# Patient Record
Sex: Female | Born: 1960 | Race: White | Hispanic: No | Marital: Married | Smoking: Never smoker
Health system: Southern US, Community
[De-identification: ages and names within clinical notes are randomized; demographics above are authoritative.]

## PROBLEM LIST (undated history)

## (undated) DIAGNOSIS — M1990 Unspecified osteoarthritis, unspecified site: Secondary | ICD-10-CM

## (undated) DIAGNOSIS — A048 Other specified bacterial intestinal infections: Secondary | ICD-10-CM

## (undated) DIAGNOSIS — T7840XA Allergy, unspecified, initial encounter: Secondary | ICD-10-CM

## (undated) DIAGNOSIS — J3089 Other allergic rhinitis: Secondary | ICD-10-CM

## (undated) DIAGNOSIS — E1169 Type 2 diabetes mellitus with other specified complication: Secondary | ICD-10-CM

## (undated) DIAGNOSIS — E669 Obesity, unspecified: Secondary | ICD-10-CM

## (undated) DIAGNOSIS — K219 Gastro-esophageal reflux disease without esophagitis: Secondary | ICD-10-CM

## (undated) HISTORY — DX: Obesity, unspecified: E66.9

## (undated) HISTORY — PX: MASTECTOMY: SHX3

## (undated) HISTORY — DX: Type 2 diabetes mellitus with other specified complication: E11.69

## (undated) HISTORY — DX: Other specified bacterial intestinal infections: A04.8

## (undated) HISTORY — PX: WISDOM TOOTH EXTRACTION: SHX21

## (undated) HISTORY — PX: CERVICAL POLYPECTOMY: SHX88

## (undated) HISTORY — DX: Gastro-esophageal reflux disease without esophagitis: K21.9

## (undated) HISTORY — DX: Allergy, unspecified, initial encounter: T78.40XA

## (undated) HISTORY — DX: Unspecified osteoarthritis, unspecified site: M19.90

---

## 2017-11-12 ENCOUNTER — Ambulatory Visit
Admission: EM | Admit: 2017-11-12 | Discharge: 2017-11-12 | Disposition: A | Discharge status: Home / Self Care | Payer: BLUE CROSS/BLUE SHIELD | Attending: Family Medicine | Admitting: Family Medicine

## 2017-11-12 ENCOUNTER — Ambulatory Visit (INDEPENDENT_AMBULATORY_CARE_PROVIDER_SITE_OTHER): Payer: BLUE CROSS/BLUE SHIELD

## 2017-11-12 ENCOUNTER — Other Ambulatory Visit: Payer: Self-pay

## 2017-11-12 DIAGNOSIS — K219 Gastro-esophageal reflux disease without esophagitis: Secondary | ICD-10-CM | POA: Diagnosis not present

## 2017-11-12 DIAGNOSIS — R000 Tachycardia, unspecified: Secondary | ICD-10-CM | POA: Diagnosis not present

## 2017-11-12 DIAGNOSIS — Z79899 Other long term (current) drug therapy: Secondary | ICD-10-CM | POA: Diagnosis not present

## 2017-11-12 DIAGNOSIS — R05 Cough: Secondary | ICD-10-CM

## 2017-11-12 DIAGNOSIS — R0789 Other chest pain: Secondary | ICD-10-CM

## 2017-11-12 DIAGNOSIS — R059 Cough, unspecified: Secondary | ICD-10-CM

## 2017-11-12 MED ORDER — OMEPRAZOLE 20 MG PO CPDR: 20.0000 mg | DELAYED_RELEASE_CAPSULE | Freq: Every day | ORAL | 0 refills | Status: DC

## 2017-11-12 MED ORDER — ALBUTEROL SULFATE HFA 108 (90 BASE) MCG/ACT IN AERS: 2.0000 | INHALATION_SPRAY | Freq: Four times a day (QID) | RESPIRATORY_TRACT | 0 refills | Status: DC | PRN

## 2017-11-12 NOTE — ED Triage Notes (Addendum)
Patient states that 2 saturdays ago she woke up with rectal pain and lasted for 30 minutes and has not returned since.  Patient states that she has noticed callous like areas on her feet that have started to burn at times.  Patient states that she has a history of reflux but has noticed that her reflux has seem to worsen around 2 weeks ago. She has only been taking zantac as needed. Reflux seems to cause a cough, feels like "bronchitis".  Patient also states that her dog has worms and wanted [place] to know in case this could be related.

## 2017-11-12 NOTE — Discharge Instructions (Addendum)
Take medication as prescribed. Rest. Drink plenty of fluids. Monitor for triggers.Take over the counter antihistamine as discussed.   Follow up with your primary care physician as soon as possible. Return to Urgent care as needed.  For any chest pain, shortness of breath, worsening concerns proceed directly to the emergency room.

## 2017-11-12 NOTE — ED Provider Notes (Signed)
[facility] ____________________________________________  Time seen: Approximately 4:45 PM  I have reviewed the triage vital signs and the nursing notes.   HISTORY  Chief Complaint Gastroesophageal Reflux  HPI Terri Tanner is a 57 y.o. female presenting for evaluation of  acid reflux complaints that is been present for the last 3 weeks.  Patient states the current sensation that she has a similar to several years ago when she was diagnosed with acid reflux.  States she was initially supposed to take Zantac every day, but states she does not like to take medicine every day and so she had not been.  States for the last 1.5 weeks has been taken Zantac every day without change in symptoms.  Patient describes current symptom as base of her throat to her lower chest and intermittent transient discomfort that she describes as "bronchial".  Patient states that the discomfort is not a sharp stabbing squeezing ache a pressure or other descriptors.  States no pain or discomfort present at all at this time.  States that she does belch frequently and states that that is been chronic without acute change.  Reports that she also does have what she describes as a chronic nonproductive hacking cough that is been present for years but has slightly increased over the last few weeks.  Also reports over the last few weeks some increase postnasal drainage particularly in the morning with an occasional wheeze that she hears first thing in the morning.  Denies any other wheezing.  No associated chest pain or shortness of breath.  States that this is similar to previous diagnosis of acid reflux.  States 2 years ago she was evaluated for acid reflux and had a negative cardiology evaluation including a negative stress test.  Not a smoker.  Denies asthma or COPD. Denies chest pain, fevers, shortness of breath, abdominal pain, or rash. Denies recent sickness. Denies recent antibiotic use.  Reports continues to eat  and drink well.  Again denies any complaints at this time.  Does report over the last few months she has had increased stress that she recently moved to [place] and is living with her daughter until her husband moves down, and states she is now having to homeschool her grandchild.  PCP: [facility]    History reviewed. No pertinent past medical history.  There are no active problems to display for this patient.   Past Surgical History:  Procedure Laterality Date  . CESAREAN SECTION    . WISDOM TOOTH EXTRACTION       No current facility-administered medications for this encounter.   Current Outpatient Medications:  .  cholecalciferol (VITAMIN D) 1000 units tablet, Take 1,000 Units by mouth daily., Disp: , Rfl:  .  ranitidine (ZANTAC) 150 MG tablet, Take 150 mg by mouth 2 (two) times daily., Disp: , Rfl:  .  albuterol (PROVENTIL HFA;VENTOLIN HFA) 108 (90 Base) MCG/ACT inhaler, Inhale 2 puffs into the lungs every 6 (six) hours as needed for wheezing., Disp: 1 Inhaler, Rfl: 0 .  omeprazole (PRILOSEC) 20 MG capsule, Take 1 capsule (20 mg total) by mouth daily., Disp: 30 capsule, Rfl: 0  Allergies Patient has no known allergies.  Family History  Problem Relation Age of Onset  . Lung cancer Mother   . Esophageal cancer Father     Social History Social History   Tobacco Use  . Smoking status: Never Smoker  . Smokeless tobacco: Never Used  Substance Use Topics  . Alcohol use: Not Currently  Comment: rarely  . Drug use: Never    Review of Systems Constitutional: No fever ENT: No sore throat. Cardiovascular: Denies chest pain. AS above.  Respiratory: Denies shortness of breath. Gastrointestinal: No abdominal pain.  No nausea, no vomiting.  No diarrhea.  No constipation. Denies bloody stools.  Musculoskeletal: Negative for back pain. Skin: Negative for rash. Neurological: Negative for headaches, focal weakness or  numbness.   ____________________________________________   PHYSICAL EXAM:  VITAL SIGNS: ED Triage Vitals  Enc Vitals Group     BP 11/12/17 1628 133/86     Pulse Rate 11/12/17 1628 (!) 111     Resp 11/12/17 1628 18     Temp 11/12/17 1628 98.6 F (37 C)     Temp Source 11/12/17 1628 Oral     SpO2 11/12/17 1628 98 %     Weight --      Height 11/12/17 1625 5\' 1"  (1.549 m)     Head Circumference --      Peak Flow --      Pain Score 11/12/17 1625 3     Pain Loc --      Pain Edu? --      Excl. in [place]? --    Constitutional: Alert and oriented. Well appearing and in no acute distress. Eyes: Conjunctivae are normal.  Head: Atraumatic. No sinus tenderness to palpation. No swelling. No erythema.  Ears: no erythema, normal TMs bilaterally.   Nose:No nasal congestion   Mouth/Throat: Mucous membranes are moist. No pharyngeal erythema. No tonsillar swelling or exudate.  Neck: No stridor.  No cervical spine tenderness to palpation. Hematological/Lymphatic/Immunilogical: No cervical lymphadenopathy. Cardiovascular: Normal rate, regular rhythm. Grossly normal heart sounds.  Good peripheral circulation. Respiratory: Normal respiratory effort.  No retractions. No wheezes, rales or rhonchi. Good air movement.  Gastrointestinal: Soft and nontender.No CVA tenderness. Musculoskeletal: Ambulatory with steady gait. No cervical, thoracic or lumbar tenderness to palpation. Chest nontender to palpation.  Neurologic:  Normal speech and language. No gait instability. Skin:  Skin appears warm, dry and intact. No rash noted. Psychiatric: Mood and affect are normal. Speech and behavior are normal.  ___________________________________________   LABS (all labs ordered are listed, but only abnormal results are displayed)  Labs Reviewed - No data to display ____________________________________________  EKG ED ECG REPORT I, Marylene Land, the attending provider, personally viewed and interpreted this  ECG.   Date: 11/12/2017  EKG Time: 1706  Rate: 104  Rhythm: sinus tachycardia  Axis: normal  Intervals:none  ST&T Change: none  RADIOLOGY  Dg Chest 2 View  Result Date: 11/12/2017 CLINICAL DATA:  Cough. EXAM: CHEST - 2 VIEW COMPARISON:  None. FINDINGS: The heart size and mediastinal contours are within normal limits. Both lungs are clear. No pneumothorax or pleural effusion is noted. The visualized skeletal structures are unremarkable. IMPRESSION: No active cardiopulmonary disease. Electronically Signed   By: Marijo Conception, M.D.   On: 11/12/2017 17:20   ____________________________________________   PROCEDURES Procedures    INITIAL IMPRESSION / ASSESSMENT AND PLAN / ED COURSE  Pertinent labs & imaging results that were available during my care of the patient were reviewed by me and considered in my medical decision making (see chart for details).  Well-appearing patient.  Patient reports vague description of transient complaints.  Patient denies any complaints at this time.  EKG  reviewed, chest x-ray negative.  Suspect combination of symptoms of acid reflux, allergies and stress.  Discussed close monitoring and for any worsening complaints proceed directly to  the emergency room.  Recommend close follow-up with primary care.  Will start on oral daily omeprazole and encourage over-the-counter Claritin.  Rx for albuterol given as needed for wheeze.  Encourage rest, fluids, supportive care.Discussed indication, risks and benefits of medications with patient.  Discussed follow up with Primary care physician this week. Discussed follow up and return parameters including no resolution or any worsening concerns. Patient verbalized understanding and agreed to plan.   ____________________________________________   FINAL CLINICAL IMPRESSION(S) / ED DIAGNOSES  Final diagnoses:  Cough  Gastroesophageal reflux disease, esophagitis presence not specified     ED Discharge Orders         Ordered    omeprazole (PRILOSEC) 20 MG capsule  Daily     11/12/17 1734    albuterol (PROVENTIL HFA;VENTOLIN HFA) 108 (90 Base) MCG/ACT inhaler  Every 6 hours PRN     11/12/17 1734       Note: This dictation was prepared with Dragon dictation along with smaller phrase technology. Any transcriptional errors that result from this process are unintentional.         Marylene Land, NP 11/12/17 1830

## 2018-02-04 ENCOUNTER — Encounter

## 2018-02-04 ENCOUNTER — Encounter: Payer: Self-pay | Admitting: Family Medicine

## 2018-02-04 ENCOUNTER — Ambulatory Visit (INDEPENDENT_AMBULATORY_CARE_PROVIDER_SITE_OTHER): Payer: BLUE CROSS/BLUE SHIELD | Admitting: Family Medicine

## 2018-02-04 VITALS — BP 108/64 | HR 100 | Temp 98.5°F | Resp 16 | Ht 62.0 in | Wt 173.4 lb

## 2018-02-04 DIAGNOSIS — K219 Gastro-esophageal reflux disease without esophagitis: Secondary | ICD-10-CM | POA: Insufficient documentation

## 2018-02-04 DIAGNOSIS — E669 Obesity, unspecified: Secondary | ICD-10-CM | POA: Diagnosis not present

## 2018-02-04 DIAGNOSIS — Z1159 Encounter for screening for other viral diseases: Secondary | ICD-10-CM

## 2018-02-04 DIAGNOSIS — R053 Chronic cough: Secondary | ICD-10-CM | POA: Insufficient documentation

## 2018-02-04 DIAGNOSIS — Z131 Encounter for screening for diabetes mellitus: Secondary | ICD-10-CM

## 2018-02-04 DIAGNOSIS — R05 Cough: Secondary | ICD-10-CM | POA: Diagnosis not present

## 2018-02-04 DIAGNOSIS — Z79899 Other long term (current) drug therapy: Secondary | ICD-10-CM

## 2018-02-04 DIAGNOSIS — Z1239 Encounter for other screening for malignant neoplasm of breast: Secondary | ICD-10-CM

## 2018-02-04 DIAGNOSIS — Z1231 Encounter for screening mammogram for malignant neoplasm of breast: Secondary | ICD-10-CM

## 2018-02-04 DIAGNOSIS — L84 Corns and callosities: Secondary | ICD-10-CM

## 2018-02-04 DIAGNOSIS — R059 Cough, unspecified: Secondary | ICD-10-CM

## 2018-02-04 DIAGNOSIS — E559 Vitamin D deficiency, unspecified: Secondary | ICD-10-CM

## 2018-02-04 DIAGNOSIS — Z13220 Encounter for screening for lipoid disorders: Secondary | ICD-10-CM

## 2018-02-04 DIAGNOSIS — Z23 Encounter for immunization: Secondary | ICD-10-CM | POA: Diagnosis not present

## 2018-02-04 MED ORDER — OMEPRAZOLE 20 MG PO CPDR: 20.0000 mg | DELAYED_RELEASE_CAPSULE | Freq: Every day | ORAL | 1 refills | Status: DC

## 2018-02-04 NOTE — Patient Instructions (Signed)

## 2018-02-04 NOTE — Progress Notes (Signed)
Name: Terri Tanner   MRN: [id number]    DOB: 1961-05-30   Date:02/04/2018       Progress Note  Subjective  Chief Complaint  Chief Complaint  Patient presents with  . Establish Care  . Labs Only    patient is not fasting    HPI  GERD: seen at Urgent Care, doing well on Omeprazole, no side effects, no heartburn, regurgitation or chest pain since started on medication. No weight loss, states had colonoscopy about 9 years ago and is due for repeat next year. No blood in stools.   Obesity: she states going out to eat more often, and thinks she has gained about 25 lbs in the past 6 months ago. Discussed life style modification   Left Foot Problems: going on for a couple of years on left foot only, around 5th toe, and two lesions on bottom of left foot, no pain associated, no oozing, gets better when she buffers the area. She would like to see podiatrist   Vitamin D deficiency: taking 4000 units daily.   Patient Active Problem List   Diagnosis Date Noted  . GERD (gastroesophageal reflux disease) 02/04/2018  . Vitamin D deficiency 02/04/2018    Past Surgical History:  Procedure Laterality Date  . CERVICAL POLYPECTOMY    . CESAREAN SECTION    . WISDOM TOOTH EXTRACTION    . WISDOM TOOTH EXTRACTION      Family History  Problem Relation Age of Onset  . Lung cancer Mother        smoked for many years  . COPD Mother   . Emphysema Mother   . Esophageal cancer Father        smoker  . Suicidality Father   . Stroke Brother   . Alcohol abuse Brother   . Other Brother        huge smoker  . Diabetes Sister     Social History   Socioeconomic History  . Marital status: Married    Spouse name: Alvester Chou  . Number of children: 1  . Years of education: Not on file  . Highest education level: High school graduate  Occupational History  . Occupation: stay at home   Social Needs  . Financial resource strain: Not hard at all  . Food insecurity:    Worry: Never true    Inability:  Never true  . Transportation needs:    Medical: No    Non-medical: No  Tobacco Use  . Smoking status: Never Smoker  . Smokeless tobacco: Never Used  Substance and Sexual Activity  . Alcohol use: Not Currently    Comment: rarely  . Drug use: Never  . Sexual activity: Not Currently    Partners: Male    Birth control/protection: None, Surgical  Lifestyle  . Physical activity:    Days per week: 0 days    Minutes per session: 0 min  . Stress: Not at all  Relationships  . Social connections:    Talks on phone: More than three times a week    Gets together: More than three times a week    Attends religious service: More than 4 times per year    Active member of club or organization: Yes    Attends meetings of clubs or organizations: 1 to 4 times per year    Relationship status: Married  . Intimate partner violence:    Fear of current or ex partner: No    Emotionally abused: No    Physically  abused: No    Forced sexual activity: No  Other Topics Concern  . Not on file  Social History Narrative   Patient  moved here from [place], [place] 07/2017   They moved in with their only daughter to home school the grandchildren     Current Outpatient Medications:  .  cholecalciferol (VITAMIN D) 1000 units tablet, Take 1,000 Units by mouth daily., Disp: , Rfl:  .  loratadine (CLARITIN) 10 MG tablet, Take 10 mg by mouth daily., Disp: , Rfl:  .  omeprazole (PRILOSEC) 20 MG capsule, Take 1 capsule (20 mg total) by mouth daily., Disp: 90 capsule, Rfl: 1  No Known Allergies   ROS  Constitutional: Negative for fever or weight change.  Respiratory: positive for cough but no  shortness of breath.   Cardiovascular: Negative for chest pain or palpitations.  Gastrointestinal: Negative for abdominal pain, no bowel changes.  Musculoskeletal: Negative for gait problem or joint swelling.  Skin: problems with foot  Neurological: Negative for dizziness or headache.  No other specific complaints  in a complete review of systems (except as listed in HPI above).  Objective  Vitals:   02/04/18 1139  BP: 108/64  Pulse: 100  Resp: 16  Temp: 98.5 F (36.9 C)  TempSrc: Oral  SpO2: 98%  Weight: 173 lb 6.4 oz (78.7 kg)  Height: 5\' 2"  (1.575 m)    Body mass index is 31.72 kg/m.  Physical Exam  Constitutional: Patient appears well-developed and well-nourished. Obese No distress.  HEENT: head atraumatic, normocephalic, pupils equal and reactive to light,  neck supple, throat within normal limits Cardiovascular: Normal rate, regular rhythm and normal heart sounds.  No murmur heard. No BLE edema. Pulmonary/Chest: Effort normal and breath sounds normal. No respiratory distress. Abdominal: Soft.  There is no tenderness. Skin: dry skin on left toe, wart like and callus type lesions on plantar aspect of left foot  Psychiatric: Patient has a normal mood and affect. behavior is normal. Judgment and thought content normal.  PHQ2/9: Depression screen PHQ 2/9 02/04/2018  Decreased Interest 0  Down, Depressed, Hopeless 0  PHQ - 2 Score 0  Altered sleeping 0  Tired, decreased energy 2  Change in appetite 1  Feeling bad or failure about yourself  0  Trouble concentrating 0  Moving slowly or fidgety/restless 0  Suicidal thoughts 0  PHQ-9 Score 3  Difficult doing work/chores Not difficult at all     Fall Risk: Fall Risk  02/04/2018  Falls in the past year? No    Functional Status Survey: Is the patient deaf or have difficulty hearing?: No Does the patient have difficulty seeing, even when wearing glasses/contacts?: No Does the patient have difficulty concentrating, remembering, or making decisions?: No Does the patient have difficulty walking or climbing stairs?: No Does the patient have difficulty dressing or bathing?: No Does the patient have difficulty doing errands alone such as visiting a doctor's office or shopping?: No    Assessment & Plan   1. Gastroesophageal  reflux disease without esophagitis  Discussed long term use of PPI and she will try to incorporate life style modification and wean self off medication  - omeprazole (PRILOSEC) 20 MG capsule; Take 1 capsule (20 mg total) by mouth daily.  Dispense: 90 capsule; Refill: 1  2. Cough in adult  Normal CXR, chronic, daughter of smokers, never smoked herself, only in ams, no sputum. Check CBC and monitor for now - CBC with Differential/Platelet Try stopping loratadine since only symptoms is  cough, and she never filled ventolin   3. Obesity (BMI 30.0-34.9)  Discussed with the patient the risk posed by an increased BMI. Discussed importance of portion control, calorie counting and at least 150 minutes of physical activity weekly. Avoid sweet beverages and drink more water. Eat at least 6 servings of fruit and vegetables daily   4. Need for Tdap vaccination  refused  5. Need for hepatitis C screening test  - Hepatitis C Antibody  6. Breast cancer screening  - MM 3D SCREEN BREAST BILATERAL; Future  7. Long-term use of high-risk medication  - COMPLETE METABOLIC PANEL WITH GFR - CBC with Differential/Platelet  8. Diabetes mellitus screening  - Hemoglobin A1c  9. Lipid screening  - Lipid panel  10. Vitamin D deficiency  - VITAMIN D 25 Hydroxy (Vit-D Deficiency, Fractures)   11. Foot callus  - Ambulatory referral to Podiatry

## 2018-02-05 LAB — COMPLETE METABOLIC PANEL WITH GFR
AG Ratio: 1.6 (calc) (ref 1.0–2.5)
ALBUMIN MSPROF: 4.4 g/dL (ref 3.6–5.1)
ALKALINE PHOSPHATASE (APISO): 95 U/L (ref 33–130)
ALT: 47 U/L — ABNORMAL HIGH (ref 6–29)
AST: 33 U/L (ref 10–35)
BILIRUBIN TOTAL: 0.4 mg/dL (ref 0.2–1.2)
BUN: 17 mg/dL (ref 7–25)
CHLORIDE: 101 mmol/L (ref 98–110)
CO2: 28 mmol/L (ref 20–32)
CREATININE: 0.81 mg/dL (ref 0.50–1.05)
Calcium: 10.1 mg/dL (ref 8.6–10.4)
GFR, EST AFRICAN AMERICAN: 93 mL/min/{1.73_m2} (ref >=60)
GFR, Est Non African American: 80 mL/min/{1.73_m2} (ref >=60)
GLOBULIN: 2.8 g/dL (ref 1.9–3.7)
GLUCOSE: 117 mg/dL (ref 65–139)
Potassium: 4.2 mmol/L (ref 3.5–5.3)
SODIUM: 137 mmol/L (ref 135–146)
TOTAL PROTEIN: 7.2 g/dL (ref 6.1–8.1)

## 2018-02-05 LAB — CBC WITH DIFFERENTIAL/PLATELET
Basophils Absolute: 72 cells/uL (ref 0–200)
Basophils Relative: 0.9 %
Eosinophils Absolute: 160 cells/uL (ref 15–500)
Eosinophils Relative: 2 %
HEMATOCRIT: 45 % (ref 35.0–45.0)
Hemoglobin: 15.3 g/dL (ref 11.7–15.5)
LYMPHS ABS: 2160 {cells}/uL (ref 850–3900)
MCH: 29.6 pg (ref 27.0–33.0)
MCHC: 34 g/dL (ref 32.0–36.0)
MCV: 87 fL (ref 80.0–100.0)
MPV: 11.5 fL (ref 7.5–12.5)
Monocytes Relative: 8.2 %
NEUTROS PCT: 61.9 %
Neutro Abs: 4952 cells/uL (ref 1500–7800)
Platelets: 271 10*3/uL (ref 140–400)
RBC: 5.17 10*6/uL — ABNORMAL HIGH (ref 3.80–5.10)
RDW: 12.5 % (ref 11.0–15.0)
TOTAL LYMPHOCYTE: 27 %
WBC: 8 10*3/uL (ref 3.8–10.8)
WBCMIX: 656 {cells}/uL (ref 200–950)

## 2018-02-05 LAB — LIPID PANEL
CHOLESTEROL: 270 mg/dL — AB (ref <200)
HDL: 46 mg/dL — ABNORMAL LOW (ref >50)
Non-HDL Cholesterol (Calc): 224 mg/dL (calc) — ABNORMAL HIGH (ref <130)
TRIGLYCERIDES: 492 mg/dL — AB (ref <150)
Total CHOL/HDL Ratio: 5.9 (calc) — ABNORMAL HIGH (ref <5.0)

## 2018-02-05 LAB — VITAMIN D 25 HYDROXY (VIT D DEFICIENCY, FRACTURES): VIT D 25 HYDROXY: 45 ng/mL (ref 30–100)

## 2018-02-05 LAB — HEMOGLOBIN A1C
EAG (MMOL/L): 8.2 (calc)
Hgb A1c MFr Bld: 6.8 % of total Hgb — ABNORMAL HIGH (ref <5.7)
MEAN PLASMA GLUCOSE: 148 (calc)

## 2018-02-05 LAB — HEPATITIS C ANTIBODY
HEP C AB: NONREACTIVE
SIGNAL TO CUT-OFF: 0.01 (ref <1.00)

## 2018-02-06 ENCOUNTER — Encounter: Payer: Self-pay | Admitting: Family Medicine

## 2018-02-06 DIAGNOSIS — E1169 Type 2 diabetes mellitus with other specified complication: Secondary | ICD-10-CM | POA: Insufficient documentation

## 2018-02-06 DIAGNOSIS — E785 Hyperlipidemia, unspecified: Secondary | ICD-10-CM

## 2018-02-06 DIAGNOSIS — E669 Obesity, unspecified: Secondary | ICD-10-CM | POA: Insufficient documentation

## 2018-02-06 HISTORY — DX: Type 2 diabetes mellitus with other specified complication: E11.69

## 2018-02-06 HISTORY — DX: Type 2 diabetes mellitus with other specified complication: E66.9

## 2018-02-09 ENCOUNTER — Encounter: Payer: Self-pay | Admitting: Family Medicine

## 2018-02-09 ENCOUNTER — Ambulatory Visit: Payer: BLUE CROSS/BLUE SHIELD | Admitting: Family Medicine

## 2018-02-09 VITALS — BP 116/74 | HR 102 | Temp 98.5°F | Resp 18 | Ht 62.0 in | Wt 168.2 lb

## 2018-02-09 DIAGNOSIS — R059 Cough, unspecified: Secondary | ICD-10-CM

## 2018-02-09 DIAGNOSIS — E1169 Type 2 diabetes mellitus with other specified complication: Secondary | ICD-10-CM | POA: Diagnosis not present

## 2018-02-09 DIAGNOSIS — R05 Cough: Secondary | ICD-10-CM

## 2018-02-09 DIAGNOSIS — E785 Hyperlipidemia, unspecified: Secondary | ICD-10-CM

## 2018-02-09 DIAGNOSIS — Z23 Encounter for immunization: Secondary | ICD-10-CM

## 2018-02-09 DIAGNOSIS — E669 Obesity, unspecified: Secondary | ICD-10-CM

## 2018-02-09 LAB — POCT UA - MICROALBUMIN: Microalbumin Ur, POC: 20 mg/L

## 2018-02-09 MED ORDER — ROSUVASTATIN CALCIUM 20 MG PO TABS: 20.0000 mg | ORAL_TABLET | Freq: Every day | ORAL | 2 refills | Status: DC

## 2018-02-09 NOTE — Progress Notes (Signed)
Name: Terri Tanner   MRN: [id number]    DOB: [birth year]   Date:02/09/2018       Progress Note  Subjective  Chief Complaint  Chief Complaint  Patient presents with  . Labs Only    To discuss blood work results    HPI  Diabetes Type II with obesity and dyslipidemia: she has nocturia usually once for the past few years, but denies frequency, polyphagia or polydipsia. She has changed her diet since she got lab results last week. She has lost a few pounds and is motivated to continue life style modification prior to starting medications. Discussed lab results today with her, explained the dyslipidemia and importance to start aspirin 81 mg and also Crestor. Discussed possible side effects, we will monitor triglycerides level since it should improve when diabetes improves. She states oldest sister had DM. Eye exam is already scheduled for Nov   Chronic cough: weaning self off omeprazole since is not helping and she does not have any other symptoms of reflux, she also stopped loratadine, she does not have diagnosis of asthma or COPD. She was second smoker ( father and mother). Cough is usually in am's and occasionally after a meals, she feels like something is stuck in her throat, she coughs and clears her throat and resolves, never productive, CXR done recently was normal, we will check spirometry    Patient Active Problem List   Diagnosis Date Noted  . Diabetes mellitus type 2 in obese ([place]) 02/06/2018  . GERD (gastroesophageal reflux disease) 02/04/2018  . Vitamin D deficiency 02/04/2018  . Chronic cough 02/04/2018  . Obesity (BMI 30-39.9) 02/04/2018    Past Surgical History:  Procedure Laterality Date  . CERVICAL POLYPECTOMY    . CESAREAN SECTION    . WISDOM TOOTH EXTRACTION    . WISDOM TOOTH EXTRACTION      Family History  Problem Relation Age of Onset  . Lung cancer Mother        smoked for many years  . COPD Mother   . Emphysema Mother   . Esophageal cancer Father    smoker  . Suicidality Father   . Stroke Brother   . Alcohol abuse Brother   . Other Brother        huge smoker  . Diabetes Sister     Social History   Socioeconomic History  . Marital status: Married    Spouse name: Alvester Chou  . Number of children: 1  . Years of education: Not on file  . Highest education level: High school graduate  Occupational History  . Occupation: stay at home   Social Needs  . Financial resource strain: Not hard at all  . Food insecurity:    Worry: Never true    Inability: Never true  . Transportation needs:    Medical: No    Non-medical: No  Tobacco Use  . Smoking status: Never Smoker  . Smokeless tobacco: Never Used  Substance and Sexual Activity  . Alcohol use: Not Currently    Comment: rarely  . Drug use: Never  . Sexual activity: Not Currently    Partners: Male    Birth control/protection: None, Surgical  Lifestyle  . Physical activity:    Days per week: 0 days    Minutes per session: 0 min  . Stress: Not at all  Relationships  . Social connections:    Talks on phone: More than three times a week    Gets together: More than three times  a week    Attends religious service: More than 4 times per year    Active member of club or organization: Yes    Attends meetings of clubs or organizations: 1 to 4 times per year    Relationship status: Married  . Intimate partner violence:    Fear of current or ex partner: No    Emotionally abused: No    Physically abused: No    Forced sexual activity: No  Other Topics Concern  . Not on file  Social History Narrative   Patient  moved here from [place], [place] 07/2017   They moved in with their only daughter to home school the grandchildren     Current Outpatient Medications:  .  cholecalciferol (VITAMIN D) 1000 units tablet, Take 1,000 Units by mouth daily., Disp: , Rfl:  .  rosuvastatin (CRESTOR) 20 MG tablet, Take 1 tablet (20 mg total) by mouth daily. Start with half first week after that  one pill, Disp: 30 tablet, Rfl: 2  No Known Allergies   ROS  Constitutional: Negative for fever , positive for weight change.  Respiratory: positive  for cough , but no shortness of breath.   Cardiovascular: Intermittent  for chest pain ( evaluated 2 years ago with normal stress test) described as soreness on right side,but can move around anterior chest but no  palpitations. EKG done at urgent care this year Gastrointestinal: Negative for abdominal pain, no bowel changes.  Musculoskeletal: Negative for gait problem or joint swelling.  Skin: Negative for rash.  Neurological: Negative for dizziness or headache.  No other specific complaints in a complete review of systems (except as listed in HPI above).  Objective  Vitals:   02/09/18 1052  BP: 116/74  Pulse: (!) 102  Resp: 18  Temp: 98.5 F (36.9 C)  TempSrc: Oral  SpO2: 99%  Weight: 168 lb 3.2 oz (76.3 kg)  Height: 5\' 2"  (1.575 m)    Body mass index is 30.76 kg/m.  Physical Exam  Constitutional: Patient appears well-developed and well-nourished. Obese No distress.  HEENT: head atraumatic, normocephalic, pupils equal and reactive to light, neck supple, throat within normal limits Cardiovascular: Normal rate, regular rhythm and normal heart sounds.  No murmur heard. No BLE edema. Pulmonary/Chest: Effort normal and breath sounds normal. No respiratory distress. Abdominal: Soft.  There is no tenderness. Psychiatric: Patient has a normal mood and affect. behavior is normal. Judgment and thought content normal.  Recent Results (from the past 2160 hour(s))  Hemoglobin A1c     Status: Abnormal   Collection Time: 02/04/18 12:40 PM  Result Value Ref Range   Hgb A1c MFr Bld 6.8 (H) <5.7 % of total Hgb    Comment: For someone without known diabetes, a hemoglobin A1c value of 6.5% or greater indicates that they may have  diabetes and this should be confirmed with a follow-up  test. . For someone with known diabetes, a value  <7% indicates  that their diabetes is well controlled and a value  greater than or equal to 7% indicates suboptimal  control. A1c targets should be individualized based on  duration of diabetes, age, comorbid conditions, and  other considerations. . Currently, no consensus exists regarding use of hemoglobin A1c for diagnosis of diabetes for children. .    Mean Plasma Glucose 148 (calc)   eAG (mmol/L) 8.2 (calc)  Lipid panel     Status: Abnormal   Collection Time: 02/04/18 12:40 PM  Result Value Ref Range   Cholesterol  270 (H) <200 mg/dL   HDL 46 (L) >50 mg/dL   Triglycerides 492 (H) <150 mg/dL    Comment: . If a non-fasting specimen was collected, consider repeat triglyceride testing on a fasting specimen if clinically indicated.  Yates Decamp et al. J. of Clin. Lipidol. 1610;9:604-540. Marland Kitchen    LDL Cholesterol (Calc)  mg/dL (calc)    Comment: . LDL cholesterol not calculated. Triglyceride levels greater than 400 mg/dL invalidate calculated LDL results. . Reference range: <100 . Desirable range <100 mg/dL for primary prevention;   <70 mg/dL for patients with CHD or diabetic patients  with > or = 2 CHD risk factors. Marland Kitchen LDL-C is now calculated using the Martin-Hopkins  calculation, which is a validated novel method providing  better accuracy than the Friedewald equation in the  estimation of LDL-C.  Cresenciano Genre et al. Annamaria Helling. 9811;914(78): 2061-2068  (http://education.[facility].com/faq/FAQ164)    Total CHOL/HDL Ratio 5.9 (H) <5.0 (calc)   Non-HDL Cholesterol (Calc) 224 (H) <130 mg/dL (calc)    Comment: Non-HDL level > or = 220 is very high and may indicate  genetic familial hypercholesterolemia (FH). Clinical  assessment and measurement of blood lipid levels  should be considered for all first-degree relatives  of patients with an FH diagnosis. . For patients with diabetes plus 1 major ASCVD risk  factor, treating to a non-HDL-C goal of <100 mg/dL  (LDL-C of <70 mg/dL)  is considered a therapeutic  option.   COMPLETE METABOLIC PANEL WITH GFR     Status: Abnormal   Collection Time: 02/04/18 12:40 PM  Result Value Ref Range   Glucose, Bld 117 65 - 139 mg/dL    Comment: .        Non-fasting reference interval .    BUN 17 7 - 25 mg/dL   Creat 0.81 0.50 - 1.05 mg/dL    Comment: For patients >57 years of age, the reference limit for Creatinine is approximately 13% higher for people identified as African-American. .    GFR, Est Non African American 80 > OR = 60 mL/min/1.14m2   GFR, Est African American 93 > OR = 60 mL/min/1.24m2   BUN/Creatinine Ratio NOT APPLICABLE 6 - 22 (calc)   Sodium 137 135 - 146 mmol/L   Potassium 4.2 3.5 - 5.3 mmol/L   Chloride 101 98 - 110 mmol/L   CO2 28 20 - 32 mmol/L   Calcium 10.1 8.6 - 10.4 mg/dL   Total Protein 7.2 6.1 - 8.1 g/dL   Albumin 4.4 3.6 - 5.1 g/dL   Globulin 2.8 1.9 - 3.7 g/dL (calc)   AG Ratio 1.6 1.0 - 2.5 (calc)   Total Bilirubin 0.4 0.2 - 1.2 mg/dL   Alkaline phosphatase (APISO) 95 33 - 130 U/L   AST 33 10 - 35 U/L   ALT 47 (H) 6 - 29 U/L  CBC with Differential/Platelet     Status: Abnormal   Collection Time: 02/04/18 12:40 PM  Result Value Ref Range   WBC 8.0 3.8 - 10.8 Thousand/uL   RBC 5.17 (H) 3.80 - 5.10 Million/uL   Hemoglobin 15.3 11.7 - 15.5 g/dL   HCT 45.0 35.0 - 45.0 %   MCV 87.0 80.0 - 100.0 fL   MCH 29.6 27.0 - 33.0 pg   MCHC 34.0 32.0 - 36.0 g/dL   RDW 12.5 11.0 - 15.0 %   Platelets 271 140 - 400 Thousand/uL   MPV 11.5 7.5 - 12.5 fL   Neutro Abs 4,952 1,500 - 7,800 cells/uL  Lymphs Abs 2,160 850 - 3,900 cells/uL   WBC mixed population 656 200 - 950 cells/uL   Eosinophils Absolute 160 15 - 500 cells/uL   Basophils Absolute 72 0 - 200 cells/uL   Neutrophils Relative % 61.9 %   Total Lymphocyte 27.0 %   Monocytes Relative 8.2 %   Eosinophils Relative 2.0 %   Basophils Relative 0.9 %  Hepatitis C Antibody     Status: None   Collection Time: 02/04/18 12:40 PM  Result Value  Ref Range   Hepatitis C Ab NON-REACTIVE NON-REACTI   SIGNAL TO CUT-OFF 0.01 <1.00    Comment: . HCV antibody was non-reactive. There is no laboratory  evidence of HCV infection. . In most cases, no further action is required. However, if recent HCV exposure is suspected, a test for HCV RNA (test code [phone]) is suggested. . For additional information please refer to http://education.[facility].com/faq/FAQ22v1 (This link is being provided for informational/ educational purposes only.) .   VITAMIN D 25 Hydroxy (Vit-D Deficiency, Fractures)     Status: None   Collection Time: 02/04/18 12:40 PM  Result Value Ref Range   Vit D, 25-Hydroxy 45 30 - 100 ng/mL    Comment: Vitamin D Status         25-OH Vitamin D: . Deficiency:                    <20 ng/mL Insufficiency:             20 - 29 ng/mL Optimal:                 > or = 30 ng/mL . For 25-OH Vitamin D testing on patients on  D2-supplementation and patients for whom quantitation  of D2 and D3 fractions is required, the QuestAssureD(TM) 25-OH VIT D, (D2,D3), LC/MS/MS is recommended: order  code [phone] (patients >70yrs). . For more information on this test, go to: http://education.[facility].com/faq/FAQ163 (This link is being provided for  informational/educational purposes only.)   POCT UA - Microalbumin     Status: Normal   Collection Time: 02/09/18 11:52 AM  Result Value Ref Range   Microalbumin Ur, POC 20 mg/L   Creatinine, POC     Albumin/Creatinine Ratio, Urine, POC      Diabetic Foot Exam: Diabetic Foot Exam - Simple   Simple Foot Form Diabetic Foot exam was performed with the following findings:  Yes 02/09/2018  1:15 PM  Visual Inspection See comments:  Yes Sensation Testing Intact to touch and monofilament testing bilaterally:  Yes Pulse Check Posterior Tibialis and Dorsalis pulse intact bilaterally:  Yes Comments Callus formation       PHQ2/9: Depression screen PHQ 2/9 02/04/2018  Decreased  Interest 0  Down, Depressed, Hopeless 0  PHQ - 2 Score 0  Altered sleeping 0  Tired, decreased energy 2  Change in appetite 1  Feeling bad or failure about yourself  0  Trouble concentrating 0  Moving slowly or fidgety/restless 0  Suicidal thoughts 0  PHQ-9 Score 3  Difficult doing work/chores Not difficult at all     Fall Risk: Fall Risk  02/04/2018  Falls in the past year? No     Assessment & Plan  1. Diabetes mellitus type 2 in obese ([facility])  - POCT UA - Microalbumin Offered referral to dietician but she wants to do some research on her own   2. Dyslipidemia associated with type 2 diabetes mellitus ([facility])  - rosuvastatin (CRESTOR) 20 MG tablet; Take 1  tablet (20 mg total) by mouth daily. Start with half first week after that one pill  Dispense: 30 tablet; Refill: 2  3. Need for vaccination for pneumococcus  - Pneumococcal polysaccharide vaccine 23-valent greater than or equal to 2yo subcutaneous/IM  4. Need for Tdap vaccination  Refused   5. Needs flu shot  refused  6. Need for shingles vaccine  refused  7. Cough in adult  Spirometry pre was normal, she did not get better with PPI and or allergy medication, discussed referral to pulmonologist but she would like to hold off for now.

## 2018-02-12 ENCOUNTER — Encounter: Payer: Self-pay | Admitting: Family Medicine

## 2018-02-12 DIAGNOSIS — Z1211 Encounter for screening for malignant neoplasm of colon: Secondary | ICD-10-CM

## 2018-02-22 ENCOUNTER — Other Ambulatory Visit: Payer: Self-pay | Admitting: Family Medicine

## 2018-02-22 DIAGNOSIS — Z1211 Encounter for screening for malignant neoplasm of colon: Secondary | ICD-10-CM

## 2018-02-22 NOTE — Telephone Encounter (Signed)
Copied from [place] [id number]. Topic: Inquiry >> Feb 22, 2018 10:24 AM Nils Flack wrote: Reason for CRM: ct virtual colonoscopy was declined per [facility].  They sent fax directly to the office.

## 2018-02-23 ENCOUNTER — Other Ambulatory Visit: Payer: Self-pay | Admitting: Family Medicine

## 2018-02-23 ENCOUNTER — Other Ambulatory Visit: Payer: Self-pay

## 2018-02-23 DIAGNOSIS — Z1211 Encounter for screening for malignant neoplasm of colon: Secondary | ICD-10-CM

## 2018-02-23 MED ORDER — NA SULFATE-K SULFATE-MG SULF 17.5-3.13-1.6 GM/177ML PO SOLN: 1.0000 | Freq: Once | ORAL | 0 refills | Status: AC

## 2018-02-28 ENCOUNTER — Other Ambulatory Visit: Payer: Self-pay

## 2018-02-28 ENCOUNTER — Ambulatory Visit
Admission: RE | Admit: 2018-02-28 | Discharge: 2018-02-28 | Disposition: A | Discharge status: Home / Self Care | Payer: BLUE CROSS/BLUE SHIELD | Source: Ambulatory Visit | Attending: Family Medicine | Admitting: Family Medicine

## 2018-02-28 DIAGNOSIS — Z1231 Encounter for screening mammogram for malignant neoplasm of breast: Secondary | ICD-10-CM | POA: Diagnosis present

## 2018-02-28 DIAGNOSIS — Z1239 Encounter for other screening for malignant neoplasm of breast: Secondary | ICD-10-CM

## 2018-02-28 MED ORDER — PEG 3350-KCL-NA BICARB-NACL 420 G PO SOLR: 4000.0000 mL | Freq: Once | ORAL | 0 refills | Status: AC

## 2018-03-01 ENCOUNTER — Encounter: Payer: Self-pay | Admitting: Podiatry

## 2018-03-01 ENCOUNTER — Inpatient Hospital Stay
Admission: RE | Admit: 2018-03-01 | Discharge: 2018-03-01 | Disposition: A | Discharge status: Home / Self Care | Payer: Self-pay | Source: Ambulatory Visit | Attending: *Deleted | Admitting: *Deleted

## 2018-03-01 ENCOUNTER — Other Ambulatory Visit: Payer: Self-pay | Admitting: *Deleted

## 2018-03-01 ENCOUNTER — Ambulatory Visit: Payer: BLUE CROSS/BLUE SHIELD | Admitting: Podiatry

## 2018-03-01 DIAGNOSIS — Z9289 Personal history of other medical treatment: Secondary | ICD-10-CM

## 2018-03-01 DIAGNOSIS — B079 Viral wart, unspecified: Secondary | ICD-10-CM | POA: Diagnosis not present

## 2018-03-01 MED ORDER — NONFORMULARY OR COMPOUNDED ITEM: 2 refills | Status: DC

## 2018-03-02 ENCOUNTER — Other Ambulatory Visit: Payer: Self-pay | Admitting: Family Medicine

## 2018-03-02 ENCOUNTER — Encounter: Payer: Self-pay | Admitting: *Deleted

## 2018-03-02 DIAGNOSIS — N6320 Unspecified lump in the left breast, unspecified quadrant: Secondary | ICD-10-CM

## 2018-03-02 DIAGNOSIS — R928 Other abnormal and inconclusive findings on diagnostic imaging of breast: Secondary | ICD-10-CM

## 2018-03-03 ENCOUNTER — Encounter
Admission: RE | Disposition: A | Discharge status: Home / Self Care | Payer: Self-pay | Source: Ambulatory Visit | Attending: Gastroenterology

## 2018-03-03 ENCOUNTER — Ambulatory Visit
Admission: RE | Admit: 2018-03-03 | Discharge: 2018-03-03 | Disposition: A | Discharge status: Home / Self Care | Payer: BLUE CROSS/BLUE SHIELD | Source: Ambulatory Visit | Attending: Gastroenterology | Admitting: Gastroenterology

## 2018-03-03 ENCOUNTER — Ambulatory Visit: Payer: BLUE CROSS/BLUE SHIELD | Admitting: Anesthesiology

## 2018-03-03 DIAGNOSIS — K219 Gastro-esophageal reflux disease without esophagitis: Secondary | ICD-10-CM | POA: Insufficient documentation

## 2018-03-03 DIAGNOSIS — Z79899 Other long term (current) drug therapy: Secondary | ICD-10-CM | POA: Diagnosis not present

## 2018-03-03 DIAGNOSIS — E119 Type 2 diabetes mellitus without complications: Secondary | ICD-10-CM | POA: Diagnosis not present

## 2018-03-03 DIAGNOSIS — Z1211 Encounter for screening for malignant neoplasm of colon: Secondary | ICD-10-CM

## 2018-03-03 DIAGNOSIS — D490 Neoplasm of unspecified behavior of digestive system: Secondary | ICD-10-CM | POA: Diagnosis not present

## 2018-03-03 DIAGNOSIS — M19041 Primary osteoarthritis, right hand: Secondary | ICD-10-CM | POA: Insufficient documentation

## 2018-03-03 HISTORY — PX: COLONOSCOPY WITH PROPOFOL: SHX5780

## 2018-03-03 SURGERY — COLONOSCOPY WITH PROPOFOL
Anesthesia: General

## 2018-03-03 MED ORDER — PROPOFOL 10 MG/ML IV BOLUS
INTRAVENOUS | Status: DC | PRN
  Administered 2018-03-03: 20 mg via INTRAVENOUS
  Administered 2018-03-03: 50 mg via INTRAVENOUS

## 2018-03-03 MED ORDER — LIDOCAINE HCL (PF) 2 % IJ SOLN
INTRAMUSCULAR | Status: DC | PRN
  Administered 2018-03-03: 70 mg via INTRADERMAL

## 2018-03-03 MED ORDER — PROPOFOL 500 MG/50ML IV EMUL
INTRAVENOUS | Status: AC
  Filled 2018-03-03: qty 50

## 2018-03-03 MED ORDER — PROPOFOL 500 MG/50ML IV EMUL
INTRAVENOUS | Status: DC | PRN
  Administered 2018-03-03: 100 ug/kg/min via INTRAVENOUS
  Administered 2018-03-03: 120 ug/kg/min via INTRAVENOUS

## 2018-03-03 MED ORDER — LIDOCAINE HCL (PF) 2 % IJ SOLN
INTRAMUSCULAR | Status: AC
  Filled 2018-03-03: qty 30

## 2018-03-03 MED ORDER — LIDOCAINE HCL (PF) 2 % IJ SOLN
INTRAMUSCULAR | Status: AC
  Filled 2018-03-03: qty 40

## 2018-03-03 MED ORDER — SODIUM CHLORIDE 0.9 % IV SOLN
INTRAVENOUS | Status: DC
  Administered 2018-03-03: 1000 mL via INTRAVENOUS
  Administered 2018-03-03: 11:00:00 via INTRAVENOUS

## 2018-03-03 MED ORDER — PROPOFOL 10 MG/ML IV BOLUS
INTRAVENOUS | Status: AC
  Filled 2018-03-03: qty 20

## 2018-03-03 NOTE — Anesthesia Postprocedure Evaluation (Signed)
Anesthesia Post Note  Patient: Terri Tanner  Procedure(s) Performed: COLONOSCOPY WITH PROPOFOL (N/A )  Patient location during evaluation: PACU Anesthesia Type: General Level of consciousness: awake and alert Pain management: pain level controlled Vital Signs Assessment: post-procedure vital signs reviewed and stable Respiratory status: spontaneous breathing, nonlabored ventilation, respiratory function stable and patient connected to nasal cannula oxygen Cardiovascular status: blood pressure returned to baseline and stable Postop Assessment: no apparent nausea or vomiting Anesthetic complications: no     Last Vitals:  Vitals:   03/03/18 1034 03/03/18 1136  BP: 126/74 (!) 90/51  Pulse: 99   Resp: 20   Temp: 36.4 C (!) 35.9 C  SpO2: 100%     Last Pain:  Vitals:   03/03/18 1136  TempSrc: Tympanic  PainSc:                  Durenda Hurt

## 2018-03-03 NOTE — Anesthesia Post-op Follow-up Note (Signed)
Anesthesia QCDR form completed.        

## 2018-03-03 NOTE — Op Note (Signed)
[facility] Patient Name: Terri Tanner Procedure Date: 03/03/2018 11:09 AM MRN: [id number] Account #: [id number] Date of Birth: 09/15/1960 Admit Type: Outpatient Age: 57 Room: [facility] ENDO ROOM 4 Gender: Female Note Status: Finalized Procedure:            Colonoscopy Indications:          Screening for colorectal malignant neoplasm Providers:            Jonathon Bellows MD, MD Referring MD:         Bethena Roys. Sowles, MD (Referring MD) Medicines:            Monitored Anesthesia Care Complications:        No immediate complications. Procedure:            Pre-Anesthesia Assessment:                       - Prior to the procedure, a History and Physical was                        performed, and patient medications, allergies and                        sensitivities were reviewed. The patient's tolerance of                        previous anesthesia was reviewed.                       - The risks and benefits of the procedure and the                        sedation options and risks were discussed with the                        patient. All questions were answered and informed                        consent was obtained.                       - ASA Grade Assessment: II - A patient with mild                        systemic disease.                       After obtaining informed consent, the colonoscope was                        passed under direct vision. Throughout the procedure,                        the patient's blood pressure, pulse, and oxygen                        saturations were monitored continuously. The                        Colonoscope was introduced through the anus and  advanced to the the cecum, identified by the                        appendiceal orifice, IC valve and transillumination.                        The colonoscopy was performed with ease. The patient                        tolerated the procedure well. The  quality of the bowel                        preparation was good. Findings:      The perianal exam findings include polypoid lesion which was       pedunculated over a hemorroid, at the anus prolapsing into the rectum ,       probably 1 cm sized polyp . Did not excise      A 10 mm polypoid lesion was found at the anus. The lesion was       pedunculated. No bleeding was present. It was pedunculated over a       hemorroid, at the anus prolapsing into the rectum , probably 1 cm sized       polyp . Did not excise, seen on retroflexion      The exam was otherwise without abnormality. Impression:           - Abnormal perianal exam.                       - Polypoid lesion at the anus.                       - The examination was otherwise normal.                       - No specimens collected. Recommendation:       - Discharge patient to home (with escort).                       - Resume previous diet.                       - Continue present medications.                       - Refer to Dr Dahlia Byes for polypoid lesion over the anus Procedure Code(s):    --- Professional ---                       [phone], Colonoscopy, flexible; diagnostic, including                        collection of specimen(s) by brushing or washing, when                        performed (separate procedure) Diagnosis Code(s):    --- Professional ---                       Z12.11, Encounter for screening for malignant neoplasm  of colon                       D49.0, Neoplasm of unspecified behavior of digestive                        system CPT copyright 2017 American Medical Association. All rights reserved. The codes documented in this report are preliminary and upon coder review may  be revised to meet current compliance requirements. Jonathon Bellows, MD Jonathon Bellows MD, MD 03/03/2018 11:35:52 AM This report has been signed electronically. Number of Addenda: 0 Note Initiated On: 03/03/2018 11:09 AM Scope  Withdrawal Time: 0 hours 11 minutes 56 seconds  Total Procedure Duration: 0 hours 17 minutes 17 seconds       [facility]

## 2018-03-03 NOTE — H&P (Signed)
Jonathon Bellows, MD [address] [place], [place], [place], [address] [address], [place], [place], [place], [address] Phone: [phone]  Fax: [phone]  Primary Care Physician:  Steele Sizer, MD   Pre-Procedure History & Physical: HPI:  Terri Tanner is a 57 y.o. female is here for an colonoscopy.   Past Medical History:  Diagnosis Date  . Allergy   . Arthritis    just started in the right hand  . Diabetes mellitus type 2 in obese ([place]) 02/06/2018  . GERD (gastroesophageal reflux disease)     Past Surgical History:  Procedure Laterality Date  . ABDOMINAL HYSTERECTOMY    . CERVICAL POLYPECTOMY    . CESAREAN SECTION    . WISDOM TOOTH EXTRACTION    . WISDOM TOOTH EXTRACTION      Prior to Admission medications   Medication Sig Start Date End Date Taking? Authorizing Provider  cholecalciferol (VITAMIN D) 1000 units tablet Take 1,000 Units by mouth daily.   Yes [provider]  loratadine (CLARITIN) 10 MG tablet Take 10 mg by mouth daily.   Yes [provider]  NONFORMULARY OR COMPOUNDED ITEM See phamrayc 03/01/18  Yes Edrick Kins, DPM  rosuvastatin (CRESTOR) 20 MG tablet Take 1 tablet (20 mg total) by mouth daily. Start with half first week after that one pill 02/09/18  Yes Steele Sizer, MD    Allergies as of 02/23/2018  . (No Known Allergies)    Family History  Problem Relation Age of Onset  . Lung cancer Mother        smoked for many years  . COPD Mother   . Emphysema Mother   . Esophageal cancer Father        smoker  . Suicidality Father   . Stroke Brother   . Alcohol abuse Brother   . Other Brother        huge smoker  . Diabetes Sister   . Breast cancer Neg Hx     Social History   Socioeconomic History  . Marital status: Married    Spouse name: Alvester Chou  . Number of children: 1  . Years of education: Not on file  . Highest education level: High school graduate  Occupational History  . Occupation: stay at home     Social Needs  . Financial resource strain: Not hard at all  . Food insecurity:    Worry: Never true    Inability: Never true  . Transportation needs:    Medical: No    Non-medical: No  Tobacco Use  . Smoking status: Never Smoker  . Smokeless tobacco: Never Used  Substance and Sexual Activity  . Alcohol use: Not Currently    Comment: rarely  . Drug use: Never  . Sexual activity: Not Currently    Partners: Male    Birth control/protection: None, Surgical  Lifestyle  . Physical activity:    Days per week: 0 days    Minutes per session: 0 min  . Stress: Not at all  Relationships  . Social connections:    Talks on phone: More than three times a week    Gets together: More than three times a week    Attends religious service: More than 4 times per year    Active member of club or organization: Yes    Attends meetings of clubs or organizations: 1 to 4 times per year    Relationship status: Married  . Intimate partner violence:  Fear of current or ex partner: No    Emotionally abused: No    Physically abused: No    Forced sexual activity: No  Other Topics Concern  . Not on file  Social History Narrative   Patient  moved here from [place], [place] 07/2017   They moved in with their only daughter to home school the grandchildren    Review of Systems: See HPI, otherwise negative ROS  Physical Exam: BP 126/74   Pulse 99   Temp 97.6 F (36.4 C) (Tympanic)   Resp 20   Ht 5\' 3"  (1.6 m)   Wt 73.9 kg   SpO2 100%   BMI 28.87 kg/m  General:   Alert,  pleasant and cooperative in NAD Head:  Normocephalic and atraumatic. Neck:  Supple; no masses or thyromegaly. Lungs:  Clear throughout to auscultation, normal respiratory effort.    Heart:  +S1, +S2, Regular rate and rhythm, No edema. Abdomen:  Soft, nontender and nondistended. Normal bowel sounds, without guarding, and without rebound.   Neurologic:  Alert and  oriented x4;  grossly normal  neurologically.  Impression/Plan: NARIAH MORGANO is here for an colonoscopy to be performed for Screening colonoscopy average risk   Risks, benefits, limitations, and alternatives regarding  colonoscopy have been reviewed with the patient.  Questions have been answered.  All parties agreeable.   Jonathon Bellows, MD  03/03/2018, 11:00 AM

## 2018-03-03 NOTE — Anesthesia Preprocedure Evaluation (Addendum)
Anesthesia Evaluation  Patient identified by MRN, date of birth, ID band Patient awake    Reviewed: Allergy & Precautions, H&P , NPO status , Patient's Chart, lab work & pertinent test results  Airway Mallampati: II  TM Distance: >3 FB Neck ROM: full    Dental  (+) Teeth Intact   Pulmonary neg pulmonary ROS,    breath sounds clear to auscultation       Cardiovascular negative cardio ROS   Rhythm:regular Rate:Normal     Neuro/Psych negative neurological ROS  negative psych ROS   GI/Hepatic Neg liver ROS, GERD  ,  Endo/Other  diabetes  Renal/GU negative Renal ROS  negative genitourinary   Musculoskeletal   Abdominal   Peds  Hematology negative hematology ROS (+)   Anesthesia Other Findings Past Medical History: No date: Allergy No date: Arthritis     Comment:  just started in the right hand 02/06/2018: Diabetes mellitus type 2 in obese (HCC) No date: GERD (gastroesophageal reflux disease)  Past Surgical History: No date: ABDOMINAL HYSTERECTOMY No date: CERVICAL POLYPECTOMY No date: CESAREAN SECTION No date: WISDOM TOOTH EXTRACTION No date: WISDOM TOOTH EXTRACTION  BMI    Body Mass Index:  28.87 kg/m      Reproductive/Obstetrics negative OB ROS                           Anesthesia Physical Anesthesia Plan  ASA: II  Anesthesia Plan: General   Post-op Pain Management:    Induction:   PONV Risk Score and Plan: Propofol infusion and TIVA  Airway Management Planned: Natural Airway and Nasal Cannula  Additional Equipment:   Intra-op Plan:   Post-operative Plan:   Informed Consent: I have reviewed the patients History and Physical, chart, labs and discussed the procedure including the risks, benefits and alternatives for the proposed anesthesia with the patient or authorized representative who has indicated his/her understanding and acceptance.   Dental Advisory  Given  Plan Discussed with: Anesthesiologist, CRNA and Surgeon  Anesthesia Plan Comments:         Anesthesia Quick Evaluation

## 2018-03-03 NOTE — Transfer of Care (Signed)
Immediate Anesthesia Transfer of Care Note  Patient: Terri Tanner  Procedure(s) Performed: COLONOSCOPY WITH PROPOFOL (N/A )  Patient Location: PACU  Anesthesia Type:General  Level of Consciousness: sedated  Airway & Oxygen Therapy: Patient Spontanous Breathing and Patient connected to nasal cannula oxygen  Post-op Assessment: Report given to RN  Post vital signs: Reviewed and stable  Last Vitals:  Vitals Value Taken Time  BP 90/51 03/03/2018 11:36 AM  Temp 35.9 C 03/03/2018 11:36 AM  Pulse 91 03/03/2018 11:37 AM  Resp 19 03/03/2018 11:37 AM  SpO2 96 % 03/03/2018 11:37 AM  Vitals shown include unvalidated device data.  Last Pain:  Vitals:   03/03/18 1136  TempSrc: Tympanic  PainSc:          Complications: No apparent anesthesia complications

## 2018-03-04 ENCOUNTER — Telehealth: Payer: Self-pay

## 2018-03-04 ENCOUNTER — Other Ambulatory Visit: Payer: Self-pay

## 2018-03-04 DIAGNOSIS — K629 Disease of anus and rectum, unspecified: Secondary | ICD-10-CM

## 2018-03-04 DIAGNOSIS — D490 Neoplasm of unspecified behavior of digestive system: Secondary | ICD-10-CM

## 2018-03-04 NOTE — Telephone Encounter (Signed)
Spoke to pt and informed her of her colonoscopy results and Dr. Georgeann Oppenheim instructions for a referral to general surgery. Pt is aware that she has been referred to [facility] with Dr. Dahlia Byes.

## 2018-03-04 NOTE — Telephone Encounter (Signed)
-----   Message from Jonathon Bellows, MD sent at 03/03/2018 11:37 AM EDT ----- Regarding: please arrange appointment  Terri Tanner Please arrange referral to Dr Dahlia Byes  for excision of polyp over anus seen on colonoscopy    Regards    Dr Jonathon Bellows  Gastroenterology/Hepatology Pager: [phone]

## 2018-03-07 ENCOUNTER — Encounter: Payer: Self-pay | Admitting: Gastroenterology

## 2018-03-08 ENCOUNTER — Ambulatory Visit
Admission: RE | Admit: 2018-03-08 | Discharge: 2018-03-08 | Disposition: A | Discharge status: Home / Self Care | Payer: BLUE CROSS/BLUE SHIELD | Source: Ambulatory Visit | Attending: Family Medicine | Admitting: Family Medicine

## 2018-03-08 DIAGNOSIS — N6320 Unspecified lump in the left breast, unspecified quadrant: Secondary | ICD-10-CM

## 2018-03-08 DIAGNOSIS — R928 Other abnormal and inconclusive findings on diagnostic imaging of breast: Secondary | ICD-10-CM

## 2018-03-08 NOTE — Progress Notes (Signed)
   HPI: 57 year old female presents the office today for evaluation of possible warts to multiple areas of the left foot.  She occasionally experiences pain associated with the lesions.  Patient has tried essential oils to help alleviate symptoms and resolve the lesions without any improvement.  She presents today for further treatment evaluation  Past Medical History:  Diagnosis Date  . Allergy   . Arthritis    just started in the right hand  . Diabetes mellitus type 2 in obese ([place]) 02/06/2018  . GERD (gastroesophageal reflux disease)      Physical Exam: General: The patient is alert and oriented x3 in no acute distress.  Dermatology: Superficial hyperkeratotic verruca lesions noted x4 to the left plantar foot.  Positive Auspitz sign and pinpoint bleeding.  Skin is warm, dry and supple bilateral lower extremities. Negative for open lesions or macerations.  Vascular: Palpable pedal pulses bilaterally. No edema or erythema noted. Capillary refill within normal limits.  Neurological: Epicritic and protective threshold grossly intact bilaterally.   Musculoskeletal Exam: Range of motion within normal limits to all pedal and ankle joints bilateral. Muscle strength 5/5 in all groups bilateral.   Assessment: 1.  Superficial spreading plantar verruca x4 left foot   Plan of Care:  1. Patient evaluated.  2.  Prescription for [employer] wart cream.  Due to the superficial spreading nature of the verruca lesions I do not believe that Cantharone or excisional debridement would be of any benefit. 3.  Return to clinic in 3 months      Edrick Kins, DPM [facility]  Dr. Edrick Kins, DPM    [address], [place] [address]                Office [phone]  Fax [phone]

## 2018-03-16 ENCOUNTER — Encounter: Payer: Self-pay | Admitting: Surgery

## 2018-03-16 ENCOUNTER — Other Ambulatory Visit: Payer: Self-pay

## 2018-03-16 ENCOUNTER — Ambulatory Visit: Payer: BLUE CROSS/BLUE SHIELD | Admitting: Surgery

## 2018-03-16 VITALS — BP 130/78 | HR 67 | Temp 97.9°F | Resp 13 | Ht 63.0 in | Wt 168.0 lb

## 2018-03-16 DIAGNOSIS — K620 Anal polyp: Secondary | ICD-10-CM | POA: Diagnosis not present

## 2018-03-16 NOTE — Progress Notes (Signed)
Patient ID: Terri Tanner, female   DOB: 1960-07-26, 57 y.o.   MRN: [id number]  HPI Terri Tanner is a 57 y.o. female saltation at the request of Dr. Vicente Males for a rectal /anal polyp.  He had a colonoscopy and this lesion was found.  She denies any family history of colorectal cancer.  She reports that sometimes she feels the polyp coming out.  She experienced some intermittent pain that is sharp in nature and moderate in severity.  She has had this for several years now.  No weight loss. I Have personally reviewed the images from the colonoscopy showing a rectal polyp Is able to perform more than 4 METS of activity without any shortness of breath or chest pain.  Denies any melena or hematochezia.  CBC and CMP are normal.  Hb 1AC is 6.8. Previous history of abdominal hysterectomy  HPI  Past Medical History:  Diagnosis Date  . Allergy   . Arthritis    just started in the right hand  . Diabetes mellitus type 2 in obese ([place]) 02/06/2018  . GERD (gastroesophageal reflux disease)     Past Surgical History:  Procedure Laterality Date  . ABDOMINAL HYSTERECTOMY    . CERVICAL POLYPECTOMY    . CESAREAN SECTION    . COLONOSCOPY WITH PROPOFOL N/A 03/03/2018   Procedure: COLONOSCOPY WITH PROPOFOL;  Surgeon: Jonathon Bellows, MD;  Location: [facility] ENDOSCOPY;  Service: Gastroenterology;  Laterality: N/A;  . WISDOM TOOTH EXTRACTION    . WISDOM TOOTH EXTRACTION      Family History  Problem Relation Age of Onset  . Lung cancer Mother        smoked for many years  . COPD Mother   . Emphysema Mother   . Esophageal cancer Father        smoker  . Suicidality Father   . Stroke Brother   . Alcohol abuse Brother   . Other Brother        huge smoker  . Diabetes Sister   . Breast cancer Neg Hx     Social History Social History   Tobacco Use  . Smoking status: Never Smoker  . Smokeless tobacco: Never Used  Substance Use Topics  . Alcohol use: Not Currently    Comment: rarely  . Drug use: Never     No Known Allergies  Current Outpatient Medications  Medication Sig Dispense Refill  . cholecalciferol (VITAMIN D) 1000 units tablet Take 1,000 Units by mouth daily.    Marland Kitchen loratadine (CLARITIN) 10 MG tablet Take 10 mg by mouth daily.    . NON FORMULARY APPLY TO AFFECTED AREA ONCE DAILY AS DIRECTED. THIS IS A COMPOUNDED CREAM.  2  . NONFORMULARY OR COMPOUNDED ITEM See phamrayc 120 each 2  . rosuvastatin (CRESTOR) 20 MG tablet Take 1 tablet (20 mg total) by mouth daily. Start with half first week after that one pill 30 tablet 2   No current facility-administered medications for this visit.      Review of Systems Full ROS  was asked and was negative except for the information on the HPI  Physical Exam Blood pressure 130/78, pulse 67, temperature 97.9 F (36.6 C), temperature source Temporal, resp. rate 13, height 5\' 3"  (1.6 m), weight 168 lb (76.2 kg), SpO2 98 %. CONSTITUTIONAL: NAD EYES: Pupils are equal, round, and reactive to light, Sclera are non-icteric. EARS, NOSE, MOUTH AND THROAT: The oropharynx is clear. The oral mucosa is pink and moist. Hearing is intact to voice. LYMPH  NODES:  Lymph nodes in the neck are normal. RESPIRATORY:  Lungs are clear. There is normal respiratory effort, with equal breath sounds bilaterally, and without pathologic use of accessory muscles. CARDIOVASCULAR: Heart is regular without murmurs, gallops, or rubs. GI: The abdomen is soft, nontender, and nondistended. There are no palpable masses. There is no hepatosplenomegaly. There are normal bowel sounds in all quadrants. Rectal: normal tone no anal lesion, rectal exam confirms polyp about 3 cms from anal verge and on anterior wall.  MUSCULOSKELETAL: Normal muscle strength and tone. No cyanosis or edema.   SKIN: Turgor is good and there are no pathologic skin lesions or ulcers. NEUROLOGIC: Motor and sensation is grossly normal. Cranial nerves are grossly intact. PSYCH:  Oriented to person, place and  time. Affect is normal.  Data Reviewed  I have personally reviewed the patient's imaging, laboratory findings and medical records.    Assessment/Plan Benign rectal nodule/polyp that is symptomatic.  Definitely recommend surgical excision in the OR.  Discussed with the patient in detail about the procedure.  Risk benefit and possible complications including but not limited to: Bleeding, infection, recurrence, the chance of this being cancer and requiring further surgeries.  Risk of incontinence and wound infection.  She understands and wishes to proceed.  We will plan to perform this in the OR patient in prone position since is an anterior rectal mass.   Caroleen Hamman, MD FACS General Surgeon 03/16/2018, 2:23 PM

## 2018-03-16 NOTE — H&P (View-Only) (Signed)
Patient ID: Terri Tanner, female   DOB: [birth year], 57 y.o.   MRN: [id number]  HPI Terri Tanner is a 57 y.o. female saltation at the request of Dr. Vicente Males for a rectal /anal polyp.  He had a colonoscopy and this lesion was found.  She denies any family history of colorectal cancer.  She reports that sometimes she feels the polyp coming out.  She experienced some intermittent pain that is sharp in nature and moderate in severity.  She has had this for several years now.  No weight loss. I Have personally reviewed the images from the colonoscopy showing a rectal polyp Is able to perform more than 4 METS of activity without any shortness of breath or chest pain.  Denies any melena or hematochezia.  CBC and CMP are normal.  Hb 1AC is 6.8. Previous history of abdominal hysterectomy  HPI  Past Medical History:  Diagnosis Date  . Allergy   . Arthritis    just started in the right hand  . Diabetes mellitus type 2 in obese ([place]) 02/06/2018  . GERD (gastroesophageal reflux disease)     Past Surgical History:  Procedure Laterality Date  . ABDOMINAL HYSTERECTOMY    . CERVICAL POLYPECTOMY    . CESAREAN SECTION    . COLONOSCOPY WITH PROPOFOL N/A 03/03/2018   Procedure: COLONOSCOPY WITH PROPOFOL;  Surgeon: Jonathon Bellows, MD;  Location: [facility] ENDOSCOPY;  Service: Gastroenterology;  Laterality: N/A;  . WISDOM TOOTH EXTRACTION    . WISDOM TOOTH EXTRACTION      Family History  Problem Relation Age of Onset  . Lung cancer Mother        smoked for many years  . COPD Mother   . Emphysema Mother   . Esophageal cancer Father        smoker  . Suicidality Father   . Stroke Brother   . Alcohol abuse Brother   . Other Brother        huge smoker  . Diabetes Sister   . Breast cancer Neg Hx     Social History Social History   Tobacco Use  . Smoking status: Never Smoker  . Smokeless tobacco: Never Used  Substance Use Topics  . Alcohol use: Not Currently    Comment: rarely  . Drug use: Never     No Known Allergies  Current Outpatient Medications  Medication Sig Dispense Refill  . cholecalciferol (VITAMIN D) 1000 units tablet Take 1,000 Units by mouth daily.    Marland Kitchen loratadine (CLARITIN) 10 MG tablet Take 10 mg by mouth daily.    . NON FORMULARY APPLY TO AFFECTED AREA ONCE DAILY AS DIRECTED. THIS IS A COMPOUNDED CREAM.  2  . NONFORMULARY OR COMPOUNDED ITEM See phamrayc 120 each 2  . rosuvastatin (CRESTOR) 20 MG tablet Take 1 tablet (20 mg total) by mouth daily. Start with half first week after that one pill 30 tablet 2   No current facility-administered medications for this visit.      Review of Systems Full ROS  was asked and was negative except for the information on the HPI  Physical Exam Blood pressure 130/78, pulse 67, temperature 97.9 F (36.6 C), temperature source Temporal, resp. rate 13, height 5\' 3"  (1.6 m), weight 168 lb (76.2 kg), SpO2 98 %. CONSTITUTIONAL: NAD EYES: Pupils are equal, round, and reactive to light, Sclera are non-icteric. EARS, NOSE, MOUTH AND THROAT: The oropharynx is clear. The oral mucosa is pink and moist. Hearing is intact to voice. LYMPH  NODES:  Lymph nodes in the neck are normal. RESPIRATORY:  Lungs are clear. There is normal respiratory effort, with equal breath sounds bilaterally, and without pathologic use of accessory muscles. CARDIOVASCULAR: Heart is regular without murmurs, gallops, or rubs. GI: The abdomen is soft, nontender, and nondistended. There are no palpable masses. There is no hepatosplenomegaly. There are normal bowel sounds in all quadrants. Rectal: normal tone no anal lesion, rectal exam confirms polyp about 3 cms from anal verge and on anterior wall.  MUSCULOSKELETAL: Normal muscle strength and tone. No cyanosis or edema.   SKIN: Turgor is good and there are no pathologic skin lesions or ulcers. NEUROLOGIC: Motor and sensation is grossly normal. Cranial nerves are grossly intact. PSYCH:  Oriented to person, place and  time. Affect is normal.  Data Reviewed  I have personally reviewed the patient's imaging, laboratory findings and medical records.    Assessment/Plan Benign rectal nodule/polyp that is symptomatic.  Definitely recommend surgical excision in the OR.  Discussed with the patient in detail about the procedure.  Risk benefit and possible complications including but not limited to: Bleeding, infection, recurrence, the chance of this being cancer and requiring further surgeries.  Risk of incontinence and wound infection.  She understands and wishes to proceed.  We will plan to perform this in the OR patient in prone position since is an anterior rectal mass.   Caroleen Hamman, MD FACS General Surgeon 03/16/2018, 2:23 PM

## 2018-03-16 NOTE — Patient Instructions (Signed)
Please schedule patient for anal polyp surgery with Dr.Pabon.   Colon Polyps Polyps are tissue growths inside the body. Polyps can grow in many places, including the large intestine (colon). A polyp may be a round bump or a mushroom-shaped growth. You could have one polyp or several. Most colon polyps are noncancerous (benign). However, some colon polyps can become cancerous over time. What are the causes? The exact cause of colon polyps is not known. What increases the risk? This condition is more likely to develop in people who:  Have a family history of colon cancer or colon polyps.  Are older than 39 or older than 45 if they are African American.  Have inflammatory bowel disease, such as ulcerative colitis or Crohn disease.  Are overweight.  Smoke cigarettes.  Do not get enough exercise.  Drink too much alcohol.  Eat a diet that is: ? High in fat and red meat. ? Low in fiber.  Had childhood cancer that was treated with abdominal radiation.  What are the signs or symptoms? Most polyps do not cause symptoms. If you have symptoms, they may include:  Blood coming from your rectum when having a bowel movement.  Blood in your stool.The stool may look dark red or black.  A change in bowel habits, such as constipation or diarrhea.  How is this diagnosed? This condition is diagnosed with a colonoscopy. This is a procedure that uses a lighted, flexible scope to look at the inside of your colon. How is this treated? Treatment for this condition involves removing any polyps that are found. Those polyps will then be tested for cancer. If cancer is found, your health care provider will talk to you about options for colon cancer treatment. Follow these instructions at home: Diet  Eat plenty of fiber, such as fruits, vegetables, and whole grains.  Eat foods that are high in calcium and vitamin D, such as milk, cheese, yogurt, eggs, liver, fish, and broccoli.  Limit foods high  in fat, red meats, and processed meats, such as hot dogs, sausage, bacon, and lunch meats.  Maintain a healthy weight, or lose weight if recommended by your health care provider. General instructions  Do not smoke cigarettes.  Do not drink alcohol excessively.  Keep all follow-up visits as told by your health care provider. This is important. This includes keeping regularly scheduled colonoscopies. Talk to your health care provider about when you need a colonoscopy.  Exercise every day or as told by your health care provider. Contact a health care provider if:  You have new or worsening bleeding during a bowel movement.  You have new or increased blood in your stool.  You have a change in bowel habits.  You unexpectedly lose weight. This information is not intended to replace advice given to you by your health care provider. Make sure you discuss any questions you have with your health care provider. Document Released: 03/04/2004 Document Revised: 11/14/2015 Document Reviewed: 04/29/2015 Elsevier Interactive Patient Education  [employer].

## 2018-03-17 ENCOUNTER — Encounter: Payer: Self-pay | Admitting: *Deleted

## 2018-03-17 NOTE — Progress Notes (Signed)
Patient's surgery has been scheduled for 04-14-18 at [facility] with Dr. Dahlia Byes.   The patient was instructed to call the office should she have further questions.

## 2018-03-31 ENCOUNTER — Encounter
Admission: RE | Admit: 2018-03-31 | Discharge: 2018-03-31 | Disposition: A | Discharge status: Home / Self Care | Payer: BLUE CROSS/BLUE SHIELD | Source: Ambulatory Visit | Attending: Surgery | Admitting: Surgery

## 2018-03-31 ENCOUNTER — Other Ambulatory Visit: Payer: Self-pay

## 2018-03-31 NOTE — Patient Instructions (Signed)
Your procedure is scheduled on: 04-14-18 THURSDAY Report to Same Day Surgery 2nd floor medical mall [facility] Entrance-take elevator on left to 2nd floor.  Check in with surgery information desk.) To find out your arrival time please call [phone] between 1PM - 3PM on 04-13-18 [facility]  Remember: Instructions that are not followed completely may result in serious medical risk, up to and including death, or upon the discretion of your surgeon and anesthesiologist your surgery may need to be rescheduled.    _x___ 1. Do not eat food after midnight the night before your procedure. You may drink clear liquids up to 2 hours before you are scheduled to arrive at the hospital for your procedure.  Do not drink clear liquids within 2 hours of your scheduled arrival to the hospital.  Clear liquids include  --Water or Apple juice without pulp  --Clear carbohydrate beverage such as ClearFast or Gatorade  --Black Coffee or Clear Tea (No milk, no creamers, do not add anything to the coffee or Tea   ____Ensure clear carbohydrate drink on the way to the hospital for bariatric patients  ____Ensure clear carbohydrate drink 3 hours before surgery for Dr Dwyane Luo patients if physician instructed.   No gum chewing or hard candies.     __x__ 2. No Alcohol for 24 hours before or after surgery.   __x__3. No Smoking or e-cigarettes for 24 prior to surgery.  Do not use any chewable tobacco products for at least 6 hour prior to surgery   ____  4. Bring all medications with you on the day of surgery if instructed.    __x__ 5. Notify your doctor if there is any change in your medical condition     (cold, fever, infections).    x___6. On the morning of surgery brush your teeth with toothpaste and water.  You may rinse your mouth with mouth wash if you wish.  Do not swallow any toothpaste or mouthwash.   Do not wear jewelry, make-up, hairpins, clips or nail polish.  Do not wear lotions, powders, or  perfumes. You may wear deodorant.  Do not shave 48 hours prior to surgery. Men may shave face and neck.  Do not bring valuables to the hospital.    [facility] is not responsible for any belongings or valuables.               Contacts, dentures or bridgework may not be worn into surgery.  Leave your suitcase in the car. After surgery it may be brought to your room.  For patients admitted to the hospital, discharge time is determined by your treatment team.  _  Patients discharged the day of surgery will not be allowed to drive home.  You will need someone to drive you home and stay with you the night of your procedure.    Please read over the following fact sheets that you were given:   [facility] Preparing for Surgery  _x___ TAKE THE FOLLOWING MEDICATION THE MORNING OF SURGERY WITH A SMALL SIP OF WATER. These include:  1. CRESTOR  2.  3.  4.  5.  6.  _X___Fleets enema as directed-DO 1 FLEET ENEMA THE NIGHT BEFORE YOUR SURGERY AND ANOTHER FLEET ENEMA THE MORNING OF YOUR SURGERY 1 HOUR PRIOR TO ARRIVAL TIME TO HOSPITAL   ____ Use CHG Soap or sage wipes as directed on instruction sheet   ____ Use inhalers on the day of surgery and bring to hospital day of surgery  ____  Stop Metformin and Janumet 2 days prior to surgery.    ____ Take 1/2 of usual insulin dose the night before surgery and none on the morning surgery.   _x___ Follow recommendations from Cardiologist, Pulmonologist or PCP regarding stopping Aspirin, Coumadin, Plavix ,Eliquis, Effient, or Pradaxa, and Pletal-STOP ASA 7 DAYS PRIOR TO SURGERY  X____Stop Anti-inflammatories such as Advil, Aleve, Ibuprofen, Motrin, Naproxen, Naprosyn, Goodies powders or aspirin products NOW-OK to take Tylenol   _x___ Stop supplements until after surgery-STOP COLLAGEN POWDER 7 DAYS PRIOR TO SURGERY-MAY RESUME AFTER SURGERY   ____ Bring C-Pap to the hospital.

## 2018-04-07 ENCOUNTER — Inpatient Hospital Stay: Admission: RE | Admit: 2018-04-07 | Payer: BLUE CROSS/BLUE SHIELD | Source: Ambulatory Visit

## 2018-04-14 ENCOUNTER — Ambulatory Visit
Admission: RE | Admit: 2018-04-14 | Discharge: 2018-04-14 | Disposition: A | Discharge status: Home / Self Care | Payer: BLUE CROSS/BLUE SHIELD | Source: Ambulatory Visit | Attending: Surgery | Admitting: Surgery

## 2018-04-14 ENCOUNTER — Encounter: Payer: Self-pay | Admitting: *Deleted

## 2018-04-14 ENCOUNTER — Ambulatory Visit: Payer: BLUE CROSS/BLUE SHIELD | Admitting: Registered Nurse

## 2018-04-14 ENCOUNTER — Other Ambulatory Visit: Payer: Self-pay

## 2018-04-14 ENCOUNTER — Encounter
Admission: RE | Disposition: A | Discharge status: Home / Self Care | Payer: Self-pay | Source: Ambulatory Visit | Attending: Surgery

## 2018-04-14 DIAGNOSIS — K649 Unspecified hemorrhoids: Secondary | ICD-10-CM | POA: Insufficient documentation

## 2018-04-14 DIAGNOSIS — M19041 Primary osteoarthritis, right hand: Secondary | ICD-10-CM | POA: Insufficient documentation

## 2018-04-14 DIAGNOSIS — E119 Type 2 diabetes mellitus without complications: Secondary | ICD-10-CM | POA: Insufficient documentation

## 2018-04-14 DIAGNOSIS — K219 Gastro-esophageal reflux disease without esophagitis: Secondary | ICD-10-CM | POA: Insufficient documentation

## 2018-04-14 DIAGNOSIS — Z7982 Long term (current) use of aspirin: Secondary | ICD-10-CM | POA: Insufficient documentation

## 2018-04-14 DIAGNOSIS — K620 Anal polyp: Secondary | ICD-10-CM

## 2018-04-14 DIAGNOSIS — Z79899 Other long term (current) drug therapy: Secondary | ICD-10-CM | POA: Diagnosis not present

## 2018-04-14 DIAGNOSIS — K621 Rectal polyp: Secondary | ICD-10-CM

## 2018-04-14 HISTORY — PX: TRANSANAL EXCISION OF RECTAL MASS: SHX6134

## 2018-04-14 LAB — GLUCOSE, CAPILLARY: Glucose-Capillary: 116 mg/dL — ABNORMAL HIGH (ref 70–99)

## 2018-04-14 SURGERY — EXAM UNDER ANESTHESIA
Anesthesia: General | Site: Rectum

## 2018-04-14 MED ORDER — BUPIVACAINE LIPOSOME 1.3 % IJ SUSP
INTRAMUSCULAR | Status: DC | PRN
  Administered 2018-04-14: 15 mL

## 2018-04-14 MED ORDER — PROMETHAZINE HCL 25 MG/ML IJ SOLN: 6.2500 mg | INTRAMUSCULAR | Status: DC | PRN

## 2018-04-14 MED ORDER — ROCURONIUM BROMIDE 50 MG/5ML IV SOLN
INTRAVENOUS | Status: AC
  Filled 2018-04-14: qty 1

## 2018-04-14 MED ORDER — MIDAZOLAM HCL 2 MG/2ML IJ SOLN
INTRAMUSCULAR | Status: DC | PRN
  Administered 2018-04-14: 2 mg via INTRAVENOUS

## 2018-04-14 MED ORDER — ONDANSETRON HCL 4 MG/2ML IJ SOLN
INTRAMUSCULAR | Status: AC
  Filled 2018-04-14: qty 2

## 2018-04-14 MED ORDER — SUCCINYLCHOLINE CHLORIDE 20 MG/ML IJ SOLN
INTRAMUSCULAR | Status: AC
  Filled 2018-04-14: qty 1

## 2018-04-14 MED ORDER — MIDAZOLAM HCL 2 MG/2ML IJ SOLN
INTRAMUSCULAR | Status: AC
  Filled 2018-04-14: qty 2

## 2018-04-14 MED ORDER — BUPIVACAINE LIPOSOME 1.3 % IJ SUSP
INTRAMUSCULAR | Status: AC
  Filled 2018-04-14: qty 20

## 2018-04-14 MED ORDER — DEXAMETHASONE SODIUM PHOSPHATE 10 MG/ML IJ SOLN
INTRAMUSCULAR | Status: DC | PRN
  Administered 2018-04-14: 8 mg via INTRAVENOUS

## 2018-04-14 MED ORDER — FAMOTIDINE 20 MG PO TABS
ORAL_TABLET | ORAL | Status: AC
  Filled 2018-04-14: qty 1

## 2018-04-14 MED ORDER — OXYCODONE HCL 5 MG/5ML PO SOLN: 5.0000 mg | Freq: Once | ORAL | Status: DC | PRN

## 2018-04-14 MED ORDER — CHLORHEXIDINE GLUCONATE CLOTH 2 % EX PADS: 6.0000 | MEDICATED_PAD | Freq: Once | CUTANEOUS | Status: DC

## 2018-04-14 MED ORDER — PROPOFOL 10 MG/ML IV BOLUS
INTRAVENOUS | Status: AC
  Filled 2018-04-14: qty 20

## 2018-04-14 MED ORDER — FENTANYL CITRATE (PF) 100 MCG/2ML IJ SOLN
INTRAMUSCULAR | Status: DC | PRN
  Administered 2018-04-14: 50 ug via INTRAVENOUS
  Administered 2018-04-14 (×2): 25 ug via INTRAVENOUS

## 2018-04-14 MED ORDER — OXYCODONE HCL 5 MG PO TABS: 5.0000 mg | ORAL_TABLET | Freq: Once | ORAL | Status: DC | PRN

## 2018-04-14 MED ORDER — ROCURONIUM BROMIDE 100 MG/10ML IV SOLN
INTRAVENOUS | Status: DC | PRN
  Administered 2018-04-14: 40 mg via INTRAVENOUS

## 2018-04-14 MED ORDER — SUGAMMADEX SODIUM 200 MG/2ML IV SOLN
INTRAVENOUS | Status: AC
  Filled 2018-04-14: qty 2

## 2018-04-14 MED ORDER — FENTANYL CITRATE (PF) 100 MCG/2ML IJ SOLN: 25.0000 ug | INTRAMUSCULAR | Status: DC | PRN

## 2018-04-14 MED ORDER — FENTANYL CITRATE (PF) 100 MCG/2ML IJ SOLN
INTRAMUSCULAR | Status: AC
  Filled 2018-04-14: qty 2

## 2018-04-14 MED ORDER — DEXAMETHASONE SODIUM PHOSPHATE 10 MG/ML IJ SOLN
INTRAMUSCULAR | Status: AC
  Filled 2018-04-14: qty 1

## 2018-04-14 MED ORDER — LIDOCAINE HCL (CARDIAC) PF 100 MG/5ML IV SOSY
PREFILLED_SYRINGE | INTRAVENOUS | Status: DC | PRN
  Administered 2018-04-14: 70 mg via INTRAVENOUS

## 2018-04-14 MED ORDER — ONDANSETRON HCL 4 MG/2ML IJ SOLN
INTRAMUSCULAR | Status: DC | PRN
  Administered 2018-04-14: 4 mg via INTRAVENOUS

## 2018-04-14 MED ORDER — FAMOTIDINE 20 MG PO TABS
20.0000 mg | ORAL_TABLET | Freq: Once | ORAL | Status: AC
  Administered 2018-04-14: 20 mg via ORAL

## 2018-04-14 MED ORDER — PROPOFOL 10 MG/ML IV BOLUS
INTRAVENOUS | Status: DC | PRN
  Administered 2018-04-14: 150 mg via INTRAVENOUS

## 2018-04-14 MED ORDER — LIDOCAINE HCL (PF) 2 % IJ SOLN
INTRAMUSCULAR | Status: AC
  Filled 2018-04-14: qty 10

## 2018-04-14 MED ORDER — HYDROCODONE-ACETAMINOPHEN 5-325 MG PO TABS: 1.0000 | ORAL_TABLET | Freq: Four times a day (QID) | ORAL | 0 refills | Status: DC | PRN

## 2018-04-14 MED ORDER — BUPIVACAINE-EPINEPHRINE (PF) 0.25% -1:200000 IJ SOLN
INTRAMUSCULAR | Status: DC | PRN
  Administered 2018-04-14: 15 mL

## 2018-04-14 MED ORDER — SUGAMMADEX SODIUM 200 MG/2ML IV SOLN
INTRAVENOUS | Status: DC | PRN
  Administered 2018-04-14: 146.8 mg via INTRAVENOUS

## 2018-04-14 MED ORDER — MEPERIDINE HCL 50 MG/ML IJ SOLN: 6.2500 mg | INTRAMUSCULAR | Status: DC | PRN

## 2018-04-14 MED ORDER — BUPIVACAINE-EPINEPHRINE (PF) 0.25% -1:200000 IJ SOLN
INTRAMUSCULAR | Status: AC
  Filled 2018-04-14: qty 30

## 2018-04-14 MED ORDER — LACTATED RINGERS IV SOLN
INTRAVENOUS | Status: DC
  Administered 2018-04-14: 11:00:00 via INTRAVENOUS

## 2018-04-14 SURGICAL SUPPLY — 20 items
ADHESIVE MASTISOL STRL (MISCELLANEOUS) ×6 IMPLANT
BLADE SURG 15 STRL LF DISP TIS (BLADE) ×1 IMPLANT
BLADE SURG 15 STRL SS (BLADE) ×2
CANISTER SUCT 1200ML W/VALVE (MISCELLANEOUS) ×3 IMPLANT
COVER WAND RF STERILE (DRAPES) IMPLANT
ELECT CAUTERY BLADE 6.4 (BLADE) ×3 IMPLANT
ELECT REM PT RETURN 9FT ADLT (ELECTROSURGICAL) ×3
ELECTRODE REM PT RTRN 9FT ADLT (ELECTROSURGICAL) ×1 IMPLANT
GLOVE BIO SURGEON STRL SZ7 (GLOVE) ×6 IMPLANT
GOWN STRL REUS W/ TWL LRG LVL3 (GOWN DISPOSABLE) ×2 IMPLANT
GOWN STRL REUS W/TWL LRG LVL3 (GOWN DISPOSABLE) ×4
NEEDLE HYPO 21X1.5 SAFETY (NEEDLE) ×3 IMPLANT
NS IRRIG 500ML POUR BTL (IV SOLUTION) ×3 IMPLANT
PACK BASIN MINOR ARMC (MISCELLANEOUS) ×3 IMPLANT
SHEARS HARMONIC 9CM CVD (BLADE) ×3 IMPLANT
SOL PREP PVP 2OZ (MISCELLANEOUS) ×3
SOLUTION PREP PVP 2OZ (MISCELLANEOUS) ×1 IMPLANT
SPONGE LAP 18X18 RF (DISPOSABLE) ×3 IMPLANT
SURGILUBE 2OZ TUBE FLIPTOP (MISCELLANEOUS) ×3 IMPLANT
SYR 10ML SLIP (SYRINGE) ×3 IMPLANT

## 2018-04-14 NOTE — Anesthesia Preprocedure Evaluation (Signed)
Anesthesia Evaluation  Patient identified by MRN, date of birth, ID band Patient awake    Reviewed: Allergy & Precautions, NPO status , Patient's Chart, lab work & pertinent test results  History of Anesthesia Complications Negative for: history of anesthetic complications  Airway Mallampati: II  TM Distance: >3 FB Neck ROM: Full    Dental no notable dental hx.    Pulmonary neg pulmonary ROS, neg sleep apnea, neg COPD,    breath sounds clear to auscultation- rhonchi (-) wheezing      Cardiovascular Exercise Tolerance: Good (-) hypertension(-) CAD, (-) Past MI, (-) Cardiac Stents and (-) CABG  Rhythm:Regular Rate:Normal - Systolic murmurs and - Diastolic murmurs    Neuro/Psych negative neurological ROS  negative psych ROS   GI/Hepatic Neg liver ROS, GERD  ,  Endo/Other  diabetes  Renal/GU negative Renal ROS     Musculoskeletal  (+) Arthritis ,   Abdominal (+) - obese,   Peds  Hematology negative hematology ROS (+)   Anesthesia Other Findings Past Medical History: No date: Allergy No date: Arthritis     Comment:  just started in the right hand 02/06/2018: Diabetes mellitus type 2 in obese [facility])   Reproductive/Obstetrics                             Anesthesia Physical Anesthesia Plan  ASA: II  Anesthesia Plan: General   Post-op Pain Management:    Induction: Intravenous  PONV Risk Score and Plan: 2 and Dexamethasone, Ondansetron and Midazolam  Airway Management Planned: Oral ETT  Additional Equipment:   Intra-op Plan:   Post-operative Plan: Extubation in OR  Informed Consent: I have reviewed the patients History and Physical, chart, labs and discussed the procedure including the risks, benefits and alternatives for the proposed anesthesia with the patient or authorized representative who has indicated his/her understanding and acceptance.   Dental advisory given  Plan  Discussed with: CRNA and Anesthesiologist  Anesthesia Plan Comments:         Anesthesia Quick Evaluation

## 2018-04-14 NOTE — Interval H&P Note (Signed)
History and Physical Interval Note:  04/14/2018 10:50 AM  Terri Tanner  has presented today for surgery, with the diagnosis of ANAL POLYP  The various methods of treatment have been discussed with the patient and family. After consideration of risks, benefits and other options for treatment, the patient has consented to  Procedure(s): EXAM UNDER ANESTHESIA (N/A) TRANSANAL EXCISION OF RECTAL POLYP (N/A) as a surgical intervention .  The patient's history has been reviewed, patient examined, no change in status, stable for surgery.  I have reviewed the patient's chart and labs.  Questions were answered to the patient's satisfaction.     [place]

## 2018-04-14 NOTE — Op Note (Signed)
  04/14/2018  12:01 PM  PATIENT:  Terri Tanner  57 y.o. female  PRE-OPERATIVE DIAGNOSIS:  Anterior pedunculated  rectal polyp 4 cm from anal verge  POST-OPERATIVE DIAGNOSIS:  Same  PROCEDURE:   Transanal excision of 2 cm rectal polyp  SURGEON:  Surgeon(s) and Role:    * Katonya Blecher F, MD - Primary  ASSISTANTS: none  ANESTHESIA: GETA    DICTATION:  Patient was explained about the procedure in detail, risks benefits possible complications and a consent was obtained. The patient taken to the operating room and placed in the prone position. Speculum was placed visualizing the polyp. Using Allis we elevated the polyp and resected with the Harmonic scalpel. There was good Hemostasis. Good healthy Margins were incorporated into the resection margin No other rectal lesion appreciated. Polyp looked benign without any invasive component.  Marcaine quarter percent with epinephrine and liposomal marcaine was injected around the wound site. Needle and laparotomy counts were correct and there were no immediate complications  Jules Husbands, MD

## 2018-04-14 NOTE — Discharge Instructions (Signed)

## 2018-04-14 NOTE — Transfer of Care (Signed)
Immediate Anesthesia Transfer of Care Note  Patient: Terri Tanner  Procedure(s) Performed: [name] UNDER ANESTHESIA (N/A Rectum) TRANSANAL EXCISION OF RECTAL POLYP (N/A Rectum)  Patient Location: PACU  Anesthesia Type:General  Level of Consciousness: drowsy  Airway & Oxygen Therapy: Patient Spontanous Breathing and Patient connected to face mask oxygen  Post-op Assessment: Report given to RN and Post -op Vital signs reviewed and stable  Post vital signs: Reviewed and stable  Last Vitals:  Vitals Value Taken Time  BP 93/52 04/14/2018 12:05 PM  Temp    Pulse 86 04/14/2018 12:05 PM  Resp 1 04/14/2018 12:05 PM  SpO2 100 % 04/14/2018 12:05 PM  Vitals shown include unvalidated device data.  Last Pain:  Vitals:   04/14/18 1031  TempSrc: Temporal  PainSc: 0-No pain         Complications: No apparent anesthesia complications

## 2018-04-14 NOTE — Anesthesia Procedure Notes (Signed)
Procedure Name: Intubation Performed by: Lia Foyer, RN Pre-anesthesia Checklist: Patient identified, Patient being monitored, Timeout performed, Emergency Drugs available and Suction available Patient Re-evaluated:Patient Re-evaluated prior to induction Oxygen Delivery Method: Circle system utilized Preoxygenation: Pre-oxygenation with 100% oxygen Induction Type: IV induction Ventilation: Mask ventilation without difficulty Laryngoscope Size: Mac and 3 Grade View: Grade I Tube type: Oral Tube size: 7.0 mm Number of attempts: 1 Airway Equipment and Method: Stylet Placement Confirmation: ETT inserted through vocal cords under direct vision,  positive ETCO2 and breath sounds checked- equal and bilateral Secured at: 21 cm Tube secured with: Tape Dental Injury: Teeth and Oropharynx as per pre-operative assessment

## 2018-04-14 NOTE — Anesthesia Post-op Follow-up Note (Signed)
Anesthesia QCDR form completed.        

## 2018-04-14 NOTE — Anesthesia Postprocedure Evaluation (Signed)
Anesthesia Post Note  Patient: GEORGEAN SPAINHOWER  Procedure(s) Performed: EXAM UNDER ANESTHESIA (N/A Rectum) TRANSANAL EXCISION OF RECTAL POLYP (N/A Rectum)  Patient location during evaluation: PACU Anesthesia Type: General Level of consciousness: awake and alert and oriented Pain management: pain level controlled Vital Signs Assessment: post-procedure vital signs reviewed and stable Respiratory status: spontaneous breathing, nonlabored ventilation and respiratory function stable Cardiovascular status: blood pressure returned to baseline and stable Postop Assessment: no signs of nausea or vomiting Anesthetic complications: no     Last Vitals:  Vitals:   04/14/18 1236 04/14/18 1250  BP: 110/68 121/74  Pulse: 80 77  Resp: 18 16  Temp: (!) 36.4 C 36.6 C  SpO2: 98% 99%    Last Pain:  Vitals:   04/14/18 1250  TempSrc: Temporal  PainSc: 0-No pain                 Andreah Goheen

## 2018-04-16 LAB — SURGICAL PATHOLOGY

## 2018-04-19 LAB — HM DIABETES EYE EXAM

## 2018-04-27 ENCOUNTER — Ambulatory Visit (INDEPENDENT_AMBULATORY_CARE_PROVIDER_SITE_OTHER): Payer: BLUE CROSS/BLUE SHIELD | Admitting: Surgery

## 2018-04-27 ENCOUNTER — Encounter: Payer: Self-pay | Admitting: Surgery

## 2018-04-27 ENCOUNTER — Other Ambulatory Visit: Payer: Self-pay

## 2018-04-27 VITALS — BP 107/75 | HR 86 | Temp 97.9°F | Ht 63.0 in | Wt 165.2 lb

## 2018-04-27 DIAGNOSIS — Z09 Encounter for follow-up examination after completed treatment for conditions other than malignant neoplasm: Secondary | ICD-10-CM

## 2018-04-27 NOTE — Patient Instructions (Signed)
Please call our office if you have questions or concerns.   

## 2018-04-29 ENCOUNTER — Encounter: Payer: Self-pay | Admitting: Surgery

## 2018-04-29 NOTE — Progress Notes (Signed)
S/p transanal rectal polyp No issues Path d/w pt  PE NAD abd soft, nt  A/ p Doing well Keep[ routine colonoscopies RTC prn

## 2018-05-12 ENCOUNTER — Other Ambulatory Visit (HOSPITAL_COMMUNITY)
Admission: RE | Admit: 2018-05-12 | Discharge: 2018-05-12 | Disposition: A | Discharge status: Home / Self Care | Payer: BLUE CROSS/BLUE SHIELD | Source: Ambulatory Visit | Attending: Family Medicine | Admitting: Family Medicine

## 2018-05-12 ENCOUNTER — Encounter: Payer: Self-pay | Admitting: Family Medicine

## 2018-05-12 ENCOUNTER — Ambulatory Visit (INDEPENDENT_AMBULATORY_CARE_PROVIDER_SITE_OTHER): Payer: BLUE CROSS/BLUE SHIELD | Admitting: Family Medicine

## 2018-05-12 VITALS — BP 106/68 | HR 100 | Temp 98.0°F | Resp 16 | Ht 62.0 in | Wt 160.8 lb

## 2018-05-12 DIAGNOSIS — Z01419 Encounter for gynecological examination (general) (routine) without abnormal findings: Secondary | ICD-10-CM

## 2018-05-12 DIAGNOSIS — Z79899 Other long term (current) drug therapy: Secondary | ICD-10-CM | POA: Insufficient documentation

## 2018-05-12 DIAGNOSIS — E785 Hyperlipidemia, unspecified: Secondary | ICD-10-CM

## 2018-05-12 DIAGNOSIS — E1169 Type 2 diabetes mellitus with other specified complication: Secondary | ICD-10-CM

## 2018-05-12 DIAGNOSIS — Z124 Encounter for screening for malignant neoplasm of cervix: Secondary | ICD-10-CM | POA: Diagnosis not present

## 2018-05-12 MED ORDER — ROSUVASTATIN CALCIUM 20 MG PO TABS: 20.0000 mg | ORAL_TABLET | ORAL | 1 refills | Status: DC

## 2018-05-12 NOTE — Patient Instructions (Signed)
Preventive Care 40-64 Years, Female Preventive care refers to lifestyle choices and visits with your health care provider that can promote health and wellness. What does preventive care include?  A yearly physical exam. This is also called an annual well check.  Dental exams once or twice a year.  Routine eye exams. Ask your health care provider how often you should have your eyes checked.  Personal lifestyle choices, including: ? Daily care of your teeth and gums. ? Regular physical activity. ? Eating a healthy diet. ? Avoiding tobacco and drug use. ? Limiting alcohol use. ? Practicing safe sex. ? Taking low-dose aspirin daily starting at age 58. ? Taking vitamin and mineral supplements as recommended by your health care provider. What happens during an annual well check? The services and screenings done by your health care provider during your annual well check will depend on your age, overall health, lifestyle risk factors, and family history of disease. Counseling Your health care provider may ask you questions about your:  Alcohol use.  Tobacco use.  Drug use.  Emotional well-being.  Home and relationship well-being.  Sexual activity.  Eating habits.  Work and work Statistician.  Method of birth control.  Menstrual cycle.  Pregnancy history.  Screening You may have the following tests or measurements:  Height, weight, and BMI.  Blood pressure.  Lipid and cholesterol levels. These may be checked every 5 years, or more frequently if you are over 81 years old.  Skin check.  Lung cancer screening. You may have this screening every year starting at age 78 if you have a 30-pack-year history of smoking and currently smoke or have quit within the past 15 years.  Fecal occult blood test (FOBT) of the stool. You may have this test every year starting at age 65.  Flexible sigmoidoscopy or colonoscopy. You may have a sigmoidoscopy every 5 years or a colonoscopy  every 10 years starting at age 30.  Hepatitis C blood test.  Hepatitis B blood test.  Sexually transmitted disease (STD) testing.  Diabetes screening. This is done by checking your blood sugar (glucose) after you have not eaten for a while (fasting). You may have this done every 1-3 years.  Mammogram. This may be done every 1-2 years. Talk to your health care provider about when you should start having regular mammograms. This may depend on whether you have a family history of breast cancer.  BRCA-related cancer screening. This may be done if you have a family history of breast, ovarian, tubal, or peritoneal cancers.  Pelvic exam and Pap test. This may be done every 3 years starting at age 80. Starting at age 36, this may be done every 5 years if you have a Pap test in combination with an HPV test.  Bone density scan. This is done to screen for osteoporosis. You may have this scan if you are at high risk for osteoporosis.  Discuss your test results, treatment options, and if necessary, the need for more tests with your health care provider. Vaccines Your health care provider may recommend certain vaccines, such as:  Influenza vaccine. This is recommended every year.  Tetanus, diphtheria, and acellular pertussis (Tdap, Td) vaccine. You may need a Td booster every 10 years.  Varicella vaccine. You may need this if you have not been vaccinated.  Zoster vaccine. You may need this after age 5.  Measles, mumps, and rubella (MMR) vaccine. You may need at least one dose of MMR if you were born in  1958 or later. You may also need a second dose.  Pneumococcal 13-valent conjugate (PCV13) vaccine. You may need this if you have certain conditions and were not previously vaccinated.  Pneumococcal polysaccharide (PPSV23) vaccine. You may need one or two doses if you smoke cigarettes or if you have certain conditions.  Meningococcal vaccine. You may need this if you have certain  conditions.  Hepatitis A vaccine. You may need this if you have certain conditions or if you travel or work in places where you may be exposed to hepatitis A.  Hepatitis B vaccine. You may need this if you have certain conditions or if you travel or work in places where you may be exposed to hepatitis B.  Haemophilus influenzae type b (Hib) vaccine. You may need this if you have certain conditions.  Talk to your health care provider about which screenings and vaccines you need and how often you need them. This information is not intended to replace advice given to you by your health care provider. Make sure you discuss any questions you have with your health care provider. Document Released: 07/05/2015 Document Revised: 02/26/2016 Document Reviewed: 04/09/2015 Elsevier Interactive Patient Education  2018 Elsevier Inc.  

## 2018-05-12 NOTE — Progress Notes (Signed)
Name: Terri Tanner   MRN: [id number]    DOB: [birth year]   Date:05/12/2018       Progress Note  Subjective  Chief Complaint  Chief Complaint  Patient presents with  . Annual Exam    patient sleeps about 7hrs. patient eats pretty good and has lost 13lbs since Aug 2019  . Labs Only    patient is fasting  . Immunizations    declines flu shot  . Follow-up    HPI   Patient presents for annual CPE and follow up  DMII: with obesity and dyslipidemia, she was diagnosed on 01/2018, her hgbA1C was 6.8%, she has changed her diet, lost 14 lbs, avoiding sweets, taking Crestor and denies any side effects. Eye exam is up to date and we will obtain records. She denies polyphagia , polydipsia or polyuria.    Diet: cooking more at home, eating chia seeds, avocado, more salads, less sweets      Office Visit from 05/12/2018 in [facility]  AUDIT-C Score  1     Depression:  Depression screen [facility] 2/9 05/12/2018 02/04/2018  Decreased Interest 0 0  Down, Depressed, Hopeless 0 0  PHQ - 2 Score 0 0  Altered sleeping 0 0  Tired, decreased energy 0 2  Change in appetite 0 1  Feeling bad or failure about yourself  0 0  Trouble concentrating 0 0  Moving slowly or fidgety/restless 0 0  Suicidal thoughts 0 0  PHQ-9 Score 0 3  Difficult doing work/chores Not difficult at all Not difficult at all   Hypertension: BP Readings from Last 3 Encounters:  05/12/18 106/68  04/27/18 107/75  04/14/18 121/74   Obesity: Wt Readings from Last 3 Encounters:  05/12/18 160 lb 12.8 oz (72.9 kg)  04/27/18 165 lb 3.2 oz (74.9 kg)  04/14/18 161 lb 12.8 oz (73.4 kg)   BMI Readings from Last 3 Encounters:  05/12/18 29.41 kg/m  04/27/18 29.26 kg/m  04/14/18 28.66 kg/m    Hep C Screening: up to date STD testing and prevention (HIV/chl/gon/syphilis): N/A Intimate partner violence:negative Sexual History/Pain during Intercourse: not sexually active since menopause 8 years ago   Menstrual History/LMP/Abnormal Bleeding: discussed post-menopausal bleeding  Incontinence Symptoms: none   Advanced Care Planning: A voluntary discussion about advance care planning including the explanation and discussion of advance directives.  Discussed health care proxy and Living will, and the patient was able to identify a health care proxy as husband  Patient does have a living will at present time.   Breast cancer: mammogram is up to date, discussed follow up with Dr. Dahlia Byes but she will wait until next mammogram  BRCA gene screening: N/A Cervical cancer screening: today   Osteoporosis Screening: taking vitamin D - she will think about bone density   Lipids:  Lab Results  Component Value Date   CHOL 270 (H) 02/04/2018   Lab Results  Component Value Date   HDL 46 (L) 02/04/2018   Lab Results  Component Value Date   [facility]  02/04/2018     Comment:     . LDL cholesterol not calculated. Triglyceride levels greater than 400 mg/dL invalidate calculated LDL results. . Reference range: <100 . Desirable range <100 mg/dL for primary prevention;   <70 mg/dL for patients with CHD or diabetic patients  with > or = 2 CHD risk factors. Marland Kitchen LDL-C is now calculated using the Martin-Hopkins  calculation, which is a validated novel method providing  better accuracy than the  Friedewald equation in the  estimation of LDL-C.  Cresenciano Genre et al. Annamaria Helling. 2751;700(17): 2061-2068  (http://education.[employer].com/faq/FAQ164)    Lab Results  Component Value Date   TRIG 492 (H) 02/04/2018   Lab Results  Component Value Date   CHOLHDL 5.9 (H) 02/04/2018   No results found for: LDLDIRECT  Glucose:  Glucose, Bld  Date Value Ref Range Status  02/04/2018 117 65 - 139 mg/dL Final    Comment:    .        Non-fasting reference interval .    Glucose-Capillary  Date Value Ref Range Status  04/14/2018 116 (H) 70 - 99 mg/dL Final    Skin cancer: discussed change in lesions   Colorectal cancer: up to date  Lung cancer:   Low Dose CT Chest recommended if Age 62-80 years, 30 pack-year currently smoking OR have quit w/in 15years. Patient does not qualify.   ECG:10/2017  Patient Active Problem List   Diagnosis Date Noted  . Long-term use of high-risk medication 05/12/2018  . Rectal polyp   . Diabetes mellitus type 2 in obese ([place]) 02/06/2018  . Dyslipidemia associated with type 2 diabetes mellitus ([place]) 02/06/2018  . GERD (gastroesophageal reflux disease) 02/04/2018  . Vitamin D deficiency 02/04/2018  . Chronic cough 02/04/2018  . Obesity (BMI 30-39.9) 02/04/2018    Past Surgical History:  Procedure Laterality Date  . CERVICAL POLYPECTOMY    . CESAREAN SECTION    . COLONOSCOPY WITH PROPOFOL N/A 03/03/2018   Procedure: COLONOSCOPY WITH PROPOFOL;  Surgeon: Jonathon Bellows, MD;  Location: [facility] ENDOSCOPY;  Service: Gastroenterology;  Laterality: N/A;  . TRANSANAL EXCISION OF RECTAL MASS N/A 04/14/2018   Procedure: TRANSANAL EXCISION OF RECTAL POLYP;  Surgeon: Jules Husbands, MD;  Location: [facility] ORS;  Service: General;  Laterality: N/A;  . WISDOM TOOTH EXTRACTION    . WISDOM TOOTH EXTRACTION      Family History  Problem Relation Age of Onset  . Lung cancer Mother        smoked for many years  . COPD Mother   . Emphysema Mother   . Esophageal cancer Father        smoker  . Suicidality Father   . Stroke Brother   . Alcohol abuse Brother   . Other Brother        huge smoker  . Diabetes Sister   . Breast cancer Neg Hx     Social History   Socioeconomic History  . Marital status: Married    Spouse name: Alvester Chou  . Number of children: 1  . Years of education: Not on file  . Highest education level: High school graduate  Occupational History  . Occupation: stay at home   Social Needs  . Financial resource strain: Not hard at all  . Food insecurity:    Worry: Never true    Inability: Never true  . Transportation needs:    Medical: No     Non-medical: No  Tobacco Use  . Smoking status: Never Smoker  . Smokeless tobacco: Never Used  Substance and Sexual Activity  . Alcohol use: Yes    Comment: rarely  . Drug use: Never  . Sexual activity: Not Currently    Partners: Male    Birth control/protection: None, Surgical  Lifestyle  . Physical activity:    Days per week: 0 days    Minutes per session: 0 min  . Stress: Not at all  Relationships  . Social connections:    Talks on  phone: More than three times a week    Gets together: More than three times a week    Attends religious service: More than 4 times per year    Active member of club or organization: Yes    Attends meetings of clubs or organizations: 1 to 4 times per year    Relationship status: Married  . Intimate partner violence:    Fear of current or ex partner: No    Emotionally abused: No    Physically abused: No    Forced sexual activity: No  Other Topics Concern  . Not on file  Social History Narrative   Patient  moved here from [place], [place] 07/2017   They moved in with their only daughter to home school the grandchildren     Current Outpatient Medications:  .  aspirin EC 81 MG tablet, Take 81 mg by mouth daily., Disp: , Rfl:  .  Cholecalciferol (VITAMIN D) 2000 units CAPS, Take 2,000 Units by mouth daily. , Disp: , Rfl:  .  Multiple Vitamins-Minerals (MULTIVITAMIN PO), Take 1 tablet by mouth daily., Disp: , Rfl:  .  NONFORMULARY OR COMPOUNDED ITEM, See phamrayc (Patient taking differently: Apply 1 application topically daily. Wart Cream Compound), Disp: 120 each, Rfl: 2 .  rosuvastatin (CRESTOR) 20 MG tablet, Take 1 tablet (20 mg total) by mouth daily. Start with half first week after that one pill (Patient taking differently: Take 20 mg by mouth every morning. ), Disp: 30 tablet, Rfl: 2 .  loratadine (CLARITIN) 10 MG tablet, Take 10 mg by mouth daily., Disp: , Rfl:   No Known Allergies   ROS  Constitutional: Negative for fever, positive  for  weight change.  Respiratory: Negative for cough and shortness of breath.   Cardiovascular: Negative for chest pain or palpitations.  Gastrointestinal: Negative for abdominal pain, no bowel changes.  Musculoskeletal: Negative for gait problem or joint swelling.  Skin: Negative for rash.  Neurological: Negative for dizziness or headache.  No other specific complaints in a complete review of systems (except as listed in HPI above).   Objective  Vitals:   05/12/18 0815  BP: 106/68  Pulse: 100  Resp: 16  Temp: 98 F (36.7 C)  TempSrc: Oral  SpO2: 97%  Weight: 160 lb 12.8 oz (72.9 kg)  Height: '5\' 2"'$  (1.575 m)    Body mass index is 29.41 kg/m.  Physical Exam  Constitutional: Patient appears well-developed and overeweight. No distress.  HENT: Head: Normocephalic and atraumatic. Ears: B TMs ok, no erythema or effusion; Nose: Nose normal. Mouth/Throat: Oropharynx is clear and moist. No oropharyngeal exudate.  Eyes: Conjunctivae and EOM are normal. Pupils are equal, round, and reactive to light. No scleral icterus.  Neck: Normal range of motion. Neck supple. No JVD present. No thyromegaly present.  Cardiovascular: Normal rate, regular rhythm and normal heart sounds.  No murmur heard. No BLE edema. Pulmonary/Chest: Effort normal and breath sounds normal. No respiratory distress. Abdominal: Soft. Bowel sounds are normal, no distension. There is no tenderness. no masses Breast: no lumps or masses, no nipple discharge or rashes FEMALE GENITALIA:  External genitalia normal External urethra normal Vaginal wall are dry and some stenosis Cervix normal without discharge or lesions Bimanual exam normal without masses RECTAL: not done Musculoskeletal: Normal range of motion, no joint effusions. No gross deformities Neurological: he is alert and oriented to person, place, and time. No cranial nerve deficit. Coordination, balance, strength, speech and gait are normal.  Skin: Skin is warm  ,  very dry  No rash noted. No erythema.  Psychiatric: Patient has a normal mood and affect. behavior is normal. Judgment and thought content normal.   Recent Results (from the past 2160 hour(s))  Surgical pathology     Status: None   Collection Time: 04/14/18 11:52 AM  Result Value Ref Range   SURGICAL PATHOLOGY      Surgical Pathology CASE: [id number] PATIENT: Heather Roberts Surgical Pathology Report     SPECIMEN SUBMITTED: A. Rectum polyp  CLINICAL HISTORY: None provided  PRE-OPERATIVE DIAGNOSIS: Anal polyp  POST-OPERATIVE DIAGNOSIS: None provided.     DIAGNOSIS: A.  RECTUM POLYP; TRANSANAL EXCISION: - HEMORRHOID AND OVERLYING FIBROEPITHELIAL POLYP SURFACED BY STRATIFIED SQUAMOUS EPITHELIUM. - NEGATIVE FOR DYSPLASIA AND MALIGNANCY.  GROSS DESCRIPTION: A. Labeled: Rectal polyp Received: In formalin Tissue fragment(s): 1 Size: 1.9 x 0.9 x 0.6 cm Description: Pedunculated red-tan fragment, marked black at the base, trisected Entirely submitted in one cassette.    Final Diagnosis performed by Bryan Lemma, MD.   Electronically signed 04/16/2018 5:16:03PM The electronic signature indicates that the named Attending Pathologist has evaluated the specimen  Technical component performed at [facility], [address], [place], [place] [address] Lab: [id number] Dir: Sa Lenard Galloway, MD, MMM  Professional component performed at [facility], [facility], [place], [place], [place] [address] Lab: [id number] Dir: Dellia Nims. Rubinas, MD   Glucose, capillary     Status: Abnormal   Collection Time: 04/14/18 12:36 PM  Result Value Ref Range   Glucose-Capillary 116 (H) 70 - 99 mg/dL   Comment 1 Notify RN     Diabetic Foot Exam: Diabetic Foot Exam - Simple   Simple Foot Form Diabetic Foot exam was performed with the following findings:  Yes 05/12/2018  9:03 AM  Visual Inspection No deformities, no ulcerations, no other skin breakdown bilaterally:   Yes Sensation Testing Intact to touch and monofilament testing bilaterally:  Yes Pulse Check Posterior Tibialis and Dorsalis pulse intact bilaterally:  Yes Comments      PHQ2/9: Depression screen [facility] 2/9 05/12/2018 02/04/2018  Decreased Interest 0 0  Down, Depressed, Hopeless 0 0  PHQ - 2 Score 0 0  Altered sleeping 0 0  Tired, decreased energy 0 2  Change in appetite 0 1  Feeling bad or failure about yourself  0 0  Trouble concentrating 0 0  Moving slowly or fidgety/restless 0 0  Suicidal thoughts 0 0  PHQ-9 Score 0 3  Difficult doing work/chores Not difficult at all Not difficult at all     Fall Risk: Fall Risk  05/12/2018 04/27/2018 02/04/2018  Falls in the past year? 0 0 No     Functional Status Survey: Is the patient deaf or have difficulty hearing?: No Does the patient have difficulty seeing, even when wearing glasses/contacts?: No Does the patient have difficulty concentrating, remembering, or making decisions?: No Does the patient have difficulty walking or climbing stairs?: No Does the patient have difficulty dressing or bathing?: No Does the patient have difficulty doing errands alone such as visiting a doctor's office or shopping?: No   Assessment & Plan   1. Well woman exam   2. Cervical cancer screening  - Cytology - PAP  3. Dyslipidemia associated with type 2 diabetes mellitus (HCC)  - Hemoglobin A1c - Lipid panel - Urine Microalbumin w/creat. ratio  4. Long-term use of high-risk medication  - COMPLETE METABOLIC PANEL WITH GFR   -USPSTF grade A and B recommendations reviewed with patient; age-appropriate recommendations, preventive care,  screening tests, etc discussed and encouraged; healthy living encouraged; see AVS for patient education given to patient -Discussed importance of 150 minutes of physical activity weekly, eat two servings of fish weekly, eat one serving of tree nuts ( cashews, pistachios, pecans, almonds.Marland Kitchen) every other day,  eat 6 servings of fruit/vegetables daily and drink plenty of water and avoid sweet beverages.

## 2018-05-13 LAB — COMPLETE METABOLIC PANEL WITH GFR
AG RATIO: 1.7 (calc) (ref 1.0–2.5)
ALT: 19 U/L (ref 6–29)
AST: 18 U/L (ref 10–35)
Albumin: 4.5 g/dL (ref 3.6–5.1)
Alkaline phosphatase (APISO): 97 U/L (ref 33–130)
BILIRUBIN TOTAL: 0.5 mg/dL (ref 0.2–1.2)
BUN: 14 mg/dL (ref 7–25)
CHLORIDE: 103 mmol/L (ref 98–110)
CO2: 29 mmol/L (ref 20–32)
Calcium: 10 mg/dL (ref 8.6–10.4)
Creat: 0.82 mg/dL (ref 0.50–1.05)
GFR, EST AFRICAN AMERICAN: 91 mL/min/{1.73_m2} (ref >=60)
GFR, Est Non African American: 79 mL/min/{1.73_m2} (ref >=60)
Globulin: 2.7 g/dL (calc) (ref 1.9–3.7)
Glucose, Bld: 113 mg/dL — ABNORMAL HIGH (ref 65–99)
POTASSIUM: 4 mmol/L (ref 3.5–5.3)
SODIUM: 142 mmol/L (ref 135–146)
TOTAL PROTEIN: 7.2 g/dL (ref 6.1–8.1)

## 2018-05-13 LAB — MICROALBUMIN / CREATININE URINE RATIO
CREATININE, URINE: 183 mg/dL (ref 20–275)
MICROALB UR: 0.7 mg/dL
MICROALB/CREAT RATIO: 4 ug/mg{creat} (ref <30)

## 2018-05-13 LAB — CYTOLOGY - PAP
DIAGNOSIS: NEGATIVE
HPV: NOT DETECTED

## 2018-05-13 LAB — LIPID PANEL
Cholesterol: 172 mg/dL (ref <200)
HDL: 49 mg/dL — ABNORMAL LOW (ref >50)
LDL Cholesterol (Calc): 90 mg/dL (calc)
Non-HDL Cholesterol (Calc): 123 mg/dL (calc) (ref <130)
Total CHOL/HDL Ratio: 3.5 (calc) (ref <5.0)
Triglycerides: 252 mg/dL — ABNORMAL HIGH (ref <150)

## 2018-05-13 LAB — HEMOGLOBIN A1C
HEMOGLOBIN A1C: 6.4 %{Hb} — AB (ref <5.7)
MEAN PLASMA GLUCOSE: 137 (calc)
eAG (mmol/L): 7.6 (calc)

## 2018-05-31 ENCOUNTER — Ambulatory Visit: Payer: BLUE CROSS/BLUE SHIELD | Admitting: Podiatry

## 2018-05-31 ENCOUNTER — Encounter: Payer: Self-pay | Admitting: Podiatry

## 2018-05-31 DIAGNOSIS — B079 Viral wart, unspecified: Secondary | ICD-10-CM

## 2018-06-01 NOTE — Progress Notes (Signed)
   HPI: 57 year old female presents the office today for follow up evaluation of multiple warts on the plantar aspect of the left foot. She reports some mild pain. Walking increases the pain. She has been using the [employer] Wart cream as directed. Patient is here for further evaluation and treatment.   Past Medical History:  Diagnosis Date  . Allergy   . Arthritis    just started in the right hand  . Diabetes mellitus type 2 in obese ([place]) 02/06/2018     Physical Exam: General: The patient is alert and oriented x3 in no acute distress.  Dermatology: Superficial hyperkeratotic verruca lesions noted x 4 to the left plantar foot.  Positive Auspitz sign and pinpoint bleeding.  Skin is warm, dry and supple bilateral lower extremities. Negative for open lesions or macerations.  Vascular: Palpable pedal pulses bilaterally. No edema or erythema noted. Capillary refill within normal limits.  Neurological: Epicritic and protective threshold grossly intact bilaterally.   Musculoskeletal Exam: Range of motion within normal limits to all pedal and ankle joints bilateral. Muscle strength 5/5 in all groups bilateral.   Assessment: 1. Superficial spreading plantar verruca x 4 left foot - improved    Plan of Care:  1. Patient evaluated.  2. Light debridement of the warts performed with a tissue nipper.  3. Recommended Silvadene cream for 2 weeks, then resume using [employer] Wart cream.  4. Salinocaine applied with a bandage.  5. Return to clinic in 3 months.       Edrick Kins, DPM [facility]  Dr. Edrick Kins, DPM    [address],  [address]                Office [phone]  Fax [phone]

## 2018-07-01 ENCOUNTER — Encounter: Payer: Self-pay | Admitting: Family Medicine

## 2018-07-04 ENCOUNTER — Other Ambulatory Visit: Payer: Self-pay | Admitting: Family Medicine

## 2018-07-04 MED ORDER — ATORVASTATIN CALCIUM 40 MG PO TABS: 40.0000 mg | ORAL_TABLET | Freq: Every day | ORAL | 1 refills | Status: DC

## 2018-07-09 ENCOUNTER — Encounter: Payer: Self-pay | Admitting: Podiatry

## 2018-07-11 MED ORDER — NONFORMULARY OR COMPOUNDED ITEM: 1 refills | Status: DC

## 2018-07-11 NOTE — Telephone Encounter (Signed)
Per Dr. Amalia Hailey, ok to refill wart cream.  Medication was called into [facility].  Patient was notified via voice mail.

## 2018-07-14 ENCOUNTER — Ambulatory Visit (INDEPENDENT_AMBULATORY_CARE_PROVIDER_SITE_OTHER): Payer: BLUE CROSS/BLUE SHIELD | Admitting: Family Medicine

## 2018-07-14 ENCOUNTER — Ambulatory Visit
Admission: RE | Admit: 2018-07-14 | Discharge: 2018-07-14 | Disposition: A | Discharge status: Home / Self Care | Payer: BLUE CROSS/BLUE SHIELD | Attending: Family Medicine | Admitting: Family Medicine

## 2018-07-14 ENCOUNTER — Ambulatory Visit
Admission: RE | Admit: 2018-07-14 | Discharge: 2018-07-14 | Disposition: A | Discharge status: Home / Self Care | Payer: BLUE CROSS/BLUE SHIELD | Source: Ambulatory Visit | Attending: Family Medicine | Admitting: Family Medicine

## 2018-07-14 ENCOUNTER — Encounter: Payer: Self-pay | Admitting: Family Medicine

## 2018-07-14 ENCOUNTER — Other Ambulatory Visit: Payer: Self-pay | Admitting: Family Medicine

## 2018-07-14 VITALS — BP 110/70 | HR 100 | Temp 98.5°F | Resp 16 | Ht 62.0 in | Wt 161.3 lb

## 2018-07-14 DIAGNOSIS — R1013 Epigastric pain: Secondary | ICD-10-CM | POA: Diagnosis not present

## 2018-07-14 DIAGNOSIS — R0789 Other chest pain: Secondary | ICD-10-CM | POA: Diagnosis not present

## 2018-07-14 DIAGNOSIS — R109 Unspecified abdominal pain: Secondary | ICD-10-CM

## 2018-07-14 LAB — CBC WITH DIFFERENTIAL/PLATELET
ABSOLUTE MONOCYTES: 518 {cells}/uL (ref 200–950)
Basophils Absolute: 63 cells/uL (ref 0–200)
Basophils Relative: 0.9 %
EOS ABS: 119 {cells}/uL (ref 15–500)
EOS PCT: 1.7 %
HEMATOCRIT: 43.2 % (ref 35.0–45.0)
HEMOGLOBIN: 14.9 g/dL (ref 11.7–15.5)
Lymphs Abs: 1715 cells/uL (ref 850–3900)
MCH: 30.3 pg (ref 27.0–33.0)
MCHC: 34.5 g/dL (ref 32.0–36.0)
MCV: 88 fL (ref 80.0–100.0)
MPV: 12 fL (ref 7.5–12.5)
Monocytes Relative: 7.4 %
NEUTROS ABS: 4585 {cells}/uL (ref 1500–7800)
Neutrophils Relative %: 65.5 %
Platelets: 234 10*3/uL (ref 140–400)
RBC: 4.91 10*6/uL (ref 3.80–5.10)
RDW: 12 % (ref 11.0–15.0)
Total Lymphocyte: 24.5 %
WBC: 7 10*3/uL (ref 3.8–10.8)

## 2018-07-14 LAB — COMPLETE METABOLIC PANEL WITH GFR
AG Ratio: 1.7 (calc) (ref 1.0–2.5)
ALKALINE PHOSPHATASE (APISO): 93 U/L (ref 33–130)
ALT: 19 U/L (ref 6–29)
AST: 16 U/L (ref 10–35)
Albumin: 4.5 g/dL (ref 3.6–5.1)
BUN: 14 mg/dL (ref 7–25)
CALCIUM: 10.3 mg/dL (ref 8.6–10.4)
CO2: 31 mmol/L (ref 20–32)
CREATININE: 0.78 mg/dL (ref 0.50–1.05)
Chloride: 105 mmol/L (ref 98–110)
GFR, EST NON AFRICAN AMERICAN: 84 mL/min/{1.73_m2} (ref >=60)
GFR, Est African American: 97 mL/min/{1.73_m2} (ref >=60)
GLUCOSE: 120 mg/dL — AB (ref 65–99)
Globulin: 2.7 g/dL (calc) (ref 1.9–3.7)
Potassium: 4.7 mmol/L (ref 3.5–5.3)
SODIUM: 143 mmol/L (ref 135–146)
Total Bilirubin: 0.4 mg/dL (ref 0.2–1.2)
Total Protein: 7.2 g/dL (ref 6.1–8.1)

## 2018-07-14 LAB — LIPASE: Lipase: 21 U/L (ref 7–60)

## 2018-07-14 LAB — POCT URINALYSIS DIPSTICK
BILIRUBIN UA: NEGATIVE
Glucose, UA: NEGATIVE
Ketones, UA: NEGATIVE
NITRITE UA: NEGATIVE
PROTEIN UA: POSITIVE — AB
RBC UA: NEGATIVE
Spec Grav, UA: 1.015 (ref 1.010–1.025)
UROBILINOGEN UA: 0.2 U/dL
pH, UA: 5 (ref 5.0–8.0)

## 2018-07-14 MED ORDER — FAMOTIDINE 40 MG PO TABS: 40.0000 mg | ORAL_TABLET | Freq: Every day | ORAL | 1 refills | Status: DC

## 2018-07-14 NOTE — Progress Notes (Signed)
Name: Terri Tanner   MRN: [id number]    DOB: 07-Oct-1960   Date:07/14/2018       Progress Note  Subjective  Chief Complaint  Chief Complaint  Patient presents with  . Gastroesophageal Reflux    pain moves around    HPI  PT presents with concern for chest discomfort x6 days - started out as tightness and progressed to intermittent discomfort. Discomfort is mainly located in the epigastric area, sometimes in her throat, sometimes into the LEFT breast and right flank.  The discomfort is intermittent - she is unsure of how long episodes last, but episodes are happening several times a day.  Had similar episode in the past, was told possibly allergies or GERD, took loratadine and this helped in the past, but this time it is not helping.  No cough, shortness of breath, fevers/chills, NVD, dysuria, hematuria.  Patient Active Problem List   Diagnosis Date Noted  . Long-term use of high-risk medication 05/12/2018  . Rectal polyp   . Diabetes mellitus type 2 in obese ([place]) 02/06/2018  . Dyslipidemia associated with type 2 diabetes mellitus ([place]) 02/06/2018  . GERD (gastroesophageal reflux disease) 02/04/2018  . Vitamin D deficiency 02/04/2018  . Chronic cough 02/04/2018  . Obesity (BMI 30-39.9) 02/04/2018    Social History   Tobacco Use  . Smoking status: Never Smoker  . Smokeless tobacco: Never Used  Substance Use Topics  . Alcohol use: Yes    Comment: rarely     Current Outpatient Medications:  .  aspirin EC 81 MG tablet, Take 81 mg by mouth daily., Disp: , Rfl:  .  atorvastatin (LIPITOR) 40 MG tablet, Take 1 tablet (40 mg total) by mouth daily., Disp: 90 tablet, Rfl: 1 .  Cholecalciferol (VITAMIN D) 2000 units CAPS, Take 2,000 Units by mouth daily. , Disp: , Rfl:  .  loratadine (CLARITIN) 10 MG tablet, Take 10 mg by mouth daily., Disp: , Rfl:  .  Multiple Vitamins-Minerals (MULTIVITAMIN PO), Take 1 tablet by mouth daily., Disp: , Rfl:  .  NONFORMULARY OR COMPOUNDED ITEM,  See phamrayc (Patient taking differently: Apply 1 application topically daily. Wart Cream Compound), Disp: 120 each, Rfl: 2 .  NONFORMULARY OR COMPOUNDED ITEM, See pharmacy note, Disp: 120 each, Rfl: 1 .  famotidine (PEPCID) 40 MG tablet, Take 1 tablet (40 mg total) by mouth daily., Disp: 30 tablet, Rfl: 1  No Known Allergies  I personally reviewed active problem list, medication list, allergies, health maintenance, notes from last encounter with the patient/caregiver today.  ROS  Ten systems reviewed and is negative except as mentioned in HPI.  Objective  Vitals:   07/14/18 0907  BP: 110/70  Pulse: 100  Resp: 16  Temp: 98.5 F (36.9 C)  TempSrc: Oral  SpO2: 98%  Weight: 161 lb 4.8 oz (73.2 kg)  Height: 5\' 2"  (1.575 m)   Body mass index is 29.5 kg/m.  Nursing Note and Vital Signs reviewed.  Physical Exam  Constitutional: Patient appears well-developed and well-nourished. No distress.  HENT: Head: Normocephalic and atraumatic.  Mouth/Throat: Oropharynx is clear and moist. No oropharyngeal exudate or tonsillar swelling.  Eyes: Conjunctivae and EOM are normal. No scleral icterus.  Pupils are equal, round, and reactive to light.  Neck: Normal range of motion. Neck supple. No JVD present. No thyromegaly present.  Cardiovascular: Normal rate, regular rhythm and normal heart sounds.  No murmur heard. No BLE edema. Chest pain is not reproducible with palpation. Pulmonary/Chest: Effort normal and  breath sounds normal. No respiratory distress. Abdominal: Soft. Bowel sounds are normal, no distension. There is no tenderness. No masses. No CVA tenderness. Musculoskeletal: Normal range of motion, no joint effusions. No gross deformities Neurological: Pt is alert and oriented to person, place, and time. No cranial nerve deficit. Coordination, balance, strength, speech and gait are normal.  Skin: Skin is warm and dry. No rash noted. No erythema.  Psychiatric: Patient has a normal mood and  affect. behavior is normal. Judgment and thought content normal.  Results for orders placed or performed in visit on 07/14/18 (from the past 72 hour(s))  POCT urinalysis dipstick     Status: Abnormal   Collection Time: 07/14/18 10:05 AM  Result Value Ref Range   Color, UA gold    Clarity, UA clear    Glucose, UA Negative Negative   Bilirubin, UA negative    Ketones, UA negative    Spec Grav, UA 1.015 1.010 - 1.025   Blood, UA negative    pH, UA 5.0 5.0 - 8.0   Protein, UA Positive (A) Negative   Urobilinogen, UA 0.2 0.2 or 1.0 E.U./dL   Nitrite, UA negative    Leukocytes, UA Moderate (2+) (A) Negative   Appearance clear    Odor none     Assessment & Plan  1. Chest discomfort - DG Chest 2 View; Future - EKG 12-Lead - sinus rhythm, appears unchanged from previous EKG - famotidine (PEPCID) 40 MG tablet; Take 1 tablet (40 mg total) by mouth daily.  Dispense: 30 tablet; Refill: 1 - DG Chest 2 View  2. Right flank pain - POCT urinalysis dipstick  3. Epigastric pain - COMPLETE METABOLIC PANEL WITH GFR - CBC w/Diff/Platelet - H. pylori antibody, IgG - Lipase - famotidine (PEPCID) 40 MG tablet; Take 1 tablet (40 mg total) by mouth daily.  Dispense: 30 tablet; Refill: 1  -Red flags and when to present for emergency care or RTC including fever >101.66F, chest pain, shortness of breath, new/worsening/un-resolving symptoms, reviewed with patient at time of visit. Follow up and care instructions discussed and provided in AVS. - Face-to-face time with patient was more than 25 minutes, >50% time spent counseling and coordination of care

## 2018-07-15 ENCOUNTER — Telehealth: Payer: Self-pay | Admitting: Family Medicine

## 2018-07-15 ENCOUNTER — Other Ambulatory Visit: Payer: Self-pay | Admitting: Family Medicine

## 2018-07-15 ENCOUNTER — Encounter: Payer: Self-pay | Admitting: Family Medicine

## 2018-07-15 DIAGNOSIS — A048 Other specified bacterial intestinal infections: Secondary | ICD-10-CM

## 2018-07-15 LAB — H. PYLORI BREATH TEST: H. pylori Breath Test: DETECTED — AB

## 2018-07-15 MED ORDER — AMOXICILLIN 500 MG PO TABS: 1000.0000 mg | ORAL_TABLET | Freq: Two times a day (BID) | ORAL | 0 refills | Status: AC

## 2018-07-15 MED ORDER — OMEPRAZOLE 20 MG PO CPDR: 20.0000 mg | DELAYED_RELEASE_CAPSULE | Freq: Two times a day (BID) | ORAL | 0 refills | Status: DC

## 2018-07-15 MED ORDER — CLARITHROMYCIN 500 MG PO TABS: 500.0000 mg | ORAL_TABLET | Freq: Two times a day (BID) | ORAL | 0 refills | Status: AC

## 2018-07-15 NOTE — Telephone Encounter (Signed)
Copied from [place] [id number]. Topic: General - Other >> Jul 15, 2018  9:26 AM Keene Breath wrote: Reason for CRM: Merry Proud with [facility] call to inform the doctor that there is a medication interaction between the patient's medication for clarithromycin (BIAXIN) 500 MG tablet and atorvastatin (LIPITOR) 40 MG tablet.  Pharmacist would like to know should the patient continue to take this medication or should it be changed.  Please advise and call back at [phone]

## 2018-07-15 NOTE — Telephone Encounter (Signed)
Pharmacy notified.

## 2018-07-15 NOTE — Telephone Encounter (Signed)
She can take half of lipitor and take biaxin

## 2018-07-20 ENCOUNTER — Encounter: Payer: Self-pay | Admitting: Family Medicine

## 2018-08-01 ENCOUNTER — Encounter: Payer: Self-pay | Admitting: Family Medicine

## 2018-08-01 DIAGNOSIS — A048 Other specified bacterial intestinal infections: Secondary | ICD-10-CM

## 2018-08-12 LAB — H. PYLORI BREATH TEST: H. pylori Breath Test: NOT DETECTED

## 2018-08-30 ENCOUNTER — Ambulatory Visit: Payer: BLUE CROSS/BLUE SHIELD | Admitting: Podiatry

## 2018-10-11 ENCOUNTER — Encounter: Payer: Self-pay | Admitting: Family Medicine

## 2018-11-06 ENCOUNTER — Encounter: Payer: Self-pay | Admitting: Family Medicine

## 2018-11-10 ENCOUNTER — Encounter: Payer: Self-pay | Admitting: Family Medicine

## 2018-11-10 ENCOUNTER — Other Ambulatory Visit: Payer: Self-pay

## 2018-11-10 ENCOUNTER — Ambulatory Visit: Payer: Self-pay | Admitting: Family Medicine

## 2018-11-10 ENCOUNTER — Ambulatory Visit (INDEPENDENT_AMBULATORY_CARE_PROVIDER_SITE_OTHER): Payer: BLUE CROSS/BLUE SHIELD | Admitting: Family Medicine

## 2018-11-10 VITALS — BP 124/82 | HR 99 | Temp 98.2°F | Resp 14 | Ht 62.0 in | Wt 163.3 lb

## 2018-11-10 DIAGNOSIS — E559 Vitamin D deficiency, unspecified: Secondary | ICD-10-CM | POA: Diagnosis not present

## 2018-11-10 DIAGNOSIS — K219 Gastro-esophageal reflux disease without esophagitis: Secondary | ICD-10-CM | POA: Diagnosis not present

## 2018-11-10 DIAGNOSIS — E785 Hyperlipidemia, unspecified: Secondary | ICD-10-CM

## 2018-11-10 DIAGNOSIS — E118 Type 2 diabetes mellitus with unspecified complications: Secondary | ICD-10-CM

## 2018-11-10 DIAGNOSIS — Z1239 Encounter for other screening for malignant neoplasm of breast: Secondary | ICD-10-CM

## 2018-11-10 DIAGNOSIS — E1169 Type 2 diabetes mellitus with other specified complication: Secondary | ICD-10-CM | POA: Diagnosis not present

## 2018-11-10 LAB — POCT GLYCOSYLATED HEMOGLOBIN (HGB A1C): HbA1c, POC (controlled diabetic range): 6.1 % (ref 0.0–7.0)

## 2018-11-10 MED ORDER — ATORVASTATIN CALCIUM 40 MG PO TABS: 40.0000 mg | ORAL_TABLET | Freq: Every day | ORAL | 1 refills | Status: DC

## 2018-11-10 MED ORDER — OMEPRAZOLE 20 MG PO CPDR: 20.0000 mg | DELAYED_RELEASE_CAPSULE | Freq: Every day | ORAL | 0 refills | Status: DC

## 2018-11-10 NOTE — Telephone Encounter (Signed)
Called and spoke with the patient and she said that once she got in the shower it is not hurting anymore so she is coming on in and advised her to make sure and wear her mask and she said that she does not have one and I told her that we would give her one when she entered the office.

## 2018-11-10 NOTE — Progress Notes (Signed)
Name: Terri Tanner   MRN: [id number]    DOB: 1961-03-19   Date:11/10/2018       Progress Note  Subjective  Chief Complaint  Chief Complaint  Patient presents with  . Follow-up  . Gastroesophageal Reflux  . Hyperlipidemia  . Medication Refill    HPI  DMII: with obesity and dyslipidemia, she was diagnosed on 01/2018, her hgbA1C was 6.8%, she has changed her diet, last A1C was down to 6.4% and today it is down to 6.1%  , avoiding sweets, taking Atorvastatin now since Crestor was too expensive. She  denies any side effects. Eye exam is up to date and we will obtain records. She denies polyphagia , polydipsia or polyuria.  GERD: she was treated to h. Pylori back in the beginning of the year, no dyspepsia or pain at this time, discussed weaning self off PPI and she will try    Vitamin D : she is taking otc supplementation   Patient Active Problem List   Diagnosis Date Noted  . Long-term use of high-risk medication 05/12/2018  . Rectal polyp   . Diabetes mellitus type 2 in obese ([place]) 02/06/2018  . Dyslipidemia associated with type 2 diabetes mellitus ([place]) 02/06/2018  . GERD (gastroesophageal reflux disease) 02/04/2018  . Vitamin D deficiency 02/04/2018  . Chronic cough 02/04/2018  . Obesity (BMI 30-39.9) 02/04/2018    Past Surgical History:  Procedure Laterality Date  . CERVICAL POLYPECTOMY    . CESAREAN SECTION    . COLONOSCOPY WITH PROPOFOL N/A 03/03/2018   Procedure: COLONOSCOPY WITH PROPOFOL;  Surgeon: Jonathon Bellows, MD;  Location: [facility] ENDOSCOPY;  Service: Gastroenterology;  Laterality: N/A;  . TRANSANAL EXCISION OF RECTAL MASS N/A 04/14/2018   Procedure: TRANSANAL EXCISION OF RECTAL POLYP;  Surgeon: Jules Husbands, MD;  Location: [facility] ORS;  Service: General;  Laterality: N/A;  . WISDOM TOOTH EXTRACTION    . WISDOM TOOTH EXTRACTION      Family History  Problem Relation Age of Onset  . Lung cancer Mother        smoked for many years  . COPD Mother   . Emphysema  Mother   . Esophageal cancer Father        smoker  . Suicidality Father   . Stroke Brother   . Alcohol abuse Brother   . Other Brother        huge smoker  . Diabetes Sister   . Breast cancer Neg Hx     Social History   Socioeconomic History  . Marital status: Married    Spouse name: Alvester Chou  . Number of children: 1  . Years of education: Not on file  . Highest education level: High school graduate  Occupational History  . Occupation: stay at home   Social Needs  . Financial resource strain: Not hard at all  . Food insecurity:    Worry: Never true    Inability: Never true  . Transportation needs:    Medical: No    Non-medical: No  Tobacco Use  . Smoking status: Never Smoker  . Smokeless tobacco: Never Used  Substance and Sexual Activity  . Alcohol use: Yes    Comment: rarely  . Drug use: Never  . Sexual activity: Not Currently    Partners: Male    Birth control/protection: None, Surgical  Lifestyle  . Physical activity:    Days per week: 0 days    Minutes per session: 0 min  . Stress: Not at all  Relationships  .  Social connections:    Talks on phone: More than three times a week    Gets together: More than three times a week    Attends religious service: More than 4 times per year    Active member of club or organization: Yes    Attends meetings of clubs or organizations: 1 to 4 times per year    Relationship status: Married  . Intimate partner violence:    Fear of current or ex partner: No    Emotionally abused: No    Physically abused: No    Forced sexual activity: No  Other Topics Concern  . Not on file  Social History Narrative   Patient  moved here from [place], [place] 07/2017   They moved in with their only daughter to home school the grandchildren     Current Outpatient Medications:  .  atorvastatin (LIPITOR) 40 MG tablet, Take 1 tablet (40 mg total) by mouth daily., Disp: 90 tablet, Rfl: 1 .  Cholecalciferol (VITAMIN D) 2000 units CAPS,  Take 2,000 Units by mouth daily. , Disp: , Rfl:  .  Multiple Vitamins-Minerals (MULTIVITAMIN PO), Take 1 tablet by mouth daily., Disp: , Rfl:  .  loratadine (CLARITIN) 10 MG tablet, Take 10 mg by mouth daily., Disp: , Rfl:  .  NONFORMULARY OR COMPOUNDED ITEM, See phamrayc (Patient not taking: Reported on 11/10/2018), Disp: 120 each, Rfl: 2 .  NONFORMULARY OR COMPOUNDED ITEM, See pharmacy note (Patient not taking: Reported on 11/10/2018), Disp: 120 each, Rfl: 1 .  omeprazole (PRILOSEC) 20 MG capsule, Take 1 capsule (20 mg total) by mouth 2 (two) times daily for 14 days., Disp: 28 capsule, Rfl: 0  No Known Allergies  I personally reviewed active problem list, medication list, allergies, family history, social history with the patient/caregiver today.   ROS  Constitutional: Negative for fever or weight change.  Respiratory: Negative for cough and shortness of breath.   Cardiovascular: Negative for chest pain or palpitations.  Gastrointestinal: Negative for abdominal pain, no bowel changes.  Musculoskeletal: Negative for gait problem or joint swelling.  Skin: Negative for rash.  Neurological: Negative for dizziness or headache.  No other specific complaints in a complete review of systems (except as listed in HPI above).  Objective  Vitals:   11/10/18 0811  BP: 124/82  Pulse: 99  Resp: 14  Temp: 98.2 F (36.8 C)  TempSrc: Oral  SpO2: 95%  Weight: 163 lb 4.8 oz (74.1 kg)  Height: 5\' 2"  (1.575 m)    Body mass index is 29.87 kg/m.  Physical Exam  Constitutional: Patient appears well-developed and well-nourished. Obese No distress.  HEENT: head atraumatic, normocephalic, pupils equal and reactive to light,  neck supple, throat within normal limits Cardiovascular: Normal rate, regular rhythm and normal heart sounds.  No murmur heard. No BLE edema. Pulmonary/Chest: Effort normal and breath sounds normal. No respiratory distress. Abdominal: Soft.  There is no  tenderness. Psychiatric: Patient has a normal mood and affect. behavior is normal. Judgment and thought content normal.  PHQ2/9: Depression screen [facility] 2/9 11/10/2018 07/14/2018 05/12/2018 02/04/2018  Decreased Interest 0 0 0 0  Down, Depressed, Hopeless 0 0 0 0  PHQ - 2 Score 0 0 0 0  Altered sleeping 0 0 0 0  Tired, decreased energy 0 0 0 2  Change in appetite 0 0 0 1  Feeling bad or failure about yourself  0 0 0 0  Trouble concentrating 0 0 0 0  Moving slowly or fidgety/restless 0  0 0 0  Suicidal thoughts 0 0 0 0  PHQ-9 Score 0 0 0 3  Difficult doing work/chores Not difficult at all Not difficult at all Not difficult at all Not difficult at all    phq 9 is negative   Fall Risk: Fall Risk  11/10/2018 07/14/2018 05/12/2018 04/27/2018 02/04/2018  Falls in the past year? 0 0 0 0 No  Number falls in past yr: 0 0 - - -  Injury with Fall? 0 0 - - -  Follow up - Falls evaluation completed - - -    Functional Status Survey: Is the patient deaf or have difficulty hearing?: No Does the patient have difficulty seeing, even when wearing glasses/contacts?: No Does the patient have difficulty concentrating, remembering, or making decisions?: No Does the patient have difficulty walking or climbing stairs?: No Does the patient have difficulty dressing or bathing?: No Does the patient have difficulty doing errands alone such as visiting a doctor's office or shopping?: No    Assessment & Plan   1. Diabetes mellitus type 2 in obese ([facility])  - POCT HgB A1C  2. GERD without esophagitis  Discussed how to wean self off omeprazole  - omeprazole (PRILOSEC) 20 MG capsule; Take 1 capsule (20 mg total) by mouth daily.  Dispense: 90 capsule; Refill: 0  3. Dyslipidemia associated with type 2 diabetes mellitus ([facility])  - atorvastatin (LIPITOR) 40 MG tablet; Take 1 tablet (40 mg total) by mouth daily.  Dispense: 90 tablet; Refill: 1  4. Vitamin D deficiency   5. Dyslipidemia  - atorvastatin  (LIPITOR) 40 MG tablet; Take 1 tablet (40 mg total) by mouth daily.  Dispense: 90 tablet; Refill: 1  6. Breast cancer screening  - MM Digital Screening; Future

## 2018-11-10 NOTE — Telephone Encounter (Signed)
I attempted to call pt back because the line got disconnected when [place] the agent tried to connect her with me. She woke up with a sore throat this morning and has a face to face appt with Dr. Ancil Boozer at 8:00.   Pt was wondering what to do. I left a message for pt not to come in and to call at 8:00 to be rescheduled.  I sent a high priority note to [facility] to make them aware of the situation.

## 2018-11-23 ENCOUNTER — Other Ambulatory Visit: Payer: Self-pay | Admitting: Family Medicine

## 2018-11-23 DIAGNOSIS — Z1231 Encounter for screening mammogram for malignant neoplasm of breast: Secondary | ICD-10-CM

## 2019-01-21 ENCOUNTER — Encounter: Payer: Self-pay | Admitting: Family Medicine

## 2019-02-09 ENCOUNTER — Ambulatory Visit: Payer: BLUE CROSS/BLUE SHIELD | Admitting: Family Medicine

## 2019-02-09 ENCOUNTER — Other Ambulatory Visit: Payer: Self-pay

## 2019-02-09 ENCOUNTER — Encounter: Payer: Self-pay | Admitting: Family Medicine

## 2019-02-09 VITALS — BP 110/70 | HR 113 | Temp 97.5°F | Resp 16 | Ht 62.0 in | Wt 166.8 lb

## 2019-02-09 DIAGNOSIS — L509 Urticaria, unspecified: Secondary | ICD-10-CM | POA: Diagnosis not present

## 2019-02-09 DIAGNOSIS — T783XXD Angioneurotic edema, subsequent encounter: Secondary | ICD-10-CM

## 2019-02-09 MED ORDER — HYDRALAZINE HCL 10 MG PO TABS: 10.0000 mg | ORAL_TABLET | Freq: Three times a day (TID) | ORAL | 0 refills | Status: DC | PRN

## 2019-02-09 MED ORDER — LORATADINE 10 MG PO TABS: 10.0000 mg | ORAL_TABLET | Freq: Two times a day (BID) | ORAL | 0 refills | Status: DC

## 2019-02-09 MED ORDER — FAMOTIDINE 20 MG PO TABS: 20.0000 mg | ORAL_TABLET | Freq: Two times a day (BID) | ORAL | 0 refills | Status: DC

## 2019-02-09 MED ORDER — EPINEPHRINE 0.3 MG/0.3ML IJ SOAJ: 0.3000 mg | INTRAMUSCULAR | 0 refills | Status: DC | PRN

## 2019-02-09 NOTE — Progress Notes (Signed)
Name: Terri Tanner   MRN: [id number]    DOB: 1961-05-22   Date:02/09/2019       Progress Note  Subjective  Chief Complaint  Chief Complaint  Patient presents with  . Urticaria    She complains of having hives more than a month. She has welps, swelling, itching and redness all over her body. It started out with her lips swelling on July 4. She has been treating symptoms with Loratadine and Benadryl with fair improvement.    HPI  Urticaria: she states symptoms started on July 4th, initially just angioedema of lips, never lasted a long time and did not cause wheezing, stridor or SOB. She states symptoms intermittent, however over the past 2 weeks she has noticed hives all over her body and some recurrence of angioedema. She is taking loratadine once a day, benadryl at night and itching is not very intense but is starting to bother her. She states they have moved - 2 weeks ago - but otherwise no change in stress level. She also states that only new product was a new shampoo that she uses a couple of times a week.   Patient Active Problem List   Diagnosis Date Noted  . Long-term use of high-risk medication 05/12/2018  . Rectal polyp   . Diabetes mellitus type 2 in obese ([place]) 02/06/2018  . Dyslipidemia associated with type 2 diabetes mellitus ([place]) 02/06/2018  . GERD (gastroesophageal reflux disease) 02/04/2018  . Vitamin D deficiency 02/04/2018  . Chronic cough 02/04/2018  . Obesity (BMI 30-39.9) 02/04/2018    Social History   Tobacco Use  . Smoking status: Never Smoker  . Smokeless tobacco: Never Used  Substance Use Topics  . Alcohol use: Yes    Comment: rarely     Current Outpatient Medications:  .  atorvastatin (LIPITOR) 40 MG tablet, Take 1 tablet (40 mg total) by mouth daily., Disp: 90 tablet, Rfl: 1 .  Cholecalciferol (VITAMIN D) 2000 units CAPS, Take 2,000 Units by mouth daily. , Disp: , Rfl:  .  loratadine (CLARITIN) 10 MG tablet, Take 1 tablet (10 mg total) by mouth 2  (two) times daily., Disp: 60 tablet, Rfl: 0 .  Multiple Vitamins-Minerals (MULTIVITAMIN PO), Take 1 tablet by mouth daily., Disp: , Rfl:  .  EPINEPHrine 0.3 mg/0.3 mL IJ SOAJ injection, Inject 0.3 mLs (0.3 mg total) into the muscle as needed for anaphylaxis., Disp: 2 each, Rfl: 0 .  famotidine (PEPCID) 20 MG tablet, Take 1 tablet (20 mg total) by mouth 2 (two) times daily., Disp: 60 tablet, Rfl: 0 .  hydrALAZINE (APRESOLINE) 10 MG tablet, Take 1 tablet (10 mg total) by mouth 3 (three) times daily as needed., Disp: 90 tablet, Rfl: 0  No Known Allergies  ROS  Ten systems reviewed and is negative except as mentioned in HPI   Objective  Vitals:   02/09/19 1100  BP: 110/70  Pulse: (!) 113  Resp: 16  Temp: (!) 97.5 F (36.4 C)  TempSrc: Temporal  SpO2: 98%  Weight: 166 lb 12.8 oz (75.7 kg)  Height: 5\' 2"  (1.575 m)    Body mass index is 30.51 kg/m.    Physical Exam  Constitutional: Patient appears well-developed and well-nourished. Obese  No distress.  HEENT: head atraumatic, normocephalic, pupils equal and reactive to light Cardiovascular: Normal rate, regular rhythm and normal heart sounds.  No murmur heard. No BLE edema. Pulmonary/Chest: Effort normal and breath sounds normal. No respiratory distress. Abdominal: Soft.  There is no tenderness.  Skin: hives well demarcated raised lesions in different shapes on arms, trunk and legs.  Psychiatric: Patient has a normal mood and affect. behavior is normal. Judgment and thought content normal.  Assessment & Plan   1. Hives  - Ambulatory referral to Allergy - CBC with Differential/Platelet -CRP  -sed rate  - EPINEPHrine 0.3 mg/0.3 mL IJ SOAJ injection; Inject 0.3 mLs (0.3 mg total) into the muscle as needed for anaphylaxis.  Dispense: 2 each; Refill: 0 - famotidine (PEPCID) 20 MG tablet; Take 1 tablet (20 mg total) by mouth 2 (two) times daily.  Dispense: 60 tablet; Refill: 0 - loratadine (CLARITIN) 10 MG tablet; Take 1 tablet  (10 mg total) by mouth 2 (two) times daily.  Dispense: 60 tablet; Refill: 0 - hydrALAZINE (APRESOLINE) 10 MG tablet; Take 1 tablet (10 mg total) by mouth 3 (three) times daily as needed.  Dispense: 90 tablet; Refill: 0  2. Angioedema of lips, subsequent encounter  - Ambulatory referral to Allergy - CBC with Differential/Platelet - EPINEPHrine 0.3 mg/0.3 mL IJ SOAJ injection; Inject 0.3 mLs (0.3 mg total) into the muscle as needed for anaphylaxis.  Dispense: 2 each; Refill: 0 - famotidine (PEPCID) 20 MG tablet; Take 1 tablet (20 mg total) by mouth 2 (two) times daily.  Dispense: 60 tablet; Refill: 0 - loratadine (CLARITIN) 10 MG tablet; Take 1 tablet (10 mg total) by mouth 2 (two) times daily.  Dispense: 60 tablet; Refill: 0 - hydrALAZINE (APRESOLINE) 10 MG tablet; Take 1 tablet (10 mg total) by mouth 3 (three) times daily as needed.  Dispense: 90 tablet; Refill: 0

## 2019-02-10 LAB — CBC WITH DIFFERENTIAL/PLATELET
Absolute Monocytes: 661 cells/uL (ref 200–950)
Basophils Absolute: 24 cells/uL (ref 0–200)
Basophils Relative: 0.2 %
Eosinophils Absolute: 71 cells/uL (ref 15–500)
Eosinophils Relative: 0.6 %
HCT: 43 % (ref 35.0–45.0)
Hemoglobin: 14.7 g/dL (ref 11.7–15.5)
Lymphs Abs: 1805 cells/uL (ref 850–3900)
MCH: 30.7 pg (ref 27.0–33.0)
MCHC: 34.2 g/dL (ref 32.0–36.0)
MCV: 89.8 fL (ref 80.0–100.0)
MPV: 11.9 fL (ref 7.5–12.5)
Monocytes Relative: 5.6 %
Neutro Abs: 9239 cells/uL — ABNORMAL HIGH (ref 1500–7800)
Neutrophils Relative %: 78.3 %
Platelets: 275 10*3/uL (ref 140–400)
RBC: 4.79 10*6/uL (ref 3.80–5.10)
RDW: 11.8 % (ref 11.0–15.0)
Total Lymphocyte: 15.3 %
WBC: 11.8 10*3/uL — ABNORMAL HIGH (ref 3.8–10.8)

## 2019-02-10 LAB — SEDIMENTATION RATE: Sed Rate: 6 mm/h (ref 0–30)

## 2019-02-10 LAB — C-REACTIVE PROTEIN: CRP: 27.6 mg/L — ABNORMAL HIGH (ref <8.0)

## 2019-02-11 ENCOUNTER — Encounter: Payer: Self-pay | Admitting: Family Medicine

## 2019-02-12 ENCOUNTER — Other Ambulatory Visit: Payer: Self-pay | Admitting: Family Medicine

## 2019-02-12 DIAGNOSIS — D72829 Elevated white blood cell count, unspecified: Secondary | ICD-10-CM

## 2019-02-13 ENCOUNTER — Other Ambulatory Visit: Payer: Self-pay | Admitting: Family Medicine

## 2019-02-13 MED ORDER — HYDROXYZINE HCL 10 MG PO TABS: 10.0000 mg | ORAL_TABLET | Freq: Three times a day (TID) | ORAL | 0 refills | Status: DC | PRN

## 2019-02-14 LAB — CBC WITH DIFFERENTIAL/PLATELET
Absolute Monocytes: 555 cells/uL (ref 200–950)
Basophils Absolute: 22 cells/uL (ref 0–200)
Basophils Relative: 0.2 %
Eosinophils Absolute: 67 cells/uL (ref 15–500)
Eosinophils Relative: 0.6 %
HCT: 43.1 % (ref 35.0–45.0)
Hemoglobin: 14.7 g/dL (ref 11.7–15.5)
Lymphs Abs: 1621 cells/uL (ref 850–3900)
MCH: 30.1 pg (ref 27.0–33.0)
MCHC: 34.1 g/dL (ref 32.0–36.0)
MCV: 88.1 fL (ref 80.0–100.0)
MPV: 11.3 fL (ref 7.5–12.5)
Monocytes Relative: 5 %
Neutro Abs: 8836 cells/uL — ABNORMAL HIGH (ref 1500–7800)
Neutrophils Relative %: 79.6 %
Platelets: 275 10*3/uL (ref 140–400)
RBC: 4.89 10*6/uL (ref 3.80–5.10)
RDW: 11.6 % (ref 11.0–15.0)
Total Lymphocyte: 14.6 %
WBC: 11.1 10*3/uL — ABNORMAL HIGH (ref 3.8–10.8)

## 2019-02-14 LAB — C-REACTIVE PROTEIN: CRP: 27.3 mg/L — ABNORMAL HIGH (ref <8.0)

## 2019-02-15 ENCOUNTER — Encounter: Payer: Self-pay | Admitting: Family Medicine

## 2019-02-15 ENCOUNTER — Other Ambulatory Visit: Payer: Self-pay | Admitting: Family Medicine

## 2019-02-15 DIAGNOSIS — D72829 Elevated white blood cell count, unspecified: Secondary | ICD-10-CM

## 2019-02-15 DIAGNOSIS — L509 Urticaria, unspecified: Secondary | ICD-10-CM

## 2019-02-17 LAB — COMPLETE METABOLIC PANEL WITH GFR
AG Ratio: 1.6 (calc) (ref 1.0–2.5)
ALT: 17 U/L (ref 6–29)
AST: 17 U/L (ref 10–35)
Albumin: 3.9 g/dL (ref 3.6–5.1)
Alkaline phosphatase (APISO): 88 U/L (ref 37–153)
BUN: 10 mg/dL (ref 7–25)
CO2: 26 mmol/L (ref 20–32)
Calcium: 9.6 mg/dL (ref 8.6–10.4)
Chloride: 103 mmol/L (ref 98–110)
Creat: 0.76 mg/dL (ref 0.50–1.05)
GFR, Est African American: 100 mL/min/{1.73_m2} (ref >=60)
GFR, Est Non African American: 86 mL/min/{1.73_m2} (ref >=60)
Globulin: 2.4 g/dL (calc) (ref 1.9–3.7)
Glucose, Bld: 161 mg/dL — ABNORMAL HIGH (ref 65–99)
Potassium: 4 mmol/L (ref 3.5–5.3)
Sodium: 139 mmol/L (ref 135–146)
Total Bilirubin: 0.5 mg/dL (ref 0.2–1.2)
Total Protein: 6.3 g/dL (ref 6.1–8.1)

## 2019-02-17 LAB — CBC WITH DIFFERENTIAL/PLATELET
Absolute Monocytes: 598 cells/uL (ref 200–950)
Basophils Absolute: 14 cells/uL (ref 0–200)
Basophils Relative: 0.1 %
Eosinophils Absolute: 82 cells/uL (ref 15–500)
Eosinophils Relative: 0.6 %
HCT: 44 % (ref 35.0–45.0)
Hemoglobin: 14.8 g/dL (ref 11.7–15.5)
Lymphs Abs: 1727 cells/uL (ref 850–3900)
MCH: 29.9 pg (ref 27.0–33.0)
MCHC: 33.6 g/dL (ref 32.0–36.0)
MCV: 88.9 fL (ref 80.0–100.0)
MPV: 10.9 fL (ref 7.5–12.5)
Monocytes Relative: 4.4 %
Neutro Abs: 11179 cells/uL — ABNORMAL HIGH (ref 1500–7800)
Neutrophils Relative %: 82.2 %
Platelets: 312 10*3/uL (ref 140–400)
RBC: 4.95 10*6/uL (ref 3.80–5.10)
RDW: 11.7 % (ref 11.0–15.0)
Total Lymphocyte: 12.7 %
WBC: 13.6 10*3/uL — ABNORMAL HIGH (ref 3.8–10.8)

## 2019-02-17 LAB — C3 AND C4
C3 Complement: 162 mg/dL (ref 83–193)
C4 Complement: 41 mg/dL (ref 15–57)

## 2019-02-21 ENCOUNTER — Other Ambulatory Visit: Payer: Self-pay

## 2019-02-21 NOTE — Progress Notes (Signed)
[facility]  [address], [address] [place], [place] [address] Phone: [phone]  Fax: [phone]   Clinic Day:  02/22/2019  Referring physician: Steele Sizer, MD  Chief Complaint: Terri Tanner is a 58 y.o. female with chronic urticaria and leukocytosis who is referred in consultation by Dr Steele Sizer for assessment and management.   HPI:   The patient notes urticaria beginning around 12/24/2018.  She initially describes swelling of her lips without notes of breath or wheezing.  Symptoms were intermittent.  She then noted diffuse hives.  There were no precipitating events, drugs, food, insect bites or infections.  She initially thought symptoms were due to a new shampoo but this was stopped weeks ago and symptoms of urticaria and intermittent lip swelling have continued.  Patient recollects a history of H. Pylori in 06/2018.  She was placed on amoxicillin, omeprazole, and clarithromycin.  She did not take famotidine.  She denies any other medications or herbal products.  He denies any travel.  Labs followed: 07/14/2018: Hematocrit 43.2, hemoglobin 14.9, MCV 88.0, platelets 234,000, WBC 7,000 (ANC 4,585).  02/09/2019: Hematocrit 43.0, hemoglobin 14.7, MCV 89.8, platelets 275,000, WBC 11,800 (ANC 9,239).  CRP 27.6. Sed rate 6.  02/13/2019: Hematocrit 43.1, hemoglobin 14.7, MCV 88.1, platelets 275,000, WBC 11,100 (ANC 8,836).  CRP 27.3.  02/16/2019: Hematocrit 44.0, hemoglobin 14.8, MCV 88.9, platelets 312,000, WBC 13,600 (ANC 11,179).  C3 162 and C4 41.   Symptomatically, she notes weight loss of 3 pounds intentionally.  She has been eating "clean" for the past year.  She has been cooking at home with no fried foods.  She denies any shortness of breath but does note a sore throat associated with "hives in the throat".  She describes chest pain secondary to reflux.  She denies any melena or hematochezia.  Colonoscopy x 2 was negative.  She denies any bone or  joint pain.  She denies any after bath pruritus, but notes that it causes her "rash to come out".  She denies any alcohol or tobacco use.  She denies any exposure to radiation or toxins.   Past Medical History:  Diagnosis Date  . Allergy   . Diabetes mellitus type 2 in obese ([place]) 02/06/2018    Past Surgical History:  Procedure Laterality Date  . CERVICAL POLYPECTOMY    . CESAREAN SECTION    . COLONOSCOPY WITH PROPOFOL N/A 03/03/2018   Procedure: COLONOSCOPY WITH PROPOFOL;  Surgeon: Jonathon Bellows, MD;  Location: [facility] ENDOSCOPY;  Service: Gastroenterology;  Laterality: N/A;  . TRANSANAL EXCISION OF RECTAL MASS N/A 04/14/2018   Procedure: TRANSANAL EXCISION OF RECTAL POLYP;  Surgeon: Jules Husbands, MD;  Location: [facility] ORS;  Service: General;  Laterality: N/A;  . WISDOM TOOTH EXTRACTION    . WISDOM TOOTH EXTRACTION      Family History  Problem Relation Age of Onset  . Lung cancer Mother        smoked for many years  . COPD Mother   . Emphysema Mother   . Esophageal cancer Father        smoker  . Suicidality Father   . Cancer Sister   . Stroke Brother   . Alcohol abuse Brother   . Other Brother        huge smoker  . Diabetes Sister   . Breast cancer Neg Hx     Social History:  reports that she has never smoked. She has never used smokeless tobacco. She reports current alcohol use.  She reports that she does not use drugs.  She denies any exposure to radiation or toxins.  She lives with her daughter and granddaughter in [place].  She moved in July.  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  She is alone today.  Allergies: No Known Allergies  Current Medications: Current Outpatient Medications  Medication Sig Dispense Refill  . atorvastatin (LIPITOR) 40 MG tablet Take 1 tablet (40 mg total) by mouth daily. 90 tablet 1  . Cholecalciferol (VITAMIN D) 2000 units CAPS Take 2,000 Units by mouth daily.     Marland Kitchen EPINEPHrine 0.3 mg/0.3 mL IJ SOAJ injection Inject 0.3 mLs (0.3 mg total) into  the muscle as needed for anaphylaxis. 2 each 0  . Multiple Vitamins-Minerals (MULTIVITAMIN PO) Take 1 tablet by mouth daily.    Marland Kitchen loratadine (CLARITIN) 10 MG tablet Take 1 tablet (10 mg total) by mouth 2 (two) times daily. (Patient not taking: Reported on 02/22/2019) 60 tablet 0   No current facility-administered medications for this visit.     Review of Systems  Constitutional: Negative.  Negative for chills, diaphoresis, fever, malaise/fatigue and weight loss (intentional).  HENT: Positive for sore throat (when "hives in throat"). Negative for congestion, ear discharge, ear pain, hearing loss, nosebleeds and sinus pain.   Eyes: Negative.  Negative for blurred vision, double vision and photophobia.  Respiratory: Negative for cough, hemoptysis, sputum production, shortness of breath and wheezing.   Cardiovascular: Negative.  Negative for chest pain, palpitations, orthopnea, leg swelling and PND.  Gastrointestinal: Positive for heartburn. Negative for abdominal pain, blood in stool, constipation, diarrhea, melena, nausea and vomiting.  Genitourinary: Negative.  Negative for dysuria, frequency, hematuria and urgency.  Musculoskeletal: Negative.  Negative for back pain, joint pain and myalgias.  Skin: Positive for itching and rash (urticaria).       Rash is better today.  Neurological: Negative.  Negative for dizziness, tingling, sensory change, speech change, focal weakness, weakness and headaches.  Endo/Heme/Allergies: Negative.  Does not bruise/bleed easily.  Psychiatric/Behavioral: Negative.  Negative for depression and memory loss. The patient is not nervous/anxious and does not have insomnia.   All other systems reviewed and are negative.  Performance status (ECOG): 1  Vitals Blood pressure 115/75, pulse (!) 102, temperature (!) 97.3 F (36.3 C), temperature source Tympanic, resp. rate 16, weight 163 lb 5.8 oz (74.1 kg).   Physical Exam  Constitutional: She is oriented to person,  place, and time. She appears well-developed and well-nourished. No distress.  HENT:  Head: Normocephalic and atraumatic.  Mouth/Throat: Oropharynx is clear and moist. No oropharyngeal exudate.  Shoulder length brown hair.  Mask.  Eyes: Pupils are equal, round, and reactive to light. Conjunctivae and EOM are normal. No scleral icterus.  Glasses.  Brown eyes.  Neck: Normal range of motion. Neck supple.  Cardiovascular: Normal rate, regular rhythm and normal heart sounds.  No murmur heard. Pulmonary/Chest: Effort normal and breath sounds normal. No respiratory distress. She has no wheezes.  Abdominal: Soft. Bowel sounds are normal. She exhibits no distension and no mass. There is no abdominal tenderness. There is no rebound and no guarding.  Musculoskeletal: Normal range of motion.        General: No tenderness or edema.  Lymphadenopathy:    She has no cervical adenopathy.    She has no axillary adenopathy.       Right: No supraclavicular adenopathy present.       Left: No supraclavicular adenopathy present.  Neurological: She is alert and oriented to person,  place, and time.  Skin: Skin is warm and dry. Rash noted. She is not diaphoretic. No erythema. No pallor.  Macular rash (photos below).  No dermatographia.  Psychiatric: She has a normal mood and affect. Her behavior is normal. Judgment and thought content normal.  Nursing note and vitals reviewed.       Left leg     Flank  Office Visit on 02/22/2019  Component Date Value Ref Range Status  . IgE (Immunoglobulin E), Serum 02/22/2019 4* 6 - 495 IU/mL Final   Comment: (NOTE) Performed At: [facility] [place], [place] [id number] Rush Farmer MD [id number]   . WBC 02/22/2019 12.7* 4.0 - 10.5 K/uL Final  . RBC 02/22/2019 4.67  3.87 - 5.11 MIL/uL Final  . Hemoglobin 02/22/2019 13.9  12.0 - 15.0 g/dL Final  . HCT 02/22/2019 40.8  36.0 - 46.0 % Final  . MCV 02/22/2019 87.4  80.0 - 100.0 fL Final  . MCH  02/22/2019 29.8  26.0 - 34.0 pg Final  . MCHC 02/22/2019 34.1  30.0 - 36.0 g/dL Final  . RDW 02/22/2019 12.1  11.5 - 15.5 % Final  . Platelets 02/22/2019 296  150 - 400 K/uL Final  . nRBC 02/22/2019 0.0  0.0 - 0.2 % Final  . Neutrophils Relative % 02/22/2019 80  % Final  . Neutro Abs 02/22/2019 10.3* 1.7 - 7.7 K/uL Final  . Lymphocytes Relative 02/22/2019 13  % Final  . Lymphs Abs 02/22/2019 1.6  0.7 - 4.0 K/uL Final  . Monocytes Relative 02/22/2019 5  % Final  . Monocytes Absolute 02/22/2019 0.6  0.1 - 1.0 K/uL Final  . Eosinophils Relative 02/22/2019 1  % Final  . Eosinophils Absolute 02/22/2019 0.2  0.0 - 0.5 K/uL Final  . Basophils Relative 02/22/2019 0  % Final  . Basophils Absolute 02/22/2019 0.0  0.0 - 0.1 K/uL Final  . Immature Granulocytes 02/22/2019 1  % Final  . Abs Immature Granulocytes 02/22/2019 0.08* 0.00 - 0.07 K/uL Final   Performed at [facility], [address] [place], [place] [address]  . Anti Nuclear Antibody (ANA) 02/22/2019 Negative  Negative Final   Comment: (NOTE) Performed At: [facility] [place], [place] [id number] Rush Farmer MD [id number]   . Tryptase 02/22/2019 8.5  2.2 - 13.2 ug/L Final   Comment: (NOTE) Performed At: [facility] [place], [place] [id number] Rush Farmer MD [id number]   . IgG (Immunoglobin G), Serum 02/22/2019 621  586 - 1,602 mg/dL Final  . IgA 02/22/2019 141  87 - 352 mg/dL Final  . IgM (Immunoglobulin M), Srm 02/22/2019 115  26 - 217 mg/dL Final  . Total Protein ELP 02/22/2019 6.2  6.0 - 8.5 g/dL Corrected  . Albumin SerPl Elph-Mcnc 02/22/2019 3.7  2.9 - 4.4 g/dL Corrected  . Alpha 1 02/22/2019 0.3  0.0 - 0.4 g/dL Corrected  . Alpha2 Glob SerPl Elph-Mcnc 02/22/2019 0.8  0.4 - 1.0 g/dL Corrected  . B-Globulin SerPl Elph-Mcnc 02/22/2019 0.8  0.7 - 1.3 g/dL Corrected  . Gamma Glob SerPl Elph-Mcnc 02/22/2019 0.5  0.4 - 1.8 g/dL Corrected  . M Protein SerPl  Elph-Mcnc 02/22/2019 Not Observed  Not Observed g/dL Corrected  . Globulin, Total 02/22/2019 2.5  2.2 - 3.9 g/dL Corrected  . Albumin/Glob SerPl 02/22/2019 1.5  0.7 - 1.7 Corrected  . IFE 1 02/22/2019 Comment   Corrected   Comment: (NOTE) The immunofixation pattern appears unremarkable. Evidence of monoclonal  protein is not apparent.   . Please Note 02/22/2019 Comment   Corrected   Comment: (NOTE) Protein electrophoresis scan will follow via computer, mail, or courier delivery. Performed At: [facility] [place], [place] [id number] Rush Farmer MD [id number]   . Path Review 02/22/2019 Peripheral blood smear is reviewed.   Final   Comment: Mild leukocytosis with neutrophilia and minimal left shift in maturation. No significant morphologic abnormalities of leukocytes. Unremarkable RBC morphology and indices. Normal platelet count and morphology. The cause of the patient's leukocytosis is not clear from morphologic evaluation. The differential diagnosis may include idiopathic causes, drug/toxin exposure, infection/inflammation, or other causes. Clinical correlation is recommended. Reviewed by Kathi Simpers, M.D. Performed at [facility], [address] [place], [place] [address]     Assessment:  Terri Tanner is a 58 y.o. female with chronic urticaria since 12/2018 and mild leukocytosis since 02/09/2019.  WBC has ranged between 11,100 and 13,600.  Differential has been predominantly neutrophils.  Etiology is felt reactive.  CRP has been elevated.  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  Etiology does not appear related to any medications, herbal products, foods, inset bites, infections, or NSAIDs.  She has no apparent autoimmune disease.  Symptomatically, she denies any fevers, sweats or weight loss.  Exam reveals no adenopathy or hepatosplenomegaly.  Rash is macular.  She has no dermatographia.  Plan: 1. Labs  today:  CBC with diff, ANA with reflex, SPEP, immunoglobulin levels, tryptase. 2. Peripheral smear for pathologic review. 3. Leukocytosis  Etiology appears reactive.  Discuss work-up.  4. Chronic urticaria  Symptoms have persisted for > 6 weeks.  Etiology unclear.  No apparent inciting events.  Doubt IgG or IgM paraprotein or mastocytosis.  Agree with dermatology evaluation. 5. RTC in 2 weeks for MD assessment and review of work-up.  I discussed the assessment and treatment plan with the patient.  The patient was provided an opportunity to ask questions and all were answered.  The patient agreed with the plan and demonstrated an understanding of the instructions.  The patient was advised to call back if the symptoms worsen or if the condition fails to improve as anticipated.   Melissa C. Mike Gip, MD, PhD    02/22/2019, 10:52 AM

## 2019-02-22 ENCOUNTER — Inpatient Hospital Stay: Payer: BLUE CROSS/BLUE SHIELD

## 2019-02-22 ENCOUNTER — Inpatient Hospital Stay: Payer: BLUE CROSS/BLUE SHIELD | Attending: Hematology and Oncology | Admitting: Hematology and Oncology

## 2019-02-22 ENCOUNTER — Encounter: Payer: Self-pay | Admitting: Hematology and Oncology

## 2019-02-22 ENCOUNTER — Telehealth: Payer: Self-pay

## 2019-02-22 VITALS — BP 115/75 | HR 102 | Temp 97.3°F | Resp 16 | Wt 163.4 lb

## 2019-02-22 DIAGNOSIS — D72829 Elevated white blood cell count, unspecified: Secondary | ICD-10-CM | POA: Diagnosis not present

## 2019-02-22 DIAGNOSIS — L508 Other urticaria: Secondary | ICD-10-CM | POA: Diagnosis not present

## 2019-02-22 DIAGNOSIS — L299 Pruritus, unspecified: Secondary | ICD-10-CM | POA: Insufficient documentation

## 2019-02-22 DIAGNOSIS — L509 Urticaria, unspecified: Secondary | ICD-10-CM | POA: Insufficient documentation

## 2019-02-22 DIAGNOSIS — E118 Type 2 diabetes mellitus with unspecified complications: Secondary | ICD-10-CM | POA: Diagnosis not present

## 2019-02-22 DIAGNOSIS — Z79899 Other long term (current) drug therapy: Secondary | ICD-10-CM | POA: Insufficient documentation

## 2019-02-22 LAB — CBC WITH DIFFERENTIAL/PLATELET
Abs Immature Granulocytes: 0.08 10*3/uL — ABNORMAL HIGH (ref 0.00–0.07)
Basophils Absolute: 0 10*3/uL (ref 0.0–0.1)
Basophils Relative: 0 %
Eosinophils Absolute: 0.2 10*3/uL (ref 0.0–0.5)
Eosinophils Relative: 1 %
HCT: 40.8 % (ref 36.0–46.0)
Hemoglobin: 13.9 g/dL (ref 12.0–15.0)
Immature Granulocytes: 1 %
Lymphocytes Relative: 13 %
Lymphs Abs: 1.6 10*3/uL (ref 0.7–4.0)
MCH: 29.8 pg (ref 26.0–34.0)
MCHC: 34.1 g/dL (ref 30.0–36.0)
MCV: 87.4 fL (ref 80.0–100.0)
Monocytes Absolute: 0.6 10*3/uL (ref 0.1–1.0)
Monocytes Relative: 5 %
Neutro Abs: 10.3 10*3/uL — ABNORMAL HIGH (ref 1.7–7.7)
Neutrophils Relative %: 80 %
Platelets: 296 10*3/uL (ref 150–400)
RBC: 4.67 MIL/uL (ref 3.87–5.11)
RDW: 12.1 % (ref 11.5–15.5)
WBC: 12.7 10*3/uL — ABNORMAL HIGH (ref 4.0–10.5)
nRBC: 0 % (ref 0.0–0.2)

## 2019-02-22 LAB — PATHOLOGIST SMEAR REVIEW

## 2019-02-22 NOTE — Telephone Encounter (Signed)
Copied from [place] [id number]. Topic: General - Other >> Feb 22, 2019  8:54 AM Leward Quan A wrote: Reason for CRM: Good morning the [facility] is requesting all patients recent labs they tried to access in the chart but states that patient record does not have care everywhere access so they are not able to see her labs asking for them to be faxed over please. Patient have a 10.30 am appointment today. Please fax labs to fax# [phone]  I called the [facility] and informed them that we are on Epic so they should full acces to any information that they need and they said ok.

## 2019-02-22 NOTE — Progress Notes (Signed)
Pt here as new referral from Dr. Ancil Boozer for Leukocytosis. Denies any concerns.

## 2019-02-23 LAB — MULTIPLE MYELOMA PANEL, SERUM
Albumin SerPl Elph-Mcnc: 3.7 g/dL (ref 2.9–4.4)
Albumin/Glob SerPl: 1.5 (ref 0.7–1.7)
Alpha 1: 0.3 g/dL (ref 0.0–0.4)
Alpha2 Glob SerPl Elph-Mcnc: 0.8 g/dL (ref 0.4–1.0)
B-Globulin SerPl Elph-Mcnc: 0.8 g/dL (ref 0.7–1.3)
Gamma Glob SerPl Elph-Mcnc: 0.5 g/dL (ref 0.4–1.8)
Globulin, Total: 2.5 g/dL (ref 2.2–3.9)
IgA: 141 mg/dL (ref 87–352)
IgG (Immunoglobin G), Serum: 621 mg/dL (ref 586–1602)
IgM (Immunoglobulin M), Srm: 115 mg/dL (ref 26–217)
Total Protein ELP: 6.2 g/dL (ref 6.0–8.5)

## 2019-02-23 LAB — ANA W/REFLEX: Anti Nuclear Antibody (ANA): NEGATIVE

## 2019-02-24 LAB — IGE: IgE (Immunoglobulin E), Serum: 4 IU/mL — ABNORMAL LOW (ref 6–495)

## 2019-02-24 LAB — TRYPTASE: Tryptase: 8.5 ug/L (ref 2.2–13.2)

## 2019-03-03 ENCOUNTER — Encounter: Payer: Self-pay | Admitting: Family Medicine

## 2019-03-07 NOTE — Progress Notes (Signed)
[facility]  [address] [place], [place] [address] Phone: [phone]  Fax: [phone]   Clinic Day:  03/08/2019  Referring physician: Steele Sizer, MD  Chief Complaint: Terri Tanner is a 58 y.o. female with chronic urticaria and leukocytosis who is seen for review of work-up and discussion regarding direction of therapy.  HPI:  The patient was last seen in the medical oncology clinic on 02/22/2019 for new patient assessment.  She noted urticaria beginning around 12/24/2018.  She described intermittent swelling of her lips.  There were no precipitating events, drugs, food, insect bites or infections.  Symptomatically, she denied any fevers, sweats or weight loss.  Exam revealed no adenopathy or hepatosplenomegaly.  Rash was macular.  She had no dermatographia.  She underwent a work-up.  CBC revealed a hematocrit of 40.8, hemoglobin 13.9, MCV 87.4, platelets 296,000, white count 12,700 with an ANC of 10,300.  Differential included 80% segs, 13% lymphs, 5% monocytes, and 1% eosinophils.  ANA was negative.  Tryptase was 8.5 (2.2-13.2).  SPEP revealed no monoclonal protein.  IgG was 621, IgA 141, IgM 115, and IgE 4 (low).  Peripheral smear revealed mild leukocytosis with neutrophilia and minimal left shifted maturation.  There is no significant morphologic abnormalities of leukocytes.  There was unremarkable RBC morphology and indices.  There was normal platelet count and morphology.    During the interim, she notes that she has been itchy with a rash present. She has discontinued Lipitor starting on 03/04/2019; she began taking this in 10/2018. She has an appointment with an allergist tomorrow.    Past Medical History:  Diagnosis Date  . Allergy   . Diabetes mellitus type 2 in obese ([place]) 02/06/2018    Past Surgical History:  Procedure Laterality Date  . CERVICAL POLYPECTOMY    . CESAREAN SECTION    . COLONOSCOPY WITH PROPOFOL N/A 03/03/2018   Procedure: COLONOSCOPY WITH PROPOFOL;  Surgeon: Jonathon Bellows, MD;  Location: [facility] ENDOSCOPY;  Service: Gastroenterology;  Laterality: N/A;  . TRANSANAL EXCISION OF RECTAL MASS N/A 04/14/2018   Procedure: TRANSANAL EXCISION OF RECTAL POLYP;  Surgeon: Jules Husbands, MD;  Location: [facility] ORS;  Service: General;  Laterality: N/A;  . WISDOM TOOTH EXTRACTION    . WISDOM TOOTH EXTRACTION      Family History  Problem Relation Age of Onset  . Lung cancer Mother        smoked for many years  . COPD Mother   . Emphysema Mother   . Esophageal cancer Father        smoker  . Suicidality Father   . Cancer Sister   . Stroke Brother   . Alcohol abuse Brother   . Other Brother        huge smoker  . Diabetes Sister   . Breast cancer Neg Hx     Social History:  reports that she has never smoked. She has never used smokeless tobacco. She reports current alcohol use. She reports that she does not use drugs. She denies any exposure to radiation or toxins.  She lives with her daughter and granddaughter in [place].  She moved in July.  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 The patient is accompanied by her husband via video conference today.  Allergies: No Known Allergies  Current Medications: Current Outpatient Medications  Medication Sig Dispense Refill  . Multiple Vitamins-Minerals (MULTIVITAMIN PO) Take 1 tablet by mouth daily.    Marland Kitchen atorvastatin (LIPITOR) 40 MG tablet Take 1 tablet (  40 mg total) by mouth daily. (Patient not taking: Reported on 03/08/2019) 90 tablet 1  . Cholecalciferol (VITAMIN D) 2000 units CAPS Take 2,000 Units by mouth daily.     Marland Kitchen EPINEPHrine 0.3 mg/0.3 mL IJ SOAJ injection Inject 0.3 mLs (0.3 mg total) into the muscle as needed for anaphylaxis. (Patient not taking: Reported on 03/08/2019) 2 each 0  . loratadine (CLARITIN) 10 MG tablet Take 1 tablet (10 mg total) by mouth 2 (two) times daily. (Patient not taking: Reported on 02/22/2019) 60 tablet 0   No current facility-administered  medications for this visit.     Review of Systems  Constitutional: Negative.  Negative for chills, diaphoresis, fever, malaise/fatigue and weight loss (intentional).       Feels "great except for itching".  HENT: Positive for sore throat (when "hives in throat"). Negative for congestion, ear discharge, ear pain, hearing loss, nosebleeds and sinus pain.   Eyes: Negative.  Negative for blurred vision, double vision and photophobia.  Respiratory: Positive for cough. Negative for hemoptysis, sputum production, shortness of breath and wheezing.   Cardiovascular: Negative.  Negative for chest pain, palpitations, orthopnea, leg swelling and PND.  Gastrointestinal: Positive for heartburn. Negative for abdominal pain, blood in stool, constipation, diarrhea, melena, nausea and vomiting.  Genitourinary: Negative.  Negative for dysuria, frequency, hematuria and urgency.  Musculoskeletal: Negative.  Negative for back pain, joint pain and myalgias.  Skin: Positive for itching and rash (urticaria).       Rash is better today.  Neurological: Negative.  Negative for dizziness, tingling, sensory change, speech change, focal weakness, weakness and headaches.  Endo/Heme/Allergies: Negative.  Does not bruise/bleed easily.  Psychiatric/Behavioral: Negative.  Negative for depression and memory loss. The patient is not nervous/anxious and does not have insomnia.   All other systems reviewed and are negative.  Performance status (ECOG): 1 - Symptomatic but completely ambulatory  Vitals Blood pressure 123/62, pulse (!) 120, temperature (!) 96.3 F (35.7 C), temperature source Tympanic, resp. rate 18, height 5\' 2"  (1.575 m), weight 162 lb 7.7 oz (73.7 kg), SpO2 100 %.   Physical Exam  Constitutional: She is oriented to person, place, and time. She appears well-developed and well-nourished. No distress.  HENT:  Head: Normocephalic and atraumatic.  Shoulder length brown hair.  Mask.  Eyes: Conjunctivae and EOM are  normal. No scleral icterus.  Glasses.  Brown eyes.  Cardiovascular: Regular rhythm.  Neurological: She is alert and oriented to person, place, and time.  Skin: Skin is warm and dry. Rash noted. She is not diaphoretic. No erythema. No pallor.  Macular rash.  Psychiatric: She has a normal mood and affect. Her behavior is normal. Judgment and thought content normal.  Nursing note and vitals reviewed.   No visits with results within 3 Day(s) from this visit.  Latest known visit with results is:  Office Visit on 02/22/2019  Component Date Value Ref Range Status  . IgE (Immunoglobulin E), Serum 02/22/2019 4* 6 - 495 IU/mL Final   Comment: (NOTE) Performed At: [facility] [place], [place] [id number] Rush Farmer MD [id number]   . WBC 02/22/2019 12.7* 4.0 - 10.5 K/uL Final  . RBC 02/22/2019 4.67  3.87 - 5.11 MIL/uL Final  . Hemoglobin 02/22/2019 13.9  12.0 - 15.0 g/dL Final  . HCT 02/22/2019 40.8  36.0 - 46.0 % Final  . MCV 02/22/2019 87.4  80.0 - 100.0 fL Final  . MCH 02/22/2019 29.8  26.0 - 34.0 pg Final  . MCHC  02/22/2019 34.1  30.0 - 36.0 g/dL Final  . RDW 02/22/2019 12.1  11.5 - 15.5 % Final  . Platelets 02/22/2019 296  150 - 400 K/uL Final  . nRBC 02/22/2019 0.0  0.0 - 0.2 % Final  . Neutrophils Relative % 02/22/2019 80  % Final  . Neutro Abs 02/22/2019 10.3* 1.7 - 7.7 K/uL Final  . Lymphocytes Relative 02/22/2019 13  % Final  . Lymphs Abs 02/22/2019 1.6  0.7 - 4.0 K/uL Final  . Monocytes Relative 02/22/2019 5  % Final  . Monocytes Absolute 02/22/2019 0.6  0.1 - 1.0 K/uL Final  . Eosinophils Relative 02/22/2019 1  % Final  . Eosinophils Absolute 02/22/2019 0.2  0.0 - 0.5 K/uL Final  . Basophils Relative 02/22/2019 0  % Final  . Basophils Absolute 02/22/2019 0.0  0.0 - 0.1 K/uL Final  . Immature Granulocytes 02/22/2019 1  % Final  . Abs Immature Granulocytes 02/22/2019 0.08* 0.00 - 0.07 K/uL Final   Performed at [facility], [address] [place], [place] [address]  . Anti Nuclear Antibody (ANA) 02/22/2019 Negative  Negative Final   Comment: (NOTE) Performed At: [facility] [place], [place] [id number] Rush Farmer MD [id number]   . Tryptase 02/22/2019 8.5  2.2 - 13.2 ug/L Final   Comment: (NOTE) Performed At: [facility] [place], [place] [id number] Rush Farmer MD [id number]   . IgG (Immunoglobin G), Serum 02/22/2019 621  586 - 1,602 mg/dL Final  . IgA 02/22/2019 141  87 - 352 mg/dL Final  . IgM (Immunoglobulin M), Srm 02/22/2019 115  26 - 217 mg/dL Final  . Total Protein ELP 02/22/2019 6.2  6.0 - 8.5 g/dL Corrected  . Albumin SerPl Elph-Mcnc 02/22/2019 3.7  2.9 - 4.4 g/dL Corrected  . Alpha 1 02/22/2019 0.3  0.0 - 0.4 g/dL Corrected  . Alpha2 Glob SerPl Elph-Mcnc 02/22/2019 0.8  0.4 - 1.0 g/dL Corrected  . B-Globulin SerPl Elph-Mcnc 02/22/2019 0.8  0.7 - 1.3 g/dL Corrected  . Gamma Glob SerPl Elph-Mcnc 02/22/2019 0.5  0.4 - 1.8 g/dL Corrected  . M Protein SerPl Elph-Mcnc 02/22/2019 Not Observed  Not Observed g/dL Corrected  . Globulin, Total 02/22/2019 2.5  2.2 - 3.9 g/dL Corrected  . Albumin/Glob SerPl 02/22/2019 1.5  0.7 - 1.7 Corrected  . IFE 1 02/22/2019 Comment   Corrected   Comment: (NOTE) The immunofixation pattern appears unremarkable. Evidence of monoclonal protein is not apparent.   . Please Note 02/22/2019 Comment   Corrected   Comment: (NOTE) Protein electrophoresis scan will follow via computer, mail, or courier delivery. Performed At: [facility] [place], [place] [id number] Rush Farmer MD [id number]   . Path Review 02/22/2019 Peripheral blood smear is reviewed.   Final   Comment: Mild leukocytosis with neutrophilia and minimal left shift in maturation. No significant morphologic abnormalities of leukocytes. Unremarkable RBC morphology and indices. Normal platelet count and morphology. The cause of  the patient's leukocytosis is not clear from morphologic evaluation. The differential diagnosis may include idiopathic causes, drug/toxin exposure, infection/inflammation, or other causes. Clinical correlation is recommended. Reviewed by Kathi Simpers, M.D. Performed at [facility], [address] [place], [place] [address]     Assessment:  CHAREL TANCREDI is a 58 y.o. female with chronic urticaria since 12/2018 and mild leukocytosis since 02/09/2019.  WBC has ranged between 11,100 and 13,600.  Differential has been predominantly neutrophils.  Etiology is felt reactive.  CRP has been elevated.  Work-up on 02/22/2019 revealed a hematocrit of 40.8, hemoglobin 13.9, MCV 87.4, platelets 296,000, white count 12,700 with an ANC of 10,300.  Differential included 80% segs, 13% lymphs, 5% monocytes, and 1% eosinophils.  ANA was negative.  Tryptase was 8.5 (2.2-13.2).  SPEP revealed no monoclonal protein.  IgG was 621, IgA 141, IgM 115, and IgE 4 (low).  Peripheral smear revealed mild leukocytosis with neutrophilia and minimal left shifted maturation.  There is no significant morphologic abnormalities of leukocytes.  There was unremarkable RBC morphology and indices.  There was normal platelet count and morphology.    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  Etiology does not appear related to any medications, herbal products, foods, inset bites, infections, or NSAIDs.  She has no apparent autoimmune disease.  Symptomatically, she feels well except for her rash.  Plan: 1.   Leukocytosis             Review work-up.  Peripheral smear unremarkable.  Etiology appears reactive. 2.   Chronic urticaria             Symptoms have persisted for > 6 weeks.             Etiology remains unclear.    She has recently stopped Lipitor.  There are no apparent inciting events.    There is no evidence of a myeloproliferative disorder, paraprotein or mastocytosis.              Follow-up with allergy as scheduled tomorrow. 3.   RTC prn.  I discussed the assessment and treatment plan with the patient.  The patient was provided an opportunity to ask questions and all were answered.  The patient agreed with the plan and demonstrated an understanding of the instructions.  The patient was advised to call back if the symptoms worsen or if the condition fails to improve as anticipated.  I provided 15 minutes of face-to-face time during this this encounter and > 50% was spent counseling as documented under my assessment and plan.    Lequita Asal, MD, PhD    03/08/2019, 9:33 AM  I, Jacqualyn Posey, am acting as Education administrator for [employer]. Mike Gip, MD, PhD.  I, Melissa C. Mike Gip, MD, have reviewed the above documentation for accuracy and completeness, and I agree with the above.

## 2019-03-08 ENCOUNTER — Inpatient Hospital Stay (HOSPITAL_BASED_OUTPATIENT_CLINIC_OR_DEPARTMENT_OTHER): Payer: BLUE CROSS/BLUE SHIELD | Admitting: Hematology and Oncology

## 2019-03-08 ENCOUNTER — Encounter: Payer: Self-pay | Admitting: Hematology and Oncology

## 2019-03-08 ENCOUNTER — Other Ambulatory Visit: Payer: Self-pay

## 2019-03-08 VITALS — BP 123/62 | HR 100 | Temp 96.3°F | Resp 18 | Ht 62.0 in | Wt 162.5 lb

## 2019-03-08 DIAGNOSIS — D72829 Elevated white blood cell count, unspecified: Secondary | ICD-10-CM

## 2019-03-08 DIAGNOSIS — L509 Urticaria, unspecified: Secondary | ICD-10-CM | POA: Diagnosis not present

## 2019-03-08 NOTE — Progress Notes (Signed)
Patient c/o having hives all over and was told to stop taking Lipitor. The patient is coughing during the visit, no fever. The patient hr elevated 120

## 2019-03-10 ENCOUNTER — Other Ambulatory Visit: Payer: Self-pay

## 2019-03-10 ENCOUNTER — Ambulatory Visit
Admission: RE | Admit: 2019-03-10 | Discharge: 2019-03-10 | Disposition: A | Discharge status: Home / Self Care | Payer: BLUE CROSS/BLUE SHIELD | Source: Ambulatory Visit | Attending: Family Medicine | Admitting: Family Medicine

## 2019-03-10 DIAGNOSIS — Z1231 Encounter for screening mammogram for malignant neoplasm of breast: Secondary | ICD-10-CM | POA: Diagnosis not present

## 2019-03-13 ENCOUNTER — Other Ambulatory Visit: Payer: Self-pay | Admitting: Family Medicine

## 2019-03-13 DIAGNOSIS — R928 Other abnormal and inconclusive findings on diagnostic imaging of breast: Secondary | ICD-10-CM

## 2019-03-13 DIAGNOSIS — N6310 Unspecified lump in the right breast, unspecified quadrant: Secondary | ICD-10-CM

## 2019-03-13 DIAGNOSIS — N6320 Unspecified lump in the left breast, unspecified quadrant: Secondary | ICD-10-CM

## 2019-03-15 ENCOUNTER — Telehealth: Payer: Self-pay

## 2019-03-15 NOTE — Telephone Encounter (Signed)
-----   Message from Steele Sizer, MD sent at 03/15/2019  7:35 AM EDT ----- Did she go see the allergist? Can you find a note for me? I know she had originally cancelled but was supposed to re-schedule it ----- Message ----- From: Lequita Asal, MD Sent: 03/15/2019   1:08 AM EDT To: Steele Sizer, MD

## 2019-03-15 NOTE — Telephone Encounter (Signed)
Called to inquire about Allergies visit I seen in her past notes to Dr. Ancil Boozer she scheduled a visit with an allergist on Sep. 17.

## 2019-03-16 ENCOUNTER — Encounter: Payer: Self-pay | Admitting: Family Medicine

## 2019-03-24 ENCOUNTER — Ambulatory Visit
Admission: RE | Admit: 2019-03-24 | Discharge: 2019-03-24 | Disposition: A | Discharge status: Home / Self Care | Payer: BLUE CROSS/BLUE SHIELD | Source: Ambulatory Visit | Attending: Family Medicine | Admitting: Family Medicine

## 2019-03-24 DIAGNOSIS — N6320 Unspecified lump in the left breast, unspecified quadrant: Secondary | ICD-10-CM

## 2019-03-24 DIAGNOSIS — R928 Other abnormal and inconclusive findings on diagnostic imaging of breast: Secondary | ICD-10-CM | POA: Diagnosis present

## 2019-03-24 DIAGNOSIS — N6310 Unspecified lump in the right breast, unspecified quadrant: Secondary | ICD-10-CM

## 2019-03-27 ENCOUNTER — Other Ambulatory Visit: Payer: Self-pay | Admitting: Family Medicine

## 2019-03-27 DIAGNOSIS — N6310 Unspecified lump in the right breast, unspecified quadrant: Secondary | ICD-10-CM

## 2019-03-27 DIAGNOSIS — R928 Other abnormal and inconclusive findings on diagnostic imaging of breast: Secondary | ICD-10-CM

## 2019-03-28 ENCOUNTER — Telehealth: Payer: Self-pay

## 2019-03-28 NOTE — Telephone Encounter (Signed)
Copied from [place] [id number]. Topic: General - Other >> Mar 28, 2019  8:53 AM Leward Quan A wrote: Reason for CRM: Patient called to say that she have been waiting on a phone call to schedule a biopsy on her right breast. States that she have yet to get a call about this biopsy. Please call patient at Ph# [phone]   I called to inform this patient that [facility] will be setting up her biopsy and she stated that they actually called her after she placed this message. She is scheduled for next Monday at 8am.

## 2019-03-29 ENCOUNTER — Encounter: Payer: Self-pay | Admitting: Family Medicine

## 2019-03-29 ENCOUNTER — Other Ambulatory Visit: Payer: Self-pay | Admitting: Family Medicine

## 2019-03-29 DIAGNOSIS — N63 Unspecified lump in unspecified breast: Secondary | ICD-10-CM

## 2019-03-29 DIAGNOSIS — R928 Other abnormal and inconclusive findings on diagnostic imaging of breast: Secondary | ICD-10-CM

## 2019-03-30 ENCOUNTER — Other Ambulatory Visit: Payer: Self-pay | Admitting: Family Medicine

## 2019-03-30 NOTE — Telephone Encounter (Signed)
Copied from [place] [phone]. Topic: General - Other >> Mar 29, 2019  3:19 PM Pauline Good wrote: Reason for CRM: need procedure code for Biopsy rt breast that will be done 10.12.20. also the left breast as well.

## 2019-04-03 ENCOUNTER — Ambulatory Visit
Admission: RE | Admit: 2019-04-03 | Discharge: 2019-04-03 | Disposition: A | Discharge status: Home / Self Care | Payer: BLUE CROSS/BLUE SHIELD | Source: Ambulatory Visit | Attending: Family Medicine | Admitting: Family Medicine

## 2019-04-03 DIAGNOSIS — R928 Other abnormal and inconclusive findings on diagnostic imaging of breast: Secondary | ICD-10-CM

## 2019-04-03 DIAGNOSIS — N6310 Unspecified lump in the right breast, unspecified quadrant: Secondary | ICD-10-CM | POA: Insufficient documentation

## 2019-04-05 ENCOUNTER — Encounter: Payer: Self-pay | Admitting: *Deleted

## 2019-04-05 NOTE — Progress Notes (Signed)
Talked to patient today to establish navigation services.  Patient is newly diagnosed with invasive mammary carcinoma.  States she would like to see Dr. Bary Castilla and has seen Dr. Mike Gip for hematology just about a month ago.  She would like to see if surgery is even possible before October 29th, since her husband will be having eye surgery.  I have scheduled her to see Dr. Bary Castilla on tomorrow at 5:00.  She is to arrive by 4:30, take a photo ID, wear a mask and have her insurance card.  She is also scheduled to see Dr. Mike Gip on 04/13/19 @ 3:00

## 2019-04-07 ENCOUNTER — Encounter: Payer: Self-pay | Admitting: *Deleted

## 2019-04-07 HISTORY — PX: BREAST BIOPSY: SHX20

## 2019-04-07 LAB — SURGICAL PATHOLOGY

## 2019-04-10 NOTE — Progress Notes (Signed)
Talked to patient today.  She had multiple questions regarding to keep oncology consult at this time or wait until after surgery.  Discussed with her family history of a sister with ovarian cancer, I felt it would be beneficial to see Dr. Mike Gip prior to surgery.  She is to let me know her plans.

## 2019-04-11 NOTE — Progress Notes (Signed)
[facility]  [address], [address] [place], [place] [address] Phone: [phone]  Fax: [phone]   Clinic Day:  04/13/2019  Referring physician: Steele Sizer, MD  Chief Complaint: Terri Tanner is a 58 y.o. female with chronic urticaria andleukocytosis who is seen for reassessment after interval diagnosis of right breast cancer.   HPI: The patient was last seen in the medical oncology clinic on 03/08/2019. At that time, she felt well except for her rash. Work up was reviewed. She was scheduled to follow-up with allergy.  Bilateral screening mammogram on 03/10/2019 revealed a possible mass in the right and left breast.   Bilateral diagnostic mammogram on 03/24/2019 revealed a suspicious 1.1 cm mass in the right breast at the 11 o'clock position and a 0.5 cm oval circumscribed mass in the upper outer left breast. Targeted ultrasound of the right breast revealed an irregular 1.3 x 0.8 x 1.1 cm hypoechoic mass at 11 o'clock 4 cm from the nipple.  Lymph nodes were unremarkable.  Targeted ultrasound of the left breast revealed a cluster of microcysts at 2 o'clock 6 cm from nipple measuring 0.7 x 0.2 x 0.7 cm.  Ultrasound-guided biopsy of the right breast was recommended.    Ultrasound guided biopsy of the right breast on 04/03/2019 revealed a grade I invasive mammary carcinoma, no special type.  Biopsy was 7 mm.  No DCIS or lymphovascular invasion was identified.  Tumor was ER + (> 90%) and PR + (40%), and Her2/neu negative (0).  During the interim, she has felt nervous about the visit. Her weight is down 8 pounds since 03/08/2019.  She is taking vitamins. She notes losing 8-10 pounds since 01/2019. She states that she had allergry testing and is not allergic to anything. She received a cortisone shot. Her rash came back 3 days ago. The rash comes and goes with medication. She has a cough in the mornings. She has no heartburn. Heartburn comes and goes.  She was  seen by Dr Terri Piedra on 04/06/2019. He discussed a lumpectomy with radiation or mastectomy, but he did not recommend anything. I dicussed both procedures.  I discussed likely plan for endocrine therapy alone with tamoxifen or aromatase inhibitor. Patient has not had a bone density survey. We discussed genetic testing. She does not want radiation or chemotherapy. She is interested in having a mastectomy.    She began menses around age 74. She no longer has menstrual cycles. She has one daughter. She breast feed for 8 weeks.   Her father had throat cancer related to smoking. Mother had lung cancer related to smoking. Sister had ovarian and endometrial cancer in her 85s. Her other sisters had genetic testing; BRCA1 testing was negative. Her maternal aunt had breast cancer and her two daughters had brain tumors.    Past Medical History:  Diagnosis Date  . Allergy   . Diabetes mellitus type 2 in obese ([place]) 02/06/2018    Past Surgical History:  Procedure Laterality Date  . CERVICAL POLYPECTOMY    . CESAREAN SECTION    . COLONOSCOPY WITH PROPOFOL N/A 03/03/2018   Procedure: COLONOSCOPY WITH PROPOFOL;  Surgeon: Jonathon Bellows, MD;  Location: [facility] ENDOSCOPY;  Service: Gastroenterology;  Laterality: N/A;  . TRANSANAL EXCISION OF RECTAL MASS N/A 04/14/2018   Procedure: TRANSANAL EXCISION OF RECTAL POLYP;  Surgeon: Jules Husbands, MD;  Location: [facility] ORS;  Service: General;  Laterality: N/A;  . WISDOM TOOTH EXTRACTION    . WISDOM TOOTH EXTRACTION  Family History  Problem Relation Age of Onset  . Lung cancer Mother        smoked for many years  . COPD Mother   . Emphysema Mother   . Esophageal cancer Father        smoker  . Suicidality Father   . Cancer Sister   . Stroke Brother   . Alcohol abuse Brother   . Other Brother        huge smoker  . Diabetes Sister   . Breast cancer Neg Hx     Social History:  reports that she has never smoked. She has never used smokeless tobacco. She reports  current alcohol use. She reports that she does not use drugs. She denies any exposure to radiation or toxins. She lives with her daughter and granddaughter in [place]. She moved in July. 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 The patient is alone today.  Allergies: No Known Allergies  Current Medications: Current Outpatient Medications  Medication Sig Dispense Refill  . famotidine (PEPCID) 40 MG tablet     . hydrOXYzine (ATARAX/VISTARIL) 25 MG tablet Take 25 mg by mouth 3 (three) times daily.    Marland Kitchen levocetirizine (XYZAL) 5 MG tablet     . Multiple Vitamins-Minerals (MULTIVITAMIN PO) Take 1 tablet by mouth daily.    . Cholecalciferol (VITAMIN D) 2000 units CAPS Take 2,000 Units by mouth daily.     Marland Kitchen EPINEPHrine 0.3 mg/0.3 mL IJ SOAJ injection Inject 0.3 mLs (0.3 mg total) into the muscle as needed for anaphylaxis. (Patient not taking: Reported on 03/08/2019) 2 each 0  . loratadine (CLARITIN) 10 MG tablet Take 1 tablet (10 mg total) by mouth 2 (two) times daily. (Patient not taking: Reported on 02/22/2019) 60 tablet 0   No current facility-administered medications for this visit.     Review of Systems  Constitutional: Positive for weight loss (8 pounds). Negative for chills, diaphoresis, fever and malaise/fatigue.  HENT: Negative.  Negative for congestion, ear discharge, ear pain, hearing loss, nosebleeds, sinus pain and sore throat.   Eyes: Negative.  Negative for blurred vision, double vision and photophobia.  Respiratory: Positive for cough (in the mornings). Negative for hemoptysis, sputum production, shortness of breath and wheezing.   Cardiovascular: Negative.  Negative for chest pain, palpitations, orthopnea, leg swelling and PND.  Gastrointestinal: Negative.  Negative for abdominal pain, blood in stool, constipation, diarrhea, heartburn (on and off, on pepcide), melena, nausea and vomiting.  Genitourinary: Negative.  Negative for dysuria, frequency, hematuria and urgency.   Musculoskeletal: Negative.  Negative for back pain, joint pain, myalgias and neck pain.  Skin: Negative for itching and rash.       No rash today.  Neurological: Negative.  Negative for dizziness, tingling, sensory change, speech change, focal weakness, weakness and headaches.  Endo/Heme/Allergies: Negative.  Does not bruise/bleed easily.  Psychiatric/Behavioral: Negative for depression and memory loss. The patient is nervous/anxious. The patient does not have insomnia.   All other systems reviewed and are negative.  Performance status (ECOG): 1  Vitals Blood pressure 109/66, pulse 86, temperature (!) 97 F (36.1 C), temperature source Tympanic, resp. rate 18, height '5\' 2"'$  (1.575 m), weight 154 lb 14 oz (70.2 kg), SpO2 100 %.  Physical Exam  Constitutional: She is oriented to person, place, and time. She appears well-developed and well-nourished. No distress.  HENT:  Head: Normocephalic and atraumatic.  Mouth/Throat: Oropharynx is clear and moist. No oropharyngeal exudate.  Shoulder length brown hair.  Mask.  Eyes: Pupils are  equal, round, and reactive to light. Conjunctivae and EOM are normal. No scleral icterus.  Glasses.  Brown eyes.  Neck: Normal range of motion. Neck supple. No JVD present.  Cardiovascular: Normal rate, regular rhythm and normal heart sounds.  No murmur heard. Pulmonary/Chest: Effort normal and breath sounds normal. No respiratory distress. She has no wheezes. She has no rales. She exhibits no tenderness. Right breast exhibits tenderness (slight bruising near biopsy site).  Abdominal: Soft. Bowel sounds are normal. She exhibits no distension and no mass. There is no abdominal tenderness. There is no rebound and no guarding.  Musculoskeletal: Normal range of motion.        General: No tenderness or edema.  Lymphadenopathy:       Head (right side): No preauricular and no posterior auricular adenopathy present.       Head (left side): No preauricular, no posterior  auricular and no occipital adenopathy present.    She has no cervical adenopathy.    She has no axillary adenopathy.       Right: No inguinal adenopathy present.       Left: No inguinal and no supraclavicular adenopathy present.  Neurological: She is alert and oriented to person, place, and time.  Skin: Skin is warm and dry. No rash noted. She is not diaphoretic. No erythema. No pallor.  Psychiatric: She has a normal mood and affect. Her behavior is normal. Judgment and thought content normal.  Nursing note and vitals reviewed.    No visits with results within 3 Day(s) from this visit.  Latest known visit with results is:  Hospital Outpatient Visit on 04/03/2019  Component Date Value Ref Range Status  . SURGICAL PATHOLOGY 04/03/2019    Final-Edited                   Value:SURGICAL PATHOLOGY THIS IS AN ADDENDUM REPORT CASE: [id number] PATIENT: Terri Tanner Surgical Pathology Report Addendum  Reason for Addendum #1:  Breast Biomarker Results Reason for Addendum #2:  Breast Biomarker Results  Specimen Submitted: A. Breast, right  Clinical History: Coil-shaped biopsy marker clip within biopsy site UOQ Right breast following U/S biopsy  DIAGNOSIS: A. RIGHT BREAST, ULTRASOUND-GUIDED NEEDLE CORE BIOPSY: - INVASIVE MAMMARY CARCINOMA, NO SPECIAL TYPE.  Size of invasive carcinoma: 6 mm in this sample Histologic grade of invasive carcinoma: Grade 1                      Glandular/tubular differentiation score: 2                      Nuclear pleomorphism score: 2                      Mitotic rate score: 1                      Total score: 5 Ductal carcinoma in situ: Not identified Lymphovascular invasion: Not identified  ER/PR/HER2: Immunohistochemistry will be performed on block A1, with refle                         x to [place] for HER2 2+. The results will be reported in an addendum.  Comment: The definitive grade will be assigned on the excisional specimen. These findings  were communicated to Electa Sniff, RN, via [facility] secure chat on 04/04/19.  GROSS DESCRIPTION: A. Labeled: Ultrasound-guided right breast core biopsy at the 11 o'clock  position and 4 cm from nipple Received: In formalin Time/date in fixative: Tissue procedure time 8:42 AM, tissue put in formalin time 8:42 AM 04/03/2019 Cold ischemic time: Less than 1 minute Total fixation time: 11 hours Core pieces: 4 Size: Ranging from 0.8-1.2 cm in length and 0.1 cm in diameter Description: Fibrofatty soft tissue cords Ink color: Blue Entirely submitted in 1 cassette.  Final Diagnosis performed by Betsy Pries, MD.   Electronically signed 04/04/2019 12:01:08PM The electronic signature indicates that the named Attending Pathologist has evaluated the specimen Technical component performed at [facility], [place], [place], [place] [address] Lab: [phone] Dir: Rush Farmer, MD, MMM  Professional component performed at [facility], [facility], [place], [place], [place] [address] Lab: [phone] Dir: Dellia Nims. Reuel Derby, MD  ADDENDUM: This addendum is an attempt to send the report into the electronic medical record.  Breast biomarker results will follow in a separate addendum.  Addendum #1 performed by Betsy Pries, MD.   Electronically signed 04/05/2019 12:27:24PM The electronic signature indicates that the named Attending Pathologist has evaluated the specimen Technical component performed at [facility], [address], [place], [place] [address] Lab: [phone] Dir: Rush Farmer, MD, MMM  Professional component performed at [facility], [facility], [place], [place], [place] [address] Lab: [phone] Dir: Dellia Nims. Rubinas, MD  ADDENDUM:  BREAST BIOMARKER TESTS Estrogen Receptor (ER) Status: POSITIVE                                               Percentage of cells with nuclear positivity: > 90%                       Average intensity of staining: Strong  Progesterone Receptor (PgR) Status: POSITIVE                      Percentage of cells with nuclear positivity: 40%                      Average intensity of staining: Moderate  HER2 (by immunohistochemistry): NEGATIVE (Score 0)  Cold Ischemia and Fixation Times: Meet requirements specified in latest version of the ASCO/CAP guidelines Testing Performed on Block Number(s): A1  METHODS Fixative: Formalin Estrogen Receptor:  FDA cleared ([place]) Primary Antibody:  SP1 Progesterone Receptor: FDA cleared ([place]) Primary Antibody: 1E2 HER2 (by IHC): FDA cleared ([place]) Primary Antibody: 4B5 ([facility]) Immunohistochemistry controls worked appropriately. Slides were prepared by [facility], [place], [place], and interpreted by Dr. Betsy Pries.  This test was developed and its performance characteristics determined by [facility]. It ha                         s not been cleared or approved by the [place] Food and Drug Administration. The FDA does not require this test to go through premarket FDA review. This test is used for clinical purposes. It should not be regarded as investigational or for research. This laboratory is certified under the Clinical Laboratory Improvement Amendments (CLIA) as qualified to perform high complexity clinical laboratory testing.  Addendum #2 performed by Betsy Pries, MD.   Electronically signed 04/07/2019 3:39:10PM  The electronic signature indicates that the named Attending Pathologist has evaluated the specimen Technical component performed at [facility], [address] [place], [place] [address] Lab: [phone] Dir: Rush Farmer, MD, MMM  Professional component performed at [facility], [facility], [place], [place], [place] [address] Lab: [phone] Dir: Dellia Nims. Rubinas, MD    Assessment:  Terri Tanner is a 58 y.o. female with clinical stage IA right breast cancer s/p ultrasound  guided biopsy on 04/03/2019.  Pathology revealed grade I invasive mammary carcinoma, no special type.  Biopsy was 7 mm.  No DCIS or lymphovascular invasion was identified.  Tumor was ER + (> 90%) and PR + (40%), and Her2/neu negative.  Clinical stage is T1cN0.  Bilateral diagnostic mammogram on 03/24/2019 revealed a suspicious 1.1 cm mass in the right breast at the 11 o'clock position and a 0.5 cm oval circumscribed mass in the upper outer left breast. Targeted ultrasound of the right breast revealed an irregular 1.3 x 0.8 x 1.1 cm hypoechoic mass at 11 o'clock 4 cm from the nipple.  Lymph nodes were unremarkable.  Targeted ultrasound of the left breast revealed a cluster of microcysts at 2 o'clock 6 cm from nipple measuring 0.7 x 0.2 x 0.7 cm.   She has a family history of breast cancer, ovarian cancer, endometrial cancer, brain tumors, and smoking related cancers (lung and head and neck).  She has a history of chronic urticariasince 12/2018 and mild leukocytosissince 02/09/2019. WBChas ranged between 11,100 and 13,600. Differential has been predominantly neutrophils. Etiology is felt reactive. CRP has been elevated.  Work-up on 02/22/2019 revealed a hematocrit of 40.8, hemoglobin 13.9, MCV 87.4, platelets 296,000, white count 12,700 with an ANC of 10,300.  Differential included 80% segs, 13% lymphs, 5% monocytes, and 1% eosinophils.  ANA was negative.  Tryptase was 8.5 (2.2-13.2).  SPEP revealed no monoclonal protein.  IgG was 621, IgA 141, IgM 115, and IgE 4 (low).  Peripheral smear revealed mild leukocytosis with neutrophilia and minimal left shifted maturation.  There is no significant morphologic abnormalities of leukocytes.  There was unremarkable RBC morphology and indices.  There was normal platelet count and morphology.    She has chronic urticariaand intermittent angioedema of her lips. Etiology does not appear related to any medications, herbal products, foods, inset bites,  infections, or NSAIDs. She has no apparent autoimmune disease.  Symptomatically, she is doing well.  Exam reveals post biopsy changes in the right breast.  Plan: 1.   Labs today:  CBC with diff, CMP, CA27.29. 2.   [facility] genetic testing. 3.   Clinical stage IA right breast cancer  Discuss mammogram and right breast biopsy.  Discuss plan for surgery.  Patient has met with Dr Bary Castilla.  She states that she does not want radiation or chemotherapy.  She is considering mastectomy.  Based on current information discuss likley plan for endocrine therapy.   Discuss tamoxifen vs aromatase inhibitors.  Discuss consideration of Oncotype DX testing unless she would not consider chemotherapy. 4.   Leukocytosis, resolved WBC 6500 with an ANC of 3900.  Continue to monitor. 5.   Chronic urticaria Etiology remains unclear.              There were no inciting events.             She has no evidence of a myeloproliferative disorder, paraprotein or mastocytosis. Allergy testing was normal.  Rash comes and goes.  Continue to monitor. 6.   Weight loss  Check TSH. 7.   Patient to call clinic after surgery for review of pathology and discussion regarding direction of therapy.   I discussed the assessment and treatment plan with the patient.  The patient was provided an opportunity to ask questions and all were answered.  The patient agreed with the plan and demonstrated an understanding of the instructions.  The patient was advised to call back if the symptoms worsen or if the condition fails to improve as anticipated.  I provided 44 minutes of face-to-face time during this this encounter and > 50% was spent counseling as documented under my assessment and plan.    Lequita Asal, MD, PhD    04/13/2019, 3:17 PM  I, Selena Batten, am acting as scribe for [employer]. Mike Gip, MD, PhD.  I, Melissa C. Mike Gip, MD, have reviewed the above documentation for  accuracy and completeness, and I agree with the above.

## 2019-04-13 ENCOUNTER — Inpatient Hospital Stay: Payer: BLUE CROSS/BLUE SHIELD

## 2019-04-13 ENCOUNTER — Encounter: Payer: Self-pay | Admitting: Hematology and Oncology

## 2019-04-13 ENCOUNTER — Other Ambulatory Visit: Payer: Self-pay

## 2019-04-13 ENCOUNTER — Encounter: Payer: Self-pay | Admitting: *Deleted

## 2019-04-13 ENCOUNTER — Inpatient Hospital Stay: Payer: BLUE CROSS/BLUE SHIELD | Attending: Hematology and Oncology | Admitting: Hematology and Oncology

## 2019-04-13 VITALS — BP 109/66 | HR 86 | Temp 97.0°F | Resp 18 | Ht 62.0 in | Wt 154.9 lb

## 2019-04-13 DIAGNOSIS — R634 Abnormal weight loss: Secondary | ICD-10-CM | POA: Diagnosis not present

## 2019-04-13 DIAGNOSIS — L509 Urticaria, unspecified: Secondary | ICD-10-CM | POA: Insufficient documentation

## 2019-04-13 DIAGNOSIS — Z8041 Family history of malignant neoplasm of ovary: Secondary | ICD-10-CM | POA: Diagnosis not present

## 2019-04-13 DIAGNOSIS — Z8049 Family history of malignant neoplasm of other genital organs: Secondary | ICD-10-CM | POA: Insufficient documentation

## 2019-04-13 DIAGNOSIS — Z803 Family history of malignant neoplasm of breast: Secondary | ICD-10-CM | POA: Insufficient documentation

## 2019-04-13 DIAGNOSIS — C50411 Malignant neoplasm of upper-outer quadrant of right female breast: Secondary | ICD-10-CM | POA: Diagnosis present

## 2019-04-13 DIAGNOSIS — E119 Type 2 diabetes mellitus without complications: Secondary | ICD-10-CM | POA: Insufficient documentation

## 2019-04-13 DIAGNOSIS — E669 Obesity, unspecified: Secondary | ICD-10-CM | POA: Diagnosis not present

## 2019-04-13 DIAGNOSIS — D72829 Elevated white blood cell count, unspecified: Secondary | ICD-10-CM | POA: Diagnosis not present

## 2019-04-13 DIAGNOSIS — Z170 Estrogen receptor positive status [ER+]: Secondary | ICD-10-CM | POA: Diagnosis not present

## 2019-04-13 LAB — COMPREHENSIVE METABOLIC PANEL
ALT: 18 U/L (ref 0–44)
AST: 17 U/L (ref 15–41)
Albumin: 4.2 g/dL (ref 3.5–5.0)
Alkaline Phosphatase: 90 U/L (ref 38–126)
Anion gap: 11 (ref 5–15)
BUN: 13 mg/dL (ref 6–20)
CO2: 23 mmol/L (ref 22–32)
Calcium: 9.3 mg/dL (ref 8.9–10.3)
Chloride: 104 mmol/L (ref 98–111)
Creatinine, Ser: 0.68 mg/dL (ref 0.44–1.00)
GFR calc Af Amer: >60 mL/min (ref >60)
GFR calc non Af Amer: >60 mL/min (ref >60)
Glucose, Bld: 94 mg/dL (ref 70–99)
Potassium: 4 mmol/L (ref 3.5–5.1)
Sodium: 138 mmol/L (ref 135–145)
Total Bilirubin: 0.4 mg/dL (ref 0.3–1.2)
Total Protein: 7.3 g/dL (ref 6.5–8.1)

## 2019-04-13 LAB — CBC WITH DIFFERENTIAL/PLATELET
Abs Immature Granulocytes: 0.01 10*3/uL (ref 0.00–0.07)
Basophils Absolute: 0 10*3/uL (ref 0.0–0.1)
Basophils Relative: 0 %
Eosinophils Absolute: 0.1 10*3/uL (ref 0.0–0.5)
Eosinophils Relative: 2 %
HCT: 42.4 % (ref 36.0–46.0)
Hemoglobin: 14.5 g/dL (ref 12.0–15.0)
Immature Granulocytes: 0 %
Lymphocytes Relative: 29 %
Lymphs Abs: 1.9 10*3/uL (ref 0.7–4.0)
MCH: 29.7 pg (ref 26.0–34.0)
MCHC: 34.2 g/dL (ref 30.0–36.0)
MCV: 86.7 fL (ref 80.0–100.0)
Monocytes Absolute: 0.6 10*3/uL (ref 0.1–1.0)
Monocytes Relative: 9 %
Neutro Abs: 3.9 10*3/uL (ref 1.7–7.7)
Neutrophils Relative %: 60 %
Platelets: 260 10*3/uL (ref 150–400)
RBC: 4.89 MIL/uL (ref 3.87–5.11)
RDW: 12.4 % (ref 11.5–15.5)
WBC: 6.5 10*3/uL (ref 4.0–10.5)
nRBC: 0 % (ref 0.0–0.2)

## 2019-04-13 LAB — TSH: TSH: 2.521 u[IU]/mL (ref 0.350–4.500)

## 2019-04-13 NOTE — Progress Notes (Signed)
No new changes noted today 

## 2019-04-13 NOTE — Progress Notes (Signed)
Met patient during initial medical oncology consult with Dr. Mike Gip.  Patient is considering mastectomy.  Patient with family history of a sister with ovarian and endometrial cancer.  Mom with lung cancer, dad with throat cancer, maternal aunt with breast cancer and her 2 maternal first cousins with brain tumors.  Patient to get genetic testing today.  She is to call me with any questions or needs.

## 2019-04-14 LAB — CANCER ANTIGEN 27.29: CA 27.29: 13.9 U/mL (ref 0.0–38.6)

## 2019-04-24 ENCOUNTER — Encounter: Payer: Self-pay | Admitting: Family Medicine

## 2019-04-26 ENCOUNTER — Telehealth: Payer: Self-pay

## 2019-04-26 NOTE — Telephone Encounter (Signed)
Copied from [place] [phone]. Topic: Referral - Request for Referral >> Apr 26, 2019 11:18 AM Scherrie Gerlach wrote: Pt has mychart message with reply from Dr Ancil Boozer to call Marland Kitchen so you can find out what surgeons are in her network. No answer at your number.  Please call thanks  I have recommended [facility] to Dr. Ancil Boozer.

## 2019-04-27 ENCOUNTER — Other Ambulatory Visit: Payer: Self-pay | Admitting: General Surgery

## 2019-04-27 ENCOUNTER — Encounter: Payer: Self-pay | Admitting: Hematology and Oncology

## 2019-04-27 ENCOUNTER — Encounter: Payer: Self-pay | Admitting: *Deleted

## 2019-04-27 DIAGNOSIS — C50411 Malignant neoplasm of upper-outer quadrant of right female breast: Secondary | ICD-10-CM

## 2019-04-27 NOTE — Progress Notes (Signed)
Patient called yesterday afternoon with concerns about her genetic results, and she did not think she would be able to have her surgery with Dr. Bary Castilla.  She discussed getting another Psychologist, sport and exercise. I called her today with negative results of her genetic test, and that I could schedule her with Dr. Marlou Starks per request of [place].  Patient states she can see Dr. Bary Castilla and has surgery planned for 05/05/19.  She is to call with any questions or needs.

## 2019-04-28 ENCOUNTER — Telehealth: Payer: Self-pay | Admitting: Family Medicine

## 2019-04-28 NOTE — Telephone Encounter (Signed)
Pt scheduled 123/02 at 1:40 with Dr Ancil Boozer.  If Dr wants to see her before then, she will need to approve a work in, as this was the first available.

## 2019-05-01 ENCOUNTER — Encounter
Admission: RE | Admit: 2019-05-01 | Discharge: 2019-05-01 | Disposition: A | Discharge status: Home / Self Care | Payer: BLUE CROSS/BLUE SHIELD | Source: Ambulatory Visit | Attending: General Surgery | Admitting: General Surgery

## 2019-05-01 ENCOUNTER — Other Ambulatory Visit: Payer: Self-pay | Admitting: General Surgery

## 2019-05-01 ENCOUNTER — Other Ambulatory Visit: Payer: Self-pay

## 2019-05-01 HISTORY — DX: Other allergic rhinitis: J30.89

## 2019-05-01 NOTE — Patient Instructions (Signed)
Your procedure is scheduled on: 05/05/2019 Fri Report to [facility] breast center at the instructed time. (10:45 am)  Remember: Instructions that are not followed completely may result in serious medical risk, up to and including death, or upon the discretion of your surgeon and anesthesiologist your surgery may need to be rescheduled.    _x___ 1. Do not eat food after midnight the night before your procedure. You may drink clear liquids up to 2 hours before you are scheduled to arrive at the hospital for your procedure.  Do not drink clear liquids within 2 hours of your scheduled arrival to the hospital.  Clear liquids include  --Water or Apple juice without pulp  --Clear carbohydrate beverage such as ClearFast or Gatorade  --Black Coffee or Clear Tea (No milk, no creamers, do not add anything to                  the coffee or Tea Type 1 and type 2 diabetics should only drink water.   ____Ensure clear carbohydrate drink on the way to the hospital for bariatric patients  ____Ensure clear carbohydrate drink 3 hours before surgery.   No gum chewing or hard candies.     __x__ 2. No Alcohol for 24 hours before or after surgery.   __x__3. No Smoking or e-cigarettes for 24 prior to surgery.  Do not use any chewable tobacco products for at least 6 hour prior to surgery   ____  4. Bring all medications with you on the day of surgery if instructed.    __x__ 5. Notify your doctor if there is any change in your medical condition     (cold, fever, infections).    x___6. On the morning of surgery brush your teeth with toothpaste and water.  You may rinse your mouth with mouth wash if you wish.  Do not swallow any toothpaste or mouthwash.   Do not wear jewelry, make-up, hairpins, clips or nail polish.  Do not wear lotions, powders, or perfumes. You may wear deodorant.  Do not shave 48 hours prior to surgery. Men may shave face and neck.  Do not bring valuables to the hospital.    [facility] is  not responsible for any belongings or valuables.               Contacts, dentures or bridgework may not be worn into surgery.  Leave your suitcase in the car. After surgery it may be brought to your room.  For patients admitted to the hospital, discharge time is determined by your                       treatment team.  _  Patients discharged the day of surgery will not be allowed to drive home.  You will need someone to drive you home and stay with you the night of your procedure.    Please read over the following fact sheets that you were given:   [employer] Preparing for Surgery and or MRSA Information   _x___ Take anti-hypertensive listed below, cardiac, seizure, asthma,     anti-reflux and psychiatric medicines. These include:  1. famotidine (PEPCID) 40 MG tablet  2.hydrOXYzine (ATARAX/VISTARIL) 25 MG tablet  3.loratadine (CLARITIN) 10 MG tablet  4.  5.  6.  ____Fleets enema or Magnesium Citrate as directed.   _x___ Use CHG Soap or sage wipes as directed on instruction sheet   ____ Use inhalers on the day of surgery and bring  to hospital day of surgery  ____ Stop Metformin and Janumet 2 days prior to surgery.    ____ Take 1/2 of usual insulin dose the night before surgery and none on the morning     surgery.   _x___ Follow recommendations from Cardiologist, Pulmonologist or PCP regarding          stopping Aspirin, Coumadin, Plavix ,Eliquis, Effient, or Pradaxa, and Pletal.  X____Stop Anti-inflammatories such as Advil, Aleve, Ibuprofen, Motrin, Naproxen, Naprosyn, Goodies powders or aspirin products. OK to take Tylenol and                          Celebrex.   _x___ Stop supplements until after surgery.  But may continue Vitamin D, Vitamin B,       and multivitamin.   ____ Bring C-Pap to the hospital.

## 2019-05-02 ENCOUNTER — Other Ambulatory Visit
Admission: RE | Admit: 2019-05-02 | Discharge: 2019-05-02 | Disposition: A | Discharge status: Home / Self Care | Payer: BLUE CROSS/BLUE SHIELD | Source: Ambulatory Visit | Attending: General Surgery | Admitting: General Surgery

## 2019-05-02 DIAGNOSIS — Z20828 Contact with and (suspected) exposure to other viral communicable diseases: Secondary | ICD-10-CM | POA: Diagnosis not present

## 2019-05-02 DIAGNOSIS — Z01812 Encounter for preprocedural laboratory examination: Secondary | ICD-10-CM | POA: Insufficient documentation

## 2019-05-02 LAB — SARS CORONAVIRUS 2 (TAT 6-24 HRS): SARS Coronavirus 2: NEGATIVE

## 2019-05-04 ENCOUNTER — Encounter: Payer: Self-pay | Admitting: Family Medicine

## 2019-05-04 ENCOUNTER — Ambulatory Visit (INDEPENDENT_AMBULATORY_CARE_PROVIDER_SITE_OTHER): Payer: BLUE CROSS/BLUE SHIELD | Admitting: Family Medicine

## 2019-05-04 ENCOUNTER — Other Ambulatory Visit: Payer: Self-pay

## 2019-05-04 VITALS — BP 110/60 | HR 79 | Temp 96.9°F | Resp 16 | Ht 62.0 in | Wt 156.8 lb

## 2019-05-04 DIAGNOSIS — E118 Type 2 diabetes mellitus with unspecified complications: Secondary | ICD-10-CM | POA: Diagnosis not present

## 2019-05-04 DIAGNOSIS — E785 Hyperlipidemia, unspecified: Secondary | ICD-10-CM

## 2019-05-04 DIAGNOSIS — E559 Vitamin D deficiency, unspecified: Secondary | ICD-10-CM

## 2019-05-04 DIAGNOSIS — Z170 Estrogen receptor positive status [ER+]: Secondary | ICD-10-CM

## 2019-05-04 DIAGNOSIS — L509 Urticaria, unspecified: Secondary | ICD-10-CM | POA: Diagnosis not present

## 2019-05-04 DIAGNOSIS — C50411 Malignant neoplasm of upper-outer quadrant of right female breast: Secondary | ICD-10-CM

## 2019-05-04 DIAGNOSIS — E1169 Type 2 diabetes mellitus with other specified complication: Secondary | ICD-10-CM

## 2019-05-04 NOTE — Progress Notes (Signed)
Name: Terri Tanner   MRN: [id number]    DOB: December 17, 1960   Date:05/04/2019       Progress Note  Subjective  Chief Complaint  Chief Complaint  Patient presents with  . Follow-up    HPI  Hives: going on since July, seen by Dr. Donneta Romberg, had negative allergy testing, taking medication around the clock, symptoms worse in am  Breast Cancer right side: she is going to have a mastectomy tomorrow ( Dr. Fleet Contras ) she has already discussed it with Dr. Mike Gip ( oncologist) , at this time she is not interested on having chemo or radiation therapy. Discussed importance of going open minded and decide after the surgery what would be the best option for her.   Diabetes with dyslipidemia: she has changed her diet, he weight is trending down but not at the same speed as it was initially with dietary modification. She has been avoiding sugar even more since diagnosed with cancer. She denies polyphagia, polydipsia or polyuria. She has dyslipidemia but refuses statin therapy, however she is willing to recheck labs today   Vitamin D deficiency: she is taking supplementation    Patient Active Problem List   Diagnosis Date Noted  . Malignant neoplasm of upper-outer quadrant of right breast in female, estrogen receptor positive ([place]) 04/13/2019  . Urticaria 02/22/2019  . Long-term use of high-risk medication 05/12/2018  . Rectal polyp   . Diabetes mellitus type 2 in obese ([place]) 02/06/2018  . Dyslipidemia associated with type 2 diabetes mellitus ([place]) 02/06/2018  . GERD (gastroesophageal reflux disease) 02/04/2018  . Vitamin D deficiency 02/04/2018  . Chronic cough 02/04/2018  . Obesity (BMI 30-39.9) 02/04/2018    Past Surgical History:  Procedure Laterality Date  . CERVICAL POLYPECTOMY    . CESAREAN SECTION    . COLONOSCOPY WITH PROPOFOL N/A 03/03/2018   Procedure: COLONOSCOPY WITH PROPOFOL;  Surgeon: Jonathon Bellows, MD;  Location: [facility] ENDOSCOPY;  Service: Gastroenterology;  Laterality: N/A;  .  TRANSANAL EXCISION OF RECTAL MASS N/A 04/14/2018   Procedure: TRANSANAL EXCISION OF RECTAL POLYP;  Surgeon: Jules Husbands, MD;  Location: [facility] ORS;  Service: General;  Laterality: N/A;  . WISDOM TOOTH EXTRACTION    . WISDOM TOOTH EXTRACTION      Family History  Problem Relation Age of Onset  . Lung cancer Mother        smoked for many years  . COPD Mother   . Emphysema Mother   . Esophageal cancer Father        smoker  . Suicidality Father   . Cancer Sister   . Stroke Brother   . Alcohol abuse Brother   . Other Brother        huge smoker  . Diabetes Sister   . Breast cancer Neg Hx     Social History   Socioeconomic History  . Marital status: Married    Spouse name: Alvester Chou  . Number of children: 1  . Years of education: Not on file  . Highest education level: High school graduate  Occupational History  . Occupation: stay at home   Social Needs  . Financial resource strain: Not hard at all  . Food insecurity    Worry: Never true    Inability: Never true  . Transportation needs    Medical: No    Non-medical: No  Tobacco Use  . Smoking status: Never Smoker  . Smokeless tobacco: Never Used  Substance and Sexual Activity  . Alcohol use: Not Currently  Comment: rarely  . Drug use: Never  . Sexual activity: Not Currently    Partners: Male    Birth control/protection: None, Surgical  Lifestyle  . Physical activity    Days per week: 3 days    Minutes per session: 30 min  . Stress: Not at all  Relationships  . Social connections    Talks on phone: More than three times a week    Gets together: More than three times a week    Attends religious service: More than 4 times per year    Active member of club or organization: Yes    Attends meetings of clubs or organizations: 1 to 4 times per year    Relationship status: Married  . Intimate partner violence    Fear of current or ex partner: No    Emotionally abused: No    Physically abused: No    Forced sexual  activity: No  Other Topics Concern  . Not on file  Social History Narrative   Patient  moved here from [place], [place] 07/2017   They moved in with their only daughter to home school the grandchildren     Current Outpatient Medications:  .  EPINEPHrine 0.3 mg/0.3 mL IJ SOAJ injection, Inject 0.3 mLs (0.3 mg total) into the muscle as needed for anaphylaxis., Disp: 2 each, Rfl: 0 .  famotidine (PEPCID) 40 MG tablet, Take 40 mg by mouth 2 (two) times daily., Disp: , Rfl:  .  hydrOXYzine (ATARAX/VISTARIL) 25 MG tablet, Take 25 mg by mouth 3 (three) times daily., Disp: , Rfl:  .  levocetirizine (XYZAL) 5 MG tablet, Take 5 mg by mouth at bedtime. , Disp: , Rfl:  .  loratadine (CLARITIN) 10 MG tablet, Take 1 tablet (10 mg total) by mouth 2 (two) times daily., Disp: 60 tablet, Rfl: 0 .  Multiple Vitamins-Minerals (MULTIVITAMIN PO), Take 1 packet by mouth 2 (two) times daily. Packets include: ProvexCV (1 capsule twice daily), Recover Al (1 tablet twice daily), Florify (1 capsule in evening), CardiOmega EPA (1 softgels twice daily), Vitality Multivitamin & Mineral (1 tablet twice daily), CellWise (1 tablet twice daily), Disp: , Rfl:  .  acetaminophen (TYLENOL) 325 MG tablet, Take 325 mg by mouth every 6 (six) hours as needed (for pain.)., Disp: , Rfl:   No Known Allergies  I personally reviewed active problem list, medication list, allergies, family history, social history, health maintenance with the patient/caregiver today.   ROS  Constitutional: Negative for fever , positive for  weight change.  Respiratory: Negative for cough and shortness of breath.   Cardiovascular: Negative for chest pain or palpitations.  Gastrointestinal: Negative for abdominal pain, no bowel changes.  Musculoskeletal: Negative for gait problem or joint swelling.  Skin:positive for hives  Neurological: Negative for dizziness or headache.  No other specific complaints in a complete review of systems (except as listed  in HPI above).  Objective  Vitals:   05/04/19 1209  BP: 110/60  Pulse: 79  Resp: 16  Temp: (!) 96.9 F (36.1 C)  TempSrc: Temporal  SpO2: 97%  Weight: 156 lb 12.8 oz (71.1 kg)  Height: 5\' 2"  (1.575 m)    Body mass index is 28.68 kg/m.  Physical Exam  Constitutional: Patient appears well-developed and well-nourished. Obese No distress.  HEENT: head atraumatic, normocephalic, pupils equal and reactive to light Cardiovascular: Normal rate, regular rhythm and normal heart sounds.  No murmur heard. No BLE edema. Pulmonary/Chest: Effort normal and breath sounds normal. No respiratory  distress. Abdominal: Soft.  There is no tenderness. Psychiatric: Patient has a normal mood and affect. behavior is normal. Judgment and thought content normal.    PHQ2/9: Depression screen [facility] 2/9 05/04/2019 02/09/2019 11/10/2018 07/14/2018 05/12/2018  Decreased Interest 0 0 0 0 0  Down, Depressed, Hopeless 0 0 0 0 0  PHQ - 2 Score 0 0 0 0 0  Altered sleeping 0 0 0 0 0  Tired, decreased energy 0 0 0 0 0  Change in appetite 0 0 0 0 0  Feeling bad or failure about yourself  0 0 0 0 0  Trouble concentrating 0 0 0 0 0  Moving slowly or fidgety/restless 0 0 0 0 0  Suicidal thoughts 0 0 0 0 0  PHQ-9 Score 0 0 0 0 0  Difficult doing work/chores - - Not difficult at all Not difficult at all Not difficult at all    phq 9 is negative  Fall Risk: Fall Risk  05/04/2019 02/09/2019 11/10/2018 07/14/2018 05/12/2018  Falls in the past year? 0 0 0 0 0  Number falls in past yr: 0 0 0 0 -  Injury with Fall? 0 0 0 0 -  Follow up - - - Falls evaluation completed -     Functional Status Survey: Is the patient deaf or have difficulty hearing?: No Does the patient have difficulty seeing, even when wearing glasses/contacts?: No Does the patient have difficulty concentrating, remembering, or making decisions?: No Does the patient have difficulty walking or climbing stairs?: No Does the patient have difficulty  dressing or bathing?: No Does the patient have difficulty doing errands alone such as visiting a doctor's office or shopping?: No    Assessment & Plan  1. Controlled type 2 diabetes mellitus with complication, without long-term current use of insulin (HCC)  - Hemoglobin A1c - Lipid panel - Microalbumin / creatinine urine ratio  2. Urticaria  Seeing Dr. Donneta Romberg  3. Vitamin D deficiency  Continue supplementation  4. Malignant neoplasm of upper-outer quadrant of right breast in female, estrogen receptor positive ([place])  Having surgery tomorrow   5. Dyslipidemia associated with type 2 diabetes mellitus ([place])  Refuses statin therapy

## 2019-05-05 ENCOUNTER — Ambulatory Visit
Admission: RE | Admit: 2019-05-05 | Discharge: 2019-05-05 | Disposition: A | Discharge status: Home / Self Care | Payer: BLUE CROSS/BLUE SHIELD | Attending: General Surgery | Admitting: General Surgery

## 2019-05-05 ENCOUNTER — Ambulatory Visit: Payer: BLUE CROSS/BLUE SHIELD | Admitting: Certified Registered"

## 2019-05-05 ENCOUNTER — Ambulatory Visit
Admission: RE | Admit: 2019-05-05 | Discharge: 2019-05-05 | Disposition: A | Discharge status: Home / Self Care | Payer: BLUE CROSS/BLUE SHIELD | Source: Ambulatory Visit | Attending: General Surgery | Admitting: General Surgery

## 2019-05-05 ENCOUNTER — Other Ambulatory Visit: Payer: Self-pay

## 2019-05-05 ENCOUNTER — Encounter
Admission: RE | Disposition: A | Discharge status: Home / Self Care | Payer: Self-pay | Source: Home / Self Care | Attending: General Surgery

## 2019-05-05 DIAGNOSIS — K219 Gastro-esophageal reflux disease without esophagitis: Secondary | ICD-10-CM | POA: Diagnosis not present

## 2019-05-05 DIAGNOSIS — E119 Type 2 diabetes mellitus without complications: Secondary | ICD-10-CM | POA: Diagnosis not present

## 2019-05-05 DIAGNOSIS — J302 Other seasonal allergic rhinitis: Secondary | ICD-10-CM | POA: Diagnosis not present

## 2019-05-05 DIAGNOSIS — C50911 Malignant neoplasm of unspecified site of right female breast: Secondary | ICD-10-CM | POA: Insufficient documentation

## 2019-05-05 DIAGNOSIS — E669 Obesity, unspecified: Secondary | ICD-10-CM | POA: Insufficient documentation

## 2019-05-05 DIAGNOSIS — C50411 Malignant neoplasm of upper-outer quadrant of right female breast: Secondary | ICD-10-CM | POA: Diagnosis not present

## 2019-05-05 DIAGNOSIS — Z170 Estrogen receptor positive status [ER+]: Secondary | ICD-10-CM | POA: Insufficient documentation

## 2019-05-05 DIAGNOSIS — Z6827 Body mass index (BMI) 27.0-27.9, adult: Secondary | ICD-10-CM | POA: Insufficient documentation

## 2019-05-05 DIAGNOSIS — Z79899 Other long term (current) drug therapy: Secondary | ICD-10-CM | POA: Diagnosis not present

## 2019-05-05 HISTORY — PX: SIMPLE MASTECTOMY WITH AXILLARY SENTINEL NODE BIOPSY: SHX6098

## 2019-05-05 HISTORY — PX: SENTINEL NODE BIOPSY: SHX6608

## 2019-05-05 LAB — MICROALBUMIN / CREATININE URINE RATIO
Creatinine, Urine: 111 mg/dL (ref 20–275)
Microalb Creat Ratio: 5 mcg/mg creat (ref <30)
Microalb, Ur: 0.5 mg/dL

## 2019-05-05 LAB — LIPID PANEL
Cholesterol: 308 mg/dL — ABNORMAL HIGH (ref <200)
HDL: 57 mg/dL (ref >=50)
LDL Cholesterol (Calc): 200 mg/dL (calc) — ABNORMAL HIGH
Non-HDL Cholesterol (Calc): 251 mg/dL (calc) — ABNORMAL HIGH (ref <130)
Total CHOL/HDL Ratio: 5.4 (calc) — ABNORMAL HIGH (ref <5.0)
Triglycerides: 288 mg/dL — ABNORMAL HIGH (ref <150)

## 2019-05-05 LAB — HEMOGLOBIN A1C
Hgb A1c MFr Bld: 6.2 % of total Hgb — ABNORMAL HIGH (ref <5.7)
Mean Plasma Glucose: 131 (calc)
eAG (mmol/L): 7.3 (calc)

## 2019-05-05 LAB — GLUCOSE, CAPILLARY: Glucose-Capillary: 105 mg/dL — ABNORMAL HIGH (ref 70–99)

## 2019-05-05 SURGERY — SIMPLE MASTECTOMY
Anesthesia: General | Site: Breast | Laterality: Right

## 2019-05-05 MED ORDER — METHYLENE BLUE 0.5 % INJ SOLN
INTRAVENOUS | Status: DC | PRN
  Administered 2019-05-05: 5 mL via SUBMUCOSAL

## 2019-05-05 MED ORDER — OXYCODONE HCL 5 MG/5ML PO SOLN: 5.0000 mg | Freq: Once | ORAL | Status: AC | PRN

## 2019-05-05 MED ORDER — ACETAMINOPHEN 10 MG/ML IV SOLN
INTRAVENOUS | Status: DC | PRN
  Administered 2019-05-05: 1000 mg via INTRAVENOUS

## 2019-05-05 MED ORDER — PHENYLEPHRINE HCL (PRESSORS) 10 MG/ML IV SOLN
INTRAVENOUS | Status: DC | PRN
  Administered 2019-05-05: 100 ug via INTRAVENOUS

## 2019-05-05 MED ORDER — LACTATED RINGERS IV SOLN
INTRAVENOUS | Status: DC | PRN
  Administered 2019-05-05: 12:00:00 via INTRAVENOUS

## 2019-05-05 MED ORDER — GLYCOPYRROLATE 0.2 MG/ML IJ SOLN
INTRAMUSCULAR | Status: AC
  Filled 2019-05-05: qty 2

## 2019-05-05 MED ORDER — MEPERIDINE HCL 50 MG/ML IJ SOLN: 6.2500 mg | INTRAMUSCULAR | Status: DC | PRN

## 2019-05-05 MED ORDER — GABAPENTIN 300 MG PO CAPS
ORAL_CAPSULE | ORAL | Status: AC
  Filled 2019-05-05: qty 1

## 2019-05-05 MED ORDER — DEXMEDETOMIDINE HCL IN NACL 200 MCG/50ML IV SOLN
INTRAVENOUS | Status: DC | PRN
  Administered 2019-05-05 (×2): 4 ug via INTRAVENOUS

## 2019-05-05 MED ORDER — PROPOFOL 10 MG/ML IV BOLUS
INTRAVENOUS | Status: DC | PRN
  Administered 2019-05-05: 50 mg via INTRAVENOUS
  Administered 2019-05-05: 150 mg via INTRAVENOUS

## 2019-05-05 MED ORDER — ONDANSETRON HCL 4 MG/2ML IJ SOLN
INTRAMUSCULAR | Status: DC | PRN
  Administered 2019-05-05 (×2): 4 mg via INTRAVENOUS

## 2019-05-05 MED ORDER — PROMETHAZINE HCL 25 MG/ML IJ SOLN: 6.2500 mg | INTRAMUSCULAR | Status: DC | PRN

## 2019-05-05 MED ORDER — FENTANYL CITRATE (PF) 100 MCG/2ML IJ SOLN
INTRAMUSCULAR | Status: DC | PRN
  Administered 2019-05-05 (×4): 25 ug via INTRAVENOUS

## 2019-05-05 MED ORDER — KETOROLAC TROMETHAMINE 30 MG/ML IJ SOLN
INTRAMUSCULAR | Status: DC | PRN
  Administered 2019-05-05: 30 mg via INTRAVENOUS

## 2019-05-05 MED ORDER — OXYCODONE HCL 5 MG PO TABS
5.0000 mg | ORAL_TABLET | Freq: Once | ORAL | Status: AC | PRN
  Administered 2019-05-05: 5 mg via ORAL

## 2019-05-05 MED ORDER — OXYCODONE HCL 5 MG PO TABS
ORAL_TABLET | ORAL | Status: AC
  Filled 2019-05-05: qty 1

## 2019-05-05 MED ORDER — MIDAZOLAM HCL 2 MG/2ML IJ SOLN
INTRAMUSCULAR | Status: AC
  Filled 2019-05-05: qty 2

## 2019-05-05 MED ORDER — DEXAMETHASONE SODIUM PHOSPHATE 10 MG/ML IJ SOLN
INTRAMUSCULAR | Status: DC | PRN
  Administered 2019-05-05: 10 mg via INTRAVENOUS

## 2019-05-05 MED ORDER — CEFAZOLIN SODIUM-DEXTROSE 2-4 GM/100ML-% IV SOLN
2.0000 g | INTRAVENOUS | Status: AC
  Administered 2019-05-05: 2 g via INTRAVENOUS

## 2019-05-05 MED ORDER — DEXAMETHASONE SODIUM PHOSPHATE 10 MG/ML IJ SOLN
INTRAMUSCULAR | Status: AC
  Filled 2019-05-05: qty 2

## 2019-05-05 MED ORDER — ONDANSETRON HCL 4 MG/2ML IJ SOLN
INTRAMUSCULAR | Status: AC
  Filled 2019-05-05: qty 8

## 2019-05-05 MED ORDER — GABAPENTIN 300 MG PO CAPS
300.0000 mg | ORAL_CAPSULE | ORAL | Status: AC
  Administered 2019-05-05: 12:00:00 300 mg via ORAL

## 2019-05-05 MED ORDER — TECHNETIUM TC 99M SULFUR COLLOID FILTERED
0.9240 | Freq: Once | INTRAVENOUS | Status: AC | PRN
  Administered 2019-05-05: 0.924 via INTRADERMAL

## 2019-05-05 MED ORDER — KETOROLAC TROMETHAMINE 30 MG/ML IJ SOLN
INTRAMUSCULAR | Status: AC
  Filled 2019-05-05: qty 1

## 2019-05-05 MED ORDER — MIDAZOLAM HCL 2 MG/2ML IJ SOLN
INTRAMUSCULAR | Status: DC | PRN
  Administered 2019-05-05: 2 mg via INTRAVENOUS

## 2019-05-05 MED ORDER — DEXMEDETOMIDINE HCL IN NACL 80 MCG/20ML IV SOLN
INTRAVENOUS | Status: AC
  Filled 2019-05-05: qty 20

## 2019-05-05 MED ORDER — HYDROCODONE-ACETAMINOPHEN 5-325 MG PO TABS: 1.0000 | ORAL_TABLET | ORAL | 0 refills | Status: DC | PRN

## 2019-05-05 MED ORDER — LIDOCAINE HCL (CARDIAC) PF 100 MG/5ML IV SOSY
PREFILLED_SYRINGE | INTRAVENOUS | Status: DC | PRN
  Administered 2019-05-05: 100 mg via INTRAVENOUS

## 2019-05-05 MED ORDER — FENTANYL CITRATE (PF) 100 MCG/2ML IJ SOLN: 25.0000 ug | INTRAMUSCULAR | Status: DC | PRN

## 2019-05-05 MED ORDER — SODIUM CHLORIDE 0.9 % IV SOLN
INTRAVENOUS | Status: DC
  Administered 2019-05-05: 12:00:00 via INTRAVENOUS

## 2019-05-05 MED ORDER — ACETAMINOPHEN 10 MG/ML IV SOLN
INTRAVENOUS | Status: AC
  Filled 2019-05-05: qty 100

## 2019-05-05 MED ORDER — FENTANYL CITRATE (PF) 100 MCG/2ML IJ SOLN
INTRAMUSCULAR | Status: AC
  Filled 2019-05-05: qty 2

## 2019-05-05 MED ORDER — GLYCOPYRROLATE 0.2 MG/ML IJ SOLN
INTRAMUSCULAR | Status: DC | PRN
  Administered 2019-05-05: 0.2 mg via INTRAVENOUS

## 2019-05-05 MED ORDER — CEFAZOLIN SODIUM-DEXTROSE 2-4 GM/100ML-% IV SOLN
INTRAVENOUS | Status: AC
  Filled 2019-05-05: qty 100

## 2019-05-05 SURGICAL SUPPLY — 59 items
APPLIER CLIP 11 MED OPEN (CLIP)
APPLIER CLIP 13 LRG OPEN (CLIP)
BINDER BREAST LRG (GAUZE/BANDAGES/DRESSINGS) ×2 IMPLANT
BINDER BREAST MEDIUM (GAUZE/BANDAGES/DRESSINGS) IMPLANT
BINDER BREAST XLRG (GAUZE/BANDAGES/DRESSINGS) IMPLANT
BINDER BREAST XXLRG (GAUZE/BANDAGES/DRESSINGS) IMPLANT
BLADE PHOTON ILLUMINATED (MISCELLANEOUS) ×2 IMPLANT
BLADE SURG 15 STRL SS SAFETY (BLADE) ×4 IMPLANT
BULB RESERV EVAC DRAIN JP 100C (MISCELLANEOUS) ×4 IMPLANT
CANISTER SUCT 1200ML W/VALVE (MISCELLANEOUS) ×4 IMPLANT
CHLORAPREP W/TINT 26 (MISCELLANEOUS) ×4 IMPLANT
CLIP APPLIE 11 MED OPEN (CLIP) IMPLANT
CLIP APPLIE 13 LRG OPEN (CLIP) IMPLANT
CLOSURE WOUND 1/2 X4 (GAUZE/BANDAGES/DRESSINGS) ×2
CNTNR SPEC 2.5X3XGRAD LEK (MISCELLANEOUS) ×6
CONT SPEC 4OZ STER OR WHT (MISCELLANEOUS) ×6
CONTAINER SPEC 2.5X3XGRAD LEK (MISCELLANEOUS) ×6 IMPLANT
COVER PROBE FLX POLY STRL (MISCELLANEOUS) ×2 IMPLANT
COVER WAND RF STERILE (DRAPES) ×2 IMPLANT
DEVICE DUBIN SPECIMEN MAMMOGRA (MISCELLANEOUS) ×2 IMPLANT
DRAIN CHANNEL JP 15F RND 16 (MISCELLANEOUS) ×4 IMPLANT
DRAPE LAPAROTOMY TRNSV 106X77 (MISCELLANEOUS) ×4 IMPLANT
DRSG GAUZE FLUFF 36X18 (GAUZE/BANDAGES/DRESSINGS) ×6 IMPLANT
DRSG TELFA 3X8 NADH (GAUZE/BANDAGES/DRESSINGS) ×8 IMPLANT
ELECT CAUTERY BLADE TIP 2.5 (TIP) ×4
ELECT REM PT RETURN 9FT ADLT (ELECTROSURGICAL) ×4
ELECTRODE CAUTERY BLDE TIP 2.5 (TIP) ×2 IMPLANT
ELECTRODE REM PT RTRN 9FT ADLT (ELECTROSURGICAL) ×2 IMPLANT
GAUZE SPONGE 4X4 12PLY STRL (GAUZE/BANDAGES/DRESSINGS) ×4 IMPLANT
GLOVE BIO SURGEON STRL SZ7.5 (GLOVE) ×8 IMPLANT
GLOVE INDICATOR 8.0 STRL GRN (GLOVE) ×8 IMPLANT
GOWN STRL REUS W/ TWL LRG LVL3 (GOWN DISPOSABLE) ×4 IMPLANT
GOWN STRL REUS W/TWL LRG LVL3 (GOWN DISPOSABLE) ×8
KIT TURNOVER KIT A (KITS) ×4 IMPLANT
LABEL OR SOLS (LABEL) ×4 IMPLANT
NDL SAFETY ECLIPSE 18X1.5 (NEEDLE) ×2 IMPLANT
NEEDLE HYPO 18GX1.5 SHARP (NEEDLE) ×2
NEEDLE HYPO 22GX1.5 SAFETY (NEEDLE) ×4 IMPLANT
PACK BASIN MINOR ARMC (MISCELLANEOUS) ×4 IMPLANT
PAD DRESSING TELFA 3X8 NADH (GAUZE/BANDAGES/DRESSINGS) ×2 IMPLANT
PIN SAFETY STRL (MISCELLANEOUS) ×4 IMPLANT
RETRACTOR RING XSMALL (MISCELLANEOUS) IMPLANT
RTRCTR WOUND ALEXIS 13CM XS SH (MISCELLANEOUS)
SHEARS FOC LG CVD HARMONIC 17C (MISCELLANEOUS) IMPLANT
SLEVE PROBE SENORX GAMMA FIND (MISCELLANEOUS) ×4 IMPLANT
SPONGE LAP 18X18 RF (DISPOSABLE) ×4 IMPLANT
STRIP CLOSURE SKIN 1/2X4 (GAUZE/BANDAGES/DRESSINGS) ×6 IMPLANT
SUT ETHILON 3-0 FS-10 30 BLK (SUTURE) ×4
SUT SILK 2 0 (SUTURE) ×2
SUT SILK 2-0 30XBRD TIE 12 (SUTURE) ×2 IMPLANT
SUT VIC AB 2-0 CT1 27 (SUTURE) ×6
SUT VIC AB 2-0 CT1 TAPERPNT 27 (SUTURE) ×6 IMPLANT
SUT VIC AB 3-0 SH 27 (SUTURE) ×2
SUT VIC AB 3-0 SH 27X BRD (SUTURE) ×2 IMPLANT
SUT VICRYL+ 3-0 144IN (SUTURE) ×4 IMPLANT
SUTURE EHLN 3-0 FS-10 30 BLK (SUTURE) ×2 IMPLANT
SWABSTK COMLB BENZOIN TINCTURE (MISCELLANEOUS) ×4 IMPLANT
SYR 10ML LL (SYRINGE) ×4 IMPLANT
TAPE TRANSPORE STRL 2 31045 (GAUZE/BANDAGES/DRESSINGS) ×4 IMPLANT

## 2019-05-05 NOTE — Anesthesia Procedure Notes (Signed)
Procedure Name: LMA Insertion Performed by: Kelton Pillar, CRNA Pre-anesthesia Checklist: Patient identified, Emergency Drugs available, Suction available and Patient being monitored Patient Re-evaluated:Patient Re-evaluated prior to induction Oxygen Delivery Method: Circle system utilized Preoxygenation: Pre-oxygenation with 100% oxygen Induction Type: IV induction Ventilation: Mask ventilation without difficulty LMA: LMA inserted LMA Size: 3.5 Tube type: Oral Number of attempts: 1 Placement Confirmation: positive ETCO2,  CO2 detector and breath sounds checked- equal and bilateral Dental Injury: Teeth and Oropharynx as per pre-operative assessment

## 2019-05-05 NOTE — Anesthesia Preprocedure Evaluation (Signed)
Anesthesia Evaluation  Patient identified by MRN, date of birth, ID band Patient awake    Reviewed: Allergy & Precautions, NPO status , Patient's Chart, lab work & pertinent test results  History of Anesthesia Complications Negative for: history of anesthetic complications  Airway Mallampati: II  TM Distance: >3 FB Neck ROM: Full    Dental no notable dental hx.    Pulmonary neg pulmonary ROS, neg sleep apnea, neg COPD,    breath sounds clear to auscultation- rhonchi (-) wheezing      Cardiovascular Exercise Tolerance: Good (-) hypertension(-) CAD, (-) Past MI, (-) Cardiac Stents and (-) CABG  Rhythm:Regular Rate:Normal - Systolic murmurs and - Diastolic murmurs    Neuro/Psych neg Seizures negative neurological ROS  negative psych ROS   GI/Hepatic Neg liver ROS, GERD  ,  Endo/Other  diabetes (borderline)  Renal/GU negative Renal ROS     Musculoskeletal negative musculoskeletal ROS (+)   Abdominal (+) - obese,   Peds  Hematology negative hematology ROS (+)   Anesthesia Other Findings Past Medical History: No date: Allergy 02/06/2018: Diabetes mellitus type 2 in obese (HCC) No date: Environmental and seasonal allergies No date: GERD (gastroesophageal reflux disease)   Reproductive/Obstetrics                             Anesthesia Physical Anesthesia Plan  ASA: II  Anesthesia Plan: General   Post-op Pain Management:    Induction: Intravenous  PONV Risk Score and Plan: 2 and Dexamethasone, Ondansetron and Midazolam  Airway Management Planned: LMA  Additional Equipment:   Intra-op Plan:   Post-operative Plan:   Informed Consent: I have reviewed the patients History and Physical, chart, labs and discussed the procedure including the risks, benefits and alternatives for the proposed anesthesia with the patient or authorized representative who has indicated his/her understanding  and acceptance.     Dental advisory given  Plan Discussed with: CRNA and Anesthesiologist  Anesthesia Plan Comments:         Anesthesia Quick Evaluation

## 2019-05-05 NOTE — Anesthesia Post-op Follow-up Note (Signed)
Anesthesia QCDR form completed.        

## 2019-05-05 NOTE — Op Note (Signed)
Preoperative diagnosis: Right breast cancer.  Postoperative diagnosis: Same.  Operative procedure: Right simple mastectomy with sentinel node biopsy.  Operating surgeon: Malachi Carl, MD.  Assistant surgeon: Hervey Ard, MD  Anesthesia: General by LMA.  Estimated blood loss: Less than 30 cc.  Clinical note: This 58 year old woman was recently diagnosed with a T1b invasive mammary carcinoma of the right breast.  Given her options for management she has elected to proceed to simple mastectomy.  The main reason for this, by her report, was the desire not to have postoperative radiation if she chose breast conservation.  She was injected with technetium sulfur colloid prior to the procedure.  Operative note: The patient underwent general anesthesia by LMA without difficulty.  The periareolar skin was cleansed with alcohol followed by the injection of 5 cc of 1/2% methylene blue.  The breast chest and axilla was then cleansed with ChloraPrep and draped.  An elliptical incision was outlined and the skin incised with the knife followed by use of the photon blade cautery.  Flaps were elevated to the clavicle superiorly, sternum medially, rectus fascia inferiorly and the serratus muscle laterally.  Flaps were approximately 6 mm thick.  Hemostasis was with cautery and 3-0 Vicryl suture ligature.  The breast was elevated off the underlying pectoralis muscle taking the fascia of that muscle with the specimen.  The axillary envelope was opened.  Making use of the node seeker device the first hot node was identified.  This was stained blue.  Counts of 1000.  2 additional nodes about 8-10 mm in diameter identified with counts at 201 100 respectively.  No blue dye.  These were sent in formalin for routine histology.  After assuring good hemostasis the wound was irrigated with sterile water.  A 15 [place] Blake drain was placed through the inferior lateral aspect of the flap and anchored into place with  3-0 nylon.  The flaps were approximated with a running 2-0 Vicryl deep dermal suture.  Benzoin and Steri-Strips followed by Telfa, fluff gauze and a compressive wrap were applied.  The patient tolerated the procedure well and was taken to the recovery room in stable condition.

## 2019-05-05 NOTE — Transfer of Care (Signed)
Immediate Anesthesia Transfer of Care Note  Patient: Terri Tanner  Procedure(s) Performed: SIMPLE MASTECTOMY (Right Breast) SENTINEL NODE BIOPSY (Right )  Patient Location: PACU  Anesthesia Type:General  Level of Consciousness: awake, drowsy and patient cooperative  Airway & Oxygen Therapy: Patient Spontanous Breathing and Patient connected to face mask oxygen  Post-op Assessment: Report given to RN and Post -op Vital signs reviewed and stable  Post vital signs: Reviewed and stable  Last Vitals:  Vitals Value Taken Time  BP 142/87 05/05/19 1355  Temp    Pulse 97 05/05/19 1355  Resp 17 05/05/19 1355  SpO2 99 % 05/05/19 1355  Vitals shown include unvalidated device data.  Last Pain:  Vitals:   05/05/19 1131  TempSrc: Temporal  PainSc: 0-No pain         Complications: No apparent anesthesia complications

## 2019-05-05 NOTE — H&P (Signed)
Terri Tanner [id number] 08-29-1960     HPI: This 58 year old woman was recently diagnosed with invasive mammary carcinoma involving the right breast.  This was a T1b, ER/PR positive, HER-2/neu not overexpressing lesion.  Given her options for management she desired to proceed to simple mastectomy as she did not want to receive postoperative radiation therapy.  This decision was reviewed on at least 2 occasions prior to surgery, once in person and once by phone.    Medications Prior to Admission  Medication Sig Dispense Refill Last Dose  . acetaminophen (TYLENOL) 325 MG tablet Take 325 mg by mouth every 6 (six) hours as needed (for pain.).     Marland Kitchen EPINEPHrine 0.3 mg/0.3 mL IJ SOAJ injection Inject 0.3 mLs (0.3 mg total) into the muscle as needed for anaphylaxis. 2 each 0   . famotidine (PEPCID) 40 MG tablet Take 40 mg by mouth 2 (two) times daily.     . hydrOXYzine (ATARAX/VISTARIL) 25 MG tablet Take 25 mg by mouth 3 (three) times daily.     Marland Kitchen levocetirizine (XYZAL) 5 MG tablet Take 5 mg by mouth at bedtime.      Marland Kitchen loratadine (CLARITIN) 10 MG tablet Take 1 tablet (10 mg total) by mouth 2 (two) times daily. 60 tablet 0   . Multiple Vitamins-Minerals (MULTIVITAMIN PO) Take 1 packet by mouth 2 (two) times daily. Packets include: ProvexCV (1 capsule twice daily), Recover Al (1 tablet twice daily), Florify (1 capsule in evening), CardiOmega EPA (1 softgels twice daily), Vitality Multivitamin & Mineral (1 tablet twice daily), CellWise (1 tablet twice daily)      No Known Allergies Past Medical History:  Diagnosis Date  . Allergy   . Diabetes mellitus type 2 in obese ([place]) 02/06/2018  . Environmental and seasonal allergies   . GERD (gastroesophageal reflux disease)    Past Surgical History:  Procedure Laterality Date  . CERVICAL POLYPECTOMY    . CESAREAN SECTION    . COLONOSCOPY WITH PROPOFOL N/A 03/03/2018   Procedure: COLONOSCOPY WITH PROPOFOL;  Surgeon: Jonathon Bellows, MD;  Location: [facility]  ENDOSCOPY;  Service: Gastroenterology;  Laterality: N/A;  . TRANSANAL EXCISION OF RECTAL MASS N/A 04/14/2018   Procedure: TRANSANAL EXCISION OF RECTAL POLYP;  Surgeon: Jules Husbands, MD;  Location: [facility] ORS;  Service: General;  Laterality: N/A;  . WISDOM TOOTH EXTRACTION    . WISDOM TOOTH EXTRACTION     Social History   Socioeconomic History  . Marital status: Married    Spouse name: Alvester Chou  . Number of children: 1  . Years of education: Not on file  . Highest education level: High school graduate  Occupational History  . Occupation: stay at home   Social Needs  . Financial resource strain: Not hard at all  . Food insecurity    Worry: Never true    Inability: Never true  . Transportation needs    Medical: No    Non-medical: No  Tobacco Use  . Smoking status: Never Smoker  . Smokeless tobacco: Never Used  Substance and Sexual Activity  . Alcohol use: Not Currently    Comment: rarely  . Drug use: Never  . Sexual activity: Not Currently    Partners: Male    Birth control/protection: None, Surgical  Lifestyle  . Physical activity    Days per week: 3 days    Minutes per session: 30 min  . Stress: Not at all  Relationships  . Social connections    Talks on phone: More than  three times a week    Gets together: More than three times a week    Attends religious service: More than 4 times per year    Active member of club or organization: Yes    Attends meetings of clubs or organizations: 1 to 4 times per year    Relationship status: Married  . Intimate partner violence    Fear of current or ex partner: No    Emotionally abused: No    Physically abused: No    Forced sexual activity: No  Other Topics Concern  . Not on file  Social History Narrative   Patient  moved here from [place], [place] 07/2017   They moved in with their only daughter to home school the grandchildren   Social History   Social History Narrative   Patient  moved here from [place], [place]  07/2017   They moved in with their only daughter to home school the grandchildren     ROS: Negative.     PE: HEENT: Negative. Lungs: Clear. Cardio: RR.   Assessment/Plan:  Proceed with planned right simple mastectomy with sentinel node biopsy.     [name] 05/05/2019

## 2019-05-05 NOTE — Discharge Instructions (Signed)

## 2019-05-08 ENCOUNTER — Encounter: Payer: Self-pay | Admitting: General Surgery

## 2019-05-08 NOTE — Anesthesia Postprocedure Evaluation (Signed)
Anesthesia Post Note  Patient: Terri Tanner  Procedure(s) Performed: SIMPLE MASTECTOMY (Right Breast) SENTINEL NODE BIOPSY (Right )  Patient location during evaluation: PACU Anesthesia Type: General Level of consciousness: awake and alert and oriented Pain management: pain level controlled Vital Signs Assessment: post-procedure vital signs reviewed and stable Respiratory status: spontaneous breathing, nonlabored ventilation and respiratory function stable Cardiovascular status: blood pressure returned to baseline and stable Postop Assessment: no signs of nausea or vomiting Anesthetic complications: no     Last Vitals:  Vitals:   05/05/19 1539 05/05/19 1623  BP: 138/81 140/78  Pulse: 97 80  Resp: 20 (!) 22  Temp: (!) 36.1 C   SpO2: 100% 100%    Last Pain:  Vitals:   05/05/19 1539  TempSrc: Temporal  PainSc: 2                  Tahir Blank

## 2019-05-10 LAB — SURGICAL PATHOLOGY

## 2019-05-11 ENCOUNTER — Encounter: Payer: BLUE CROSS/BLUE SHIELD | Admitting: Family Medicine

## 2019-05-24 ENCOUNTER — Ambulatory Visit: Payer: BLUE CROSS/BLUE SHIELD | Admitting: Family Medicine

## 2019-05-30 NOTE — Progress Notes (Signed)
[facility]  [address], [address] [place], [place] [address] Phone: [phone]  Fax: [phone]   Telemedicine Office Visit:  06/01/2019  Referring physician: Steele Sizer, MD  I connected with Darlina Rumpf on 06/01/2019 at 9:10 AM by videoconferencing and verified that I was speaking with the correct person using 2 identifiers.  The patient was at home.  I discussed the limitations, risk, security and privacy concerns of performing an evaluation and management service by videoconferencing and the availability of in person appointments.  I also discussed with the patient that there may be a patient responsible charge related to this service.  The patient expressed understanding and agreed to proceed.   Chief Complaint: Terri Tanner is a 58 y.o. female with stage IA right breast cancer s/p interval mastectomy and sentinel lymph node biopsy for discussion regarding direction of therapy.   HPI: The patient was last seen in the medical oncology clinic on 04/13/2019. At that time, she was doing well.  Exam revealed post biopsy changes in the right breast. CBC and CMP were normal. TSH 2.521. CA 27.29 as 13.9. We discussed plans for surgery. She was considering mastectomy.  She did not want radiation or chemotherapy. Patient wished to pursue endocrine therapy.   She has an extensive family history of malignancy.  Invitae genetic testing was negative.  She underwent a right simple mastectomy and sentinel node biopsy by Dr. Bary Castilla on 05/05/2019.  Pathology revealed a 1.3 x 1.1 x 0.8 cm grade I invasive mammary carcinoma, no special type. Three sentinel lymph nodes were negative.  Tumor was ER positive (>90%), PR positive (40%), and HER2/neu negative.  Pathologic stage was pT1c pN0.  Oncotype DX revealed < 1% benefit of chemotherapy.  During the interim, She has felt "good".  She reports feeling at peace and has comfort with her current state of health. She has  occasional heartburn. She notes hives in the morning. She notes her skin feels tight with movement near surgical site. She notes ecchymosis is certain areas s/p surgery.   She reports reading about the endocrine therapy drugs and she does not like the side effects. She does not want to be back on any statin drug secondary to hives. She would like to use a natural hormone blocker and has an upcoming appointment regarding this issue.    Past Medical History:  Diagnosis Date   Allergy    Diabetes mellitus type 2 in obese ([place]) 02/06/2018   Environmental and seasonal allergies    GERD (gastroesophageal reflux disease)     Past Surgical History:  Procedure Laterality Date   CERVICAL POLYPECTOMY     CESAREAN SECTION     COLONOSCOPY WITH PROPOFOL N/A 03/03/2018   Procedure: COLONOSCOPY WITH PROPOFOL;  Surgeon: Jonathon Bellows, MD;  Location: [facility] ENDOSCOPY;  Service: Gastroenterology;  Laterality: N/A;   SENTINEL NODE BIOPSY Right 05/05/2019   Procedure: SENTINEL NODE BIOPSY;  Surgeon: Robert Bellow, MD;  Location: [facility] ORS;  Service: General;  Laterality: Right;   SIMPLE MASTECTOMY WITH AXILLARY SENTINEL NODE BIOPSY Right 05/05/2019   Procedure: SIMPLE MASTECTOMY;  Surgeon: Robert Bellow, MD;  Location: [facility] ORS;  Service: General;  Laterality: Right;   TRANSANAL EXCISION OF RECTAL MASS N/A 04/14/2018   Procedure: TRANSANAL EXCISION OF RECTAL POLYP;  Surgeon: Jules Husbands, MD;  Location: [facility] ORS;  Service: General;  Laterality: N/A;   WISDOM TOOTH EXTRACTION     WISDOM TOOTH EXTRACTION  Family History  Problem Relation Age of Onset   Lung cancer Mother        smoked for many years   COPD Mother    Emphysema Mother    Esophageal cancer Father        smoker   Suicidality Father    Cancer Sister    Stroke Brother    Alcohol abuse Brother    Other Brother        huge smoker   Diabetes Sister    Breast cancer Neg Hx     Social History:  reports  that she has never smoked. She has never used smokeless tobacco. She reports previous alcohol use. She reports that she does not use drugs. She denies any exposure to radiation or toxins. She lives with her daughter and granddaughter in [place]. She moved in July. 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 The patient is accompanied by her husband today.  Participants in the patient's visit and their role in the encounter included the patient, her husband, grandson, and Vito Berger, CMA, today.  The intake visit was provided by Vito Berger, CMA.  Allergies: No Known Allergies  Current Medications: Current Outpatient Medications  Medication Sig Dispense Refill   famotidine (PEPCID) 40 MG tablet Take 40 mg by mouth 2 (two) times daily.     hydrOXYzine (ATARAX/VISTARIL) 25 MG tablet Take 25 mg by mouth 3 (three) times daily.     levocetirizine (XYZAL) 5 MG tablet Take 5 mg by mouth at bedtime.      loratadine (CLARITIN) 10 MG tablet Take 1 tablet (10 mg total) by mouth 2 (two) times daily. 60 tablet 0   Multiple Vitamins-Minerals (MULTIVITAMIN PO) Take 1 packet by mouth 2 (two) times daily. Packets include: ProvexCV (1 capsule twice daily), Recover Al (1 tablet twice daily), Florify (1 capsule in evening), CardiOmega EPA (1 softgels twice daily), Vitality Multivitamin & Mineral (1 tablet twice daily), CellWise (1 tablet twice daily)     acetaminophen (TYLENOL) 325 MG tablet Take 325 mg by mouth every 6 (six) hours as needed (for pain.).     EPINEPHrine 0.3 mg/0.3 mL IJ SOAJ injection Inject 0.3 mLs (0.3 mg total) into the muscle as needed for anaphylaxis. (Patient not taking: Reported on 05/31/2019) 2 each 0   HYDROcodone-acetaminophen (NORCO/VICODIN) 5-325 MG tablet Take 1 tablet by mouth every 4 (four) hours as needed for moderate pain. (Patient not taking: Reported on 05/31/2019) 15 tablet 0   No current facility-administered medications for this visit.     Review of Systems    Constitutional: Negative.  Negative for chills, diaphoresis, fever, malaise/fatigue and weight loss.       Feels "good".  Feeling at "peace and has comfort with current state of health".  HENT: Negative.  Negative for congestion, ear discharge, ear pain, hearing loss, nosebleeds, sinus pain and sore throat.   Eyes: Negative.  Negative for blurred vision, double vision and photophobia.  Respiratory: Negative for cough, hemoptysis, sputum production, shortness of breath and wheezing.   Cardiovascular: Negative.  Negative for chest pain, palpitations, orthopnea, leg swelling and PND.  Gastrointestinal: Positive for heartburn (on and off, on pepcide). Negative for abdominal pain, blood in stool, constipation, diarrhea, melena, nausea and vomiting.  Genitourinary: Negative.  Negative for dysuria, frequency, hematuria and urgency.  Musculoskeletal: Negative.  Negative for back pain, joint pain, myalgias and neck pain.  Skin: Negative for itching and rash.       Hives every morning. Skin feels tight near surgical  site.  Neurological: Negative.  Negative for dizziness, tingling, sensory change, speech change, focal weakness, weakness and headaches.  Endo/Heme/Allergies: Bruises/bleeds easily (ecchymosis in certain areas s/p mastectomy).  Psychiatric/Behavioral: Negative for depression and memory loss. The patient is not nervous/anxious and does not have insomnia.   All other systems reviewed and are negative.   Performance status (ECOG): 1  Physical Exam  Constitutional: She is oriented to person, place, and time. She appears well-developed and well-nourished. No distress.  HENT:  Head: Normocephalic and atraumatic.  Shoulder length dark brown hair.  Eyes: Conjunctivae and EOM are normal.  Glasses.  Brown eyes.  Neurological: She is alert and oriented to person, place, and time.  Skin: She is not diaphoretic.  Psychiatric: She has a normal mood and affect. Her behavior is normal. Judgment and  thought content normal.  Nursing note reviewed.   No visits with results within 3 Day(s) from this visit.  Latest known visit with results is:  Admission on 05/05/2019, Discharged on 05/05/2019  Component Date Value Ref Range Status   Glucose-Capillary 05/05/2019 105* 70 - 99 mg/dL Final   SURGICAL PATHOLOGY 05/05/2019    Final-Edited                   Value:SURGICAL PATHOLOGY CASE: [phone] PATIENT: Vira Agar Speyer Surgical Pathology Report  Specimen Submitted: A. Breast, right B. Sentinal node, 1, 2, and 3  Clinical History: Right breast cancer  DIAGNOSIS: A. RIGHT BREAST, TOTAL MASTECTOMY: - INVASIVE MAMMARY CARCINOMA, NO SPECIAL TYPE. - FOUR LYMPH NODES, NEGATIVE FOR METASTATIC CARCINOMA. - REFER TO CANCER SUMMARY BELOW.  B. SENTINEL LYMPH NODES 1, 2, AND 3; EXCISIONAL BIOPSY: - THREE LYMPH NODES, NEGATIVE FOR METASTATIC CARCINOMA.  CANCER CASE SUMMARY: INVASIVE CARCINOMA OF THE BREAST Procedure: Simple mastectomy with sentinel node biopsy Specimen Laterality: Right Tumor Size: 13 x 11 x 8 mm Histologic Type: Invasive mammary carcinoma, no special type Histologic Grade (Nottingham Histologic Score)                      Glandular (Acinar)/Tubular Differentiation: 2                      Nuclear Pleomorphism: 2                      Mitotic Rate: 1                      Overall Grade: 1 Ductal                          Carcinoma In Situ (DCIS): Not identified Margins:                      Invasive Carcinoma Margins: Uninvolved by invasive carcinoma                      Distance from closest margin: 20 mm                      Specify closest margin: Superficial/skin                      DCIS Margins: Not applicable Regional Lymph Nodes: Uninvolved by tumor cells                      Total Number of Lymph  Nodes Examined: 7                      Number of Sentinel Nodes Examined: 3  Treatment Effect in the Breast: Not applicable Lymphovascular Invasion: Not  identified Pathologic Stage Classification (pTNM, AJCC 8th Edition): pT1c pN0 TNM Descriptors: Not applicable Breast Biomarker Testing Performed on Previous Biopsy: ARS 20-5094 Estrogen Receptor (ER) Status: POSITIVE, >90% Progesterone Receptor (PgR) Status: POSITIVE, 40% HER2 (by immunohistochemistry): NEGATIVE (Score 0)  GROSS DESCRIPTION: A. Labeled: Right breast Received: Fresh Time in fixative: Tissue procedure time 1:25 PM                         , tissue put in formalin time 1:31 PM Cold ischemic time: 6 minutes Total fixation time: 72 Type of mastectomy: Simple mastectomy Laterality: Right site Weight of specimen: 922 grams Size of specimen: 20.0 cm from medial to lateral, 18.0 cm from superior to inferior, 9.5 cm from anterior to posterior Orientation of specimen: Axillary tail with lymph nodes Inking scheme: Superior blue, inferior green, deep black Skin ellipse dimensions  description: Grossly unremarkable white skin ellipse 20 x 13 cm Nipple/ areola: Centrally located areola (measuring 5.0 cm in diameter) and nipple (measuring 1.5 x 1.5 x 0.7 cm) Axillary tail: 6 x 5 x 3 cm Fascia: Smooth with multiple small skeletal muscle strips Biopsy site(s): Identified in upper outer quadrant at the 11 o'clock position and 4 cm from nipple, coil shaped biopsy clip Number of discrete masses: 1 Location of mass(es): Upper outer quadrant at 11 o'clock position and 4 cm from nipple  Size of mass(es)/biopsy site(s):                          1.3 x 1.1 x 0.8 cm Description of mass(es)/biopsy site(s): Solid white firm tumor  Margins: 3.2 cm from deep surgical margin, 2.0 cm from skin, 4.0 cm from superior surgical margin, 6.0 cm from inferior surgical margin, 12.0 cm from lateral surgical margin, 8 cm from medial surgical margin.  Gross involvement of skin/fascia/muscle by tumor: Not present Description of remaining breast: Diffuse fibrotic changes mostly in the center of the  breast. Lymph nodes: 4 lymph nodes ranging from 0.9 to 3.0 cm in greatest dimension  Block Summary: 1-trisected nipple 2-nipple base 3-upper outer quadrant closest to the tumor deep surgical margin 4-5-largest tumor dimension 6-13-upper outer quadrant tissue adjacent to the tumor 14-22-blue dye retroareolar and central breast with closes deep surgical margin 23-24-upper outer quadrant representative sections 25-26 lower inner quadrant representative sections 27-28-lower outer quadrant representative sections 29-32- one lymph nod                         e 33-34-one lymph node 35-36-one lymph node 37-one lymph node  B. Labeled: Sentinel node #1, #2 and #3 Received: In formalin Tissue fragment(s): 3 Size: 1.0 x 0.8 x 0.5 cm, 0.9 x 0.8 x 0.5 cm and 0.8 x 0.7 x 0.5 cm Description: three fatty soft lymph nodes Entirely submitted in 6 cassettes as follow: 1-2- one lymph node 3-4- one lymph node 5-6- one lymph node  Final Diagnosis performed by Betsy Pries, MD.   Electronically signed 05/10/2019 2:57:42PM The electronic signature indicates that the named Attending Pathologist has evaluated the specimen Technical component performed at [facility], [address] [place], [place] [address] Lab: [phone] Dir: Rush Farmer, MD, MMM  Professional component  performed at [facility], [address], [place], [place], [place] [address] Lab: [phone] Dir: Dellia Nims. Rubinas, MD    Assessment:  CECI TALIAFERRO is a 58 y.o. female with stage IA right breast cancer s/p right simple mastectomy and sentinel node biopsy on 05/05/2019.  Pathology revealed a 1.3 x 1.1 x 0.8 cm grade I invasive mammary carcinoma, no special type. Three sentinel lymph nodes were negative.  Tumor was ER positive (>90%). PR positive (40%) and HER2/neu negative.  Pathologic stage was pT1c pN0.  Oncotype DX revealed < 1% benefit of chemotherapy.  Bilateral diagnostic mammogram on 03/24/2019  revealed a suspicious 1.1 cm mass in the right breast at the 11 o'clock position and a 0.5 cm oval circumscribed mass in the upper outer left breast. Targeted ultrasound of the right breast revealed an irregular 1.3 x 0.8 x 1.1 cm hypoechoic mass at 11 o'clock 4 cm from the nipple.  Lymph nodes were unremarkable.  Targeted ultrasound of the left breast revealed a cluster of microcysts at 2 o'clock 6 cm from nipple measuring 0.7 x 0.2 x 0.7 cm.   She has a family history of breast cancer, ovarian cancer, endometrial cancer, brain tumors, and smoking related cancers (lung and head and neck).  [employer]ae genetic testing on 04/13/2019 was negative.  She has a history of chronic urticariasince 12/2018 and mild leukocytosissince 02/09/2019. WBChas ranged between 11,100 and 13,600. Differential has been predominantly neutrophils. Etiology is felt reactive. CRP has been elevated.  Work-up on 09/02/2020revealed a hematocrit of 40.8, hemoglobin 13.9, MCV 87.4, platelets 296,000, white count 12,700 with an March ARB of10,300. Differentialincluded 80% segs, 13% lymphs, 5% monocytes, and1% eosinophils.ANA was negative.Tryptasewas 8.5 (2.2-13.2). SPEP revealed no monoclonal protein. IgG was 621, IgA 141,IgM 115, andIgE4 (low).  Peripheralsmearrevealed mild leukocytosis with neutrophilia and minimal left shifted maturation. There is no significant morphologic abnormalities of leukocytes. There was unremarkable RBC morphology and indices. There was normal platelet count and morphology.   She has chronic urticariaand intermittent angioedema of her lips. Etiology does not appear related to any medications, herbal products, foods, inset bites, infections, or NSAIDs. She has no apparent autoimmune disease.  Symptomatically, she is doing well.  She tolerated surgery well.  Plan: 1.   Clinical stage IA right breast cancer             Patient is s/p right mastectomy and SLNC on 05/05/2019.               Review pathology in detail.             Review Oncotype DX testing- < 1% benefit of chemotherapy.  Discuss plan for endocrine therapy (tamoxifen vs AI).              Potential side effects reviewed.   Information provided on letrozole.   Review plan for baseline bone density.  Patient has appointment with another physician to discuss "natural hormone blockers". 2.Chronic urticaria Etiologyremainsunclear. There were no inciting events. She has no evidence of a myeloproliferative disorder, paraprotein or mastocytosis. Allergy testing was normal.             Continue to monitor. 3.   Weight loss             Patient notes weight loss of 20 pounds in the past year.  TSH was 2.521 (normal) on 04/13/2019. 4.   Bone density on 06/08/2019. 5.   RTC next week for labs (CBC with diff, CMP). 6.   RN  to call patient on 06/21/2019 regarding patient's decision re: endocrine therapy. 7.   RTC at end of 06/2019 for MD assessment and labs (LFTs).  I discussed the assessment and treatment plan with the patient.  The patient was provided an opportunity to ask questions and all were answered.  The patient agreed with the plan and demonstrated an understanding of the instructions.  The patient was advised to call back or seek an in person evaluation if the symptoms worsen or if the condition fails to improve as anticipated.  I provided 17 minutes (9:10 AM - 9:27 AM) of face-to-face video visit time during this this encounter and > 50% was spent counseling as documented under my assessment and plan.  I provided these services from the [facility] office.   Nolon Stalls, MD, PhD  06/01/2019, 9:10 AM  I, Selena Batten, am acting as scribe for [employer]. Mike Gip, MD, PhD.   I, Melissa C. Mike Gip, MD, have reviewed the above documentation for accuracy and completeness, and I agree with the above.

## 2019-05-31 ENCOUNTER — Encounter: Payer: Self-pay | Admitting: Hematology and Oncology

## 2019-05-31 NOTE — Progress Notes (Signed)
No new changes noted today. The patient name and DOB has been verified by phone. 

## 2019-06-01 ENCOUNTER — Other Ambulatory Visit: Payer: Self-pay | Admitting: Specialist

## 2019-06-01 ENCOUNTER — Inpatient Hospital Stay: Payer: BLUE CROSS/BLUE SHIELD | Attending: Hematology and Oncology | Admitting: Hematology and Oncology

## 2019-06-01 ENCOUNTER — Encounter: Payer: Self-pay | Admitting: General Surgery

## 2019-06-01 ENCOUNTER — Encounter: Payer: Self-pay | Admitting: Hematology and Oncology

## 2019-06-01 DIAGNOSIS — Z170 Estrogen receptor positive status [ER+]: Secondary | ICD-10-CM | POA: Diagnosis not present

## 2019-06-01 DIAGNOSIS — C50411 Malignant neoplasm of upper-outer quadrant of right female breast: Secondary | ICD-10-CM | POA: Insufficient documentation

## 2019-06-01 NOTE — Patient Instructions (Signed)
Letrozole tablets What is this medicine? LETROZOLE (LET roe zole) blocks the production of estrogen. It is used to treat breast cancer. This medicine may be used for other purposes; ask your health care provider or pharmacist if you have questions. COMMON BRAND NAME(S): Femara What should I tell my health care provider before I take this medicine? They need to know if you have any of these conditions:  high cholesterol  liver disease  osteoporosis (weak bones)  an unusual or allergic reaction to letrozole, other medicines, foods, dyes, or preservatives  pregnant or trying to get pregnant  breast-feeding How should I use this medicine? Take this medicine by mouth with a glass of water. You may take it with or without food. Follow the directions on the prescription label. Take your medicine at regular intervals. Do not take your medicine more often than directed. Do not stop taking except on your doctor's advice. Talk to your pediatrician regarding the use of this medicine in children. Special care may be needed. Overdosage: If you think you have taken too much of this medicine contact a poison control center or emergency room at once. NOTE: This medicine is only for you. Do not share this medicine with others. What if I miss a dose? If you miss a dose, take it as soon as you can. If it is almost time for your next dose, take only that dose. Do not take double or extra doses. What may interact with this medicine? Do not take this medicine with any of the following medications:  estrogens, like hormone replacement therapy or birth control pills This medicine may also interact with the following medications:  dietary supplements such as androstenedione or DHEA  prasterone  tamoxifen This list may not describe all possible interactions. Give your health care provider a list of all the medicines, herbs, non-prescription drugs, or dietary supplements you use. Also tell them if you  smoke, drink alcohol, or use illegal drugs. Some items may interact with your medicine. What should I watch for while using this medicine? Tell your doctor or healthcare professional if your symptoms do not start to get better or if they get worse. Do not become pregnant while taking this medicine or for 3 weeks after stopping it. Women should inform their doctor if they wish to become pregnant or think they might be pregnant. There is a potential for serious side effects to an unborn child. Talk to your health care professional or pharmacist for more information. Do not breast-feed while taking this medicine or for 3 weeks after stopping it. This medicine may interfere with the ability to have a child. Talk with your doctor or health care professional if you are concerned about your fertility. Using this medicine for a long time may increase your risk of low bone mass. Talk to your doctor about bone health. You may get drowsy or dizzy. Do not drive, use machinery, or do anything that needs mental alertness until you know how this medicine affects you. Do not stand or sit up quickly, especially if you are an older patient. This reduces the risk of dizzy or fainting spells. You may need blood work done while you are taking this medicine. What side effects may I notice from receiving this medicine? Side effects that you should report to your doctor or health care professional as soon as possible:  allergic reactions like skin rash, itching, or hives  bone fracture  chest pain  signs and symptoms of a blood clot  such as breathing problems; changes in vision; chest pain; severe, sudden headache; pain, swelling, warmth in the leg; trouble speaking; sudden numbness or weakness of the face, arm or leg  vaginal bleeding Side effects that usually do not require medical attention (report to your doctor or health care professional if they continue or are bothersome):  bone, back, joint, or muscle  pain  dizziness  fatigue  fluid retention  headache  hot flashes, night sweats  nausea  weight gain This list may not describe all possible side effects. Call your doctor for medical advice about side effects. You may report side effects to FDA at 1-800-FDA-1088. Where should I keep my medicine? Keep out of the reach of children. Store between 15 and 30 degrees C (59 and 86 degrees F). Throw away any unused medicine after the expiration date. NOTE: This sheet is a summary. It may not cover all possible information. If you have questions about this medicine, talk to your doctor, pharmacist, or health care provider.  2020 Elsevier/Gold Standard (2016-01-13 11:10:41)  

## 2019-06-02 ENCOUNTER — Other Ambulatory Visit: Payer: Self-pay

## 2019-06-02 ENCOUNTER — Inpatient Hospital Stay: Payer: BLUE CROSS/BLUE SHIELD

## 2019-06-02 DIAGNOSIS — C50411 Malignant neoplasm of upper-outer quadrant of right female breast: Secondary | ICD-10-CM | POA: Diagnosis present

## 2019-06-02 DIAGNOSIS — Z170 Estrogen receptor positive status [ER+]: Secondary | ICD-10-CM

## 2019-06-02 LAB — CBC WITH DIFFERENTIAL/PLATELET
Abs Immature Granulocytes: 0.04 10*3/uL (ref 0.00–0.07)
Basophils Absolute: 0 10*3/uL (ref 0.0–0.1)
Basophils Relative: 0 %
Eosinophils Absolute: 0.3 10*3/uL (ref 0.0–0.5)
Eosinophils Relative: 4 %
HCT: 40.6 % (ref 36.0–46.0)
Hemoglobin: 13.7 g/dL (ref 12.0–15.0)
Immature Granulocytes: 1 %
Lymphocytes Relative: 28 %
Lymphs Abs: 2.1 10*3/uL (ref 0.7–4.0)
MCH: 29.2 pg (ref 26.0–34.0)
MCHC: 33.7 g/dL (ref 30.0–36.0)
MCV: 86.6 fL (ref 80.0–100.0)
Monocytes Absolute: 0.6 10*3/uL (ref 0.1–1.0)
Monocytes Relative: 8 %
Neutro Abs: 4.5 10*3/uL (ref 1.7–7.7)
Neutrophils Relative %: 59 %
Platelets: 233 10*3/uL (ref 150–400)
RBC: 4.69 MIL/uL (ref 3.87–5.11)
RDW: 13.1 % (ref 11.5–15.5)
WBC: 7.5 10*3/uL (ref 4.0–10.5)
nRBC: 0 % (ref 0.0–0.2)

## 2019-06-02 LAB — COMPREHENSIVE METABOLIC PANEL
ALT: 23 U/L (ref 0–44)
AST: 18 U/L (ref 15–41)
Albumin: 4.1 g/dL (ref 3.5–5.0)
Alkaline Phosphatase: 77 U/L (ref 38–126)
Anion gap: 8 (ref 5–15)
BUN: 14 mg/dL (ref 6–20)
CO2: 28 mmol/L (ref 22–32)
Calcium: 9.5 mg/dL (ref 8.9–10.3)
Chloride: 103 mmol/L (ref 98–111)
Creatinine, Ser: 0.95 mg/dL (ref 0.44–1.00)
GFR calc Af Amer: >60 mL/min (ref >60)
GFR calc non Af Amer: >60 mL/min (ref >60)
Glucose, Bld: 91 mg/dL (ref 70–99)
Potassium: 4.1 mmol/L (ref 3.5–5.1)
Sodium: 139 mmol/L (ref 135–145)
Total Bilirubin: 0.7 mg/dL (ref 0.3–1.2)
Total Protein: 7.1 g/dL (ref 6.5–8.1)

## 2019-06-08 ENCOUNTER — Ambulatory Visit
Admission: RE | Admit: 2019-06-08 | Discharge: 2019-06-08 | Disposition: A | Discharge status: Home / Self Care | Payer: BLUE CROSS/BLUE SHIELD | Source: Ambulatory Visit | Attending: Hematology and Oncology | Admitting: Hematology and Oncology

## 2019-06-08 DIAGNOSIS — C50411 Malignant neoplasm of upper-outer quadrant of right female breast: Secondary | ICD-10-CM | POA: Insufficient documentation

## 2019-06-08 DIAGNOSIS — Z170 Estrogen receptor positive status [ER+]: Secondary | ICD-10-CM

## 2019-07-17 ENCOUNTER — Ambulatory Visit: Payer: BLUE CROSS/BLUE SHIELD | Admitting: Hematology and Oncology

## 2019-07-17 ENCOUNTER — Other Ambulatory Visit: Payer: BLUE CROSS/BLUE SHIELD

## 2019-08-04 ENCOUNTER — Ambulatory Visit: Payer: BLUE CROSS/BLUE SHIELD | Admitting: Family Medicine

## 2019-08-20 NOTE — Progress Notes (Signed)
[facility]  [address] [place], [place] [address] Phone: [phone]  Fax: [phone]   Clinic Day:  08/22/2019  Referring physician: Steele Sizer, MD  Chief Complaint: Terri Tanner is a 59 y.o. female with stage IA right breast cancer s/p interval mastectomy and sentinel lymph node biopsy who is seen for a 3 month assessment.  HPI: The patient was last seen in the medical oncology clinic on 06/01/2019 via telemedicine. At that time, she was doing well. She tolerated surgery well.   Oncotype DX testing revealed a < 1% benefit of chemotherapy.  We discussed endocrine therapy (tamoxifen vs AI).  She had an appointment with another physician to discuss "natural hormone blockers".  Baseline bone density was discussed.  Labs on 06/02/2019: Hematocrit 40.6, hemoglobin 13.7, platelets 233,000, WBC 7,500. CMP was normal.   Bone density on 06/08/2019 revealed osteopenia with a T-score of -1.7 in the AP spine L1-L4.   During the interim, she has felt "great". [name] came up with a natural hormone blocker regimen. She is taking these new mediations throughout the day after breakfast, lunch and dinner.  She was originally taking 40 supplements.  She has opted out of endocrine therapy. She is active a goes walking when the weather is nice.   Since starting the supplements, she has not had reflux for 2 weeks. She continues to have hives. Every morning the back of her head is itchy.   She will see Dr. Terri Tanner in 10/2019. She does not perform monthly breast exams.    Past Medical History:  Diagnosis Date   Allergy    Diabetes mellitus type 2 in obese ([place]) 02/06/2018   Environmental and seasonal allergies    GERD (gastroesophageal reflux disease)     Past Surgical History:  Procedure Laterality Date   CERVICAL POLYPECTOMY     CESAREAN SECTION     COLONOSCOPY WITH PROPOFOL N/A 03/03/2018   Procedure: COLONOSCOPY WITH  PROPOFOL;  Surgeon: Jonathon Bellows, MD;  Location: [facility] ENDOSCOPY;  Service: Gastroenterology;  Laterality: N/A;   SENTINEL NODE BIOPSY Right 05/05/2019   Procedure: SENTINEL NODE BIOPSY;  Surgeon: Robert Bellow, MD;  Location: [facility] ORS;  Service: General;  Laterality: Right;   SIMPLE MASTECTOMY WITH AXILLARY SENTINEL NODE BIOPSY Right 05/05/2019   Procedure: SIMPLE MASTECTOMY;  Surgeon: Robert Bellow, MD;  Location: [facility] ORS;  Service: General;  Laterality: Right;   TRANSANAL EXCISION OF RECTAL MASS N/A 04/14/2018   Procedure: TRANSANAL EXCISION OF RECTAL POLYP;  Surgeon: Jules Husbands, MD;  Location: [facility] ORS;  Service: General;  Laterality: N/A;   WISDOM TOOTH EXTRACTION     WISDOM TOOTH EXTRACTION      Family History  Problem Relation Age of Onset   Lung cancer Mother        smoked for many years   COPD Mother    Emphysema Mother    Esophageal cancer Father        smoker   Suicidality Father    Cancer Sister    Stroke Brother    Alcohol abuse Brother    Other Brother        huge smoker   Diabetes Sister    Breast cancer Neg Hx     Social History:  reports that she has never smoked. She has never used smokeless tobacco. She reports previous alcohol use. She reports that she does not use drugs. She denies any exposure to radiation or toxins.  She lives with her daughter and granddaughter in [place]. She moved in July. 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 The patient is alone today.  Allergies: No Known Allergies  Current Medications: Current Outpatient Medications  Medication Sig Dispense Refill   Acetylcysteine (NAC PO) Take 2,400 mg/day by mouth.     Ascorbic Acid (VITAMIN C) 1000 MG tablet Take 1,000 mg by mouth daily. 4000 mg/day     Cholecalciferol (VITAMIN D3 PO) Take 7,500 Units/day by mouth.     famotidine (PEPCID) 40 MG tablet Take 40 mg by mouth 2 (two) times daily.     hydrOXYzine (ATARAX/VISTARIL) 25 MG tablet Take 25 mg by mouth 3  (three) times daily.     levocetirizine (XYZAL) 5 MG tablet Take 5 mg by mouth at bedtime.      Linoleic Acid-Sunflower Oil (CLA PO) Take 2,400 mg/day by mouth.     MAGNESIUM MALATE PO Take by mouth. nightly     MELATONIN PO Take 20 mg by mouth. nightly     Menaquinone-7 (VITAMIN K2 PO) Take 150 mcg/day by mouth.     Methylsulfonylmethane (MSM PO) Take 4,000 mg/day by mouth.     Multiple Vitamin (MULTIVITAMIN) tablet Take 1 tablet by mouth daily.     NON FORMULARY Liposomal glutathione 260m/day     Omega-3 Fatty Acids (SUPER OMEGA 3 EPA/DHA PO) Take by mouth.     Turmeric (QC TUMERIC COMPLEX) 500 MG CAPS Take by mouth.     vitamin E (VITAMIN E) 180 MG (400 UNITS) capsule Take 400 Units by mouth daily.     acetaminophen (TYLENOL) 325 MG tablet Take 325 mg by mouth every 6 (six) hours as needed (for pain.).     EPINEPHrine 0.3 mg/0.3 mL IJ SOAJ injection Inject 0.3 mLs (0.3 mg total) into the muscle as needed for anaphylaxis. (Patient not taking: Reported on 05/31/2019) 2 each 0   HYDROcodone-acetaminophen (NORCO/VICODIN) 5-325 MG tablet Take 1 tablet by mouth every 4 (four) hours as needed for moderate pain. (Patient not taking: Reported on 05/31/2019) 15 tablet 0   loratadine (CLARITIN) 10 MG tablet Take 1 tablet (10 mg total) by mouth 2 (two) times daily. (Patient not taking: Reported on 08/22/2019) 60 tablet 0   No current facility-administered medications for this visit.    Review of Systems  Constitutional: Negative.  Negative for chills, diaphoresis, fever, malaise/fatigue and weight loss (up 6 lbs since 04/13/2019).       Feels "great".  Active.  HENT: Negative.  Negative for congestion, ear discharge, ear pain, hearing loss, nosebleeds, sinus pain and sore throat.   Eyes: Negative.  Negative for blurred vision, double vision and photophobia.  Respiratory: Negative.  Negative for cough, hemoptysis, sputum production, shortness of breath and wheezing.   Cardiovascular:  Negative.  Negative for chest pain, palpitations, orthopnea, leg swelling and PND.  Gastrointestinal: Negative.  Negative for abdominal pain, blood in stool, constipation, diarrhea, heartburn (resolved x 2 weeks s/p starting natural supplements), melena, nausea and vomiting.  Genitourinary: Negative.  Negative for dysuria, frequency, hematuria and urgency.  Musculoskeletal: Negative.  Negative for back pain, joint pain, myalgias and neck pain.  Skin: Positive for itching (back of the head; related to hives). Negative for rash.       Hives every morning.  Neurological: Negative for dizziness, tingling, sensory change, speech change, focal weakness, loss of consciousness, weakness and headaches.  Endo/Heme/Allergies: Negative.  Does not bruise/bleed easily.  Psychiatric/Behavioral: Negative.  Negative for depression and memory loss. The patient  is not nervous/anxious and does not have insomnia.   All other systems reviewed and are negative.  Performance status (ECOG): 1  Vitals Blood pressure 117/76, pulse 83, temperature (!) 97.4 F (36.3 C), temperature source Tympanic, resp. rate 18, weight 160 lb 15 oz (73 kg), SpO2 100 %.   Physical Exam  Constitutional: She is oriented to person, place, and time. She appears well-developed and well-nourished. No distress.  HENT:  Head: Normocephalic and atraumatic.  Mouth/Throat: Oropharynx is clear and moist. No oropharyngeal exudate.  Long dark brown hair.  Eyes: Pupils are equal, round, and reactive to light. Conjunctivae and EOM are normal. No scleral icterus.  Glasses.  Brown eyes.  Neck: No JVD present.  Cardiovascular: Normal rate, regular rhythm and normal heart sounds.  No murmur heard. Pulmonary/Chest: Effort normal and breath sounds normal. No respiratory distress. She has no wheezes. She has no rales. She exhibits no tenderness. Left breast exhibits no inverted nipple, no mass, no nipple discharge, no skin change and no tenderness. Breasts  are symmetrical (Right mastectomy without erythema or nodularity).  Slight RIGHT lateral chest wall hyperpigmentation.  Abdominal: Soft. Bowel sounds are normal. She exhibits no distension and no mass. There is no abdominal tenderness. There is no rebound and no guarding.  Musculoskeletal:        General: No tenderness or edema. Normal range of motion.     Cervical back: Normal range of motion and neck supple.  Lymphadenopathy:       Head (right side): No preauricular, no posterior auricular and no occipital adenopathy present.       Head (left side): No preauricular, no posterior auricular and no occipital adenopathy present.    She has no cervical adenopathy.    She has no axillary adenopathy.       Right: No inguinal and no supraclavicular adenopathy present.       Left: No inguinal and no supraclavicular adenopathy present.  Neurological: She is alert and oriented to person, place, and time.  Skin: Skin is warm and dry. She is not diaphoretic. No pallor.  Psychiatric: She has a normal mood and affect. Her behavior is normal. Judgment and thought content normal.  Nursing note and vitals reviewed.   Appointment on 08/22/2019  Component Date Value Ref Range Status   WBC 08/22/2019 5.1  4.0 - 10.5 K/uL Final   RBC 08/22/2019 4.84  3.87 - 5.11 MIL/uL Final   Hemoglobin 08/22/2019 14.3  12.0 - 15.0 g/dL Final   HCT 08/22/2019 41.3  36.0 - 46.0 % Final   MCV 08/22/2019 85.3  80.0 - 100.0 fL Final   MCH 08/22/2019 29.5  26.0 - 34.0 pg Final   MCHC 08/22/2019 34.6  30.0 - 36.0 g/dL Final   RDW 08/22/2019 11.9  11.5 - 15.5 % Final   Platelets 08/22/2019 205  150 - 400 K/uL Final   nRBC 08/22/2019 0.0  0.0 - 0.2 % Final   Neutrophils Relative % 08/22/2019 50  % Final   Neutro Abs 08/22/2019 2.5  1.7 - 7.7 K/uL Final   Lymphocytes Relative 08/22/2019 37  % Final   Lymphs Abs 08/22/2019 1.9  0.7 - 4.0 K/uL Final   Monocytes Relative 08/22/2019 9  % Final   Monocytes  Absolute 08/22/2019 0.5  0.1 - 1.0 K/uL Final   Eosinophils Relative 08/22/2019 3  % Final   Eosinophils Absolute 08/22/2019 0.2  0.0 - 0.5 K/uL Final   Basophils Relative 08/22/2019 1  % Final  Basophils Absolute 08/22/2019 0.0  0.0 - 0.1 K/uL Final   Immature Granulocytes 08/22/2019 0  % Final   Abs Immature Granulocytes 08/22/2019 0.02  0.00 - 0.07 K/uL Final   Performed at [facility], [address] [place], [place] [address]   Sodium 08/22/2019 135  135 - 145 mmol/L Final   Potassium 08/22/2019 4.0  3.5 - 5.1 mmol/L Final   Chloride 08/22/2019 102  98 - 111 mmol/L Final   CO2 08/22/2019 22  22 - 32 mmol/L Final   Glucose, Bld 08/22/2019 122* 70 - 99 mg/dL Final   Glucose reference range applies only to samples taken after fasting for at least 8 hours.   BUN 08/22/2019 11  6 - 20 mg/dL Final   Creatinine, Ser 08/22/2019 0.59  0.44 - 1.00 mg/dL Final   Calcium 08/22/2019 9.4  8.9 - 10.3 mg/dL Final   Total Protein 08/22/2019 7.0  6.5 - 8.1 g/dL Final   Albumin 08/22/2019 4.2  3.5 - 5.0 g/dL Final   AST 08/22/2019 26  15 - 41 U/L Final   ALT 08/22/2019 25  0 - 44 U/L Final   Alkaline Phosphatase 08/22/2019 83  38 - 126 U/L Final   Total Bilirubin 08/22/2019 0.8  0.3 - 1.2 mg/dL Final   GFR calc non Af Amer 08/22/2019 >60  >60 mL/min Final   GFR calc Af Amer 08/22/2019 >60  >60 mL/min Final   Anion gap 08/22/2019 11  5 - 15 Final   Performed at [facility], [address] [place], [place] [address]    Assessment:  Terri Tanner is a 59 y.o. female withstage IA right breast cancers/p right simple mastectomy and sentinel node biopsy on 05/05/2019.  Pathology revealed a 1.3 x 1.1 x 0.8 cm grade I invasive mammary carcinoma, no special type. Three sentinel lymph nodes were negative.  Tumor was ER positive (>90%). PR positive (40%) and HER2/neu negative.  Pathologic stage was pT1c pN0.  Oncotype DX revealed < 1% benefit of  chemotherapy.  Bilateraldiagnosticmammogramon 03/24/2019 revealed a suspicious 1.1 cmmass in the right breastat the 11 o'clock position and a 0.5 cm oval circumscribed mass in the upper outer left breast.Targeted ultrasoundof the right breast revealed an irregular 1.3 x 0.8 x 1.1 cm hypoechoic mass at 11 o'clock 4 cm from the nipple. Lymph nodes were unremarkable. Targeted ultrasound of the left breast revealed a cluster of microcysts at 2 o'clock 6 cm from nipple measuring 0.7 x 0.2 x 0.7 cm.  Bone density on 06/08/2019 revealed osteopenia with a T-score of -1.7 in the AP spine L1-L4.   She has a family historyof breast cancer, ovarian cancer, endometrial cancer, brain tumors, and smoking related cancers (lung and head and neck).  [facility] genetic testing on 04/13/2019 was negative.  She has a history ofchronic urticariasince 12/2018 and mild leukocytosissince 02/09/2019. WBChas ranged between 11,100 and 13,600. Differential has been predominantly neutrophils. Etiology is felt reactive. CRP has been elevated.  Work-up on 09/02/2020revealed a hematocrit of 40.8, hemoglobin 13.9, MCV 87.4, platelets 296,000, white count 12,700 with an [place] of10,300. Differentialincluded 80% segs, 13% lymphs, 5% monocytes, and1% eosinophils.ANA was negative.Tryptasewas 8.5 (2.2-13.2). SPEP revealed no monoclonal protein. IgG was 621, IgA 141,IgM 115, andIgE4 (low).  Peripheralsmearrevealed mild leukocytosis with neutrophilia and minimal left shifted maturation. There is no significant morphologic abnormalities of leukocytes. There was unremarkable RBC morphology and indices. There was normal platelet count and morphology.   She has chronic urticariaand intermittent  angioedema of her lips. Etiology does not appear related to any medications, herbal products, foods, inset bites, infections, or NSAIDs. She has no apparent autoimmune disease.  Symptomatically, she is doing  well.  She is on several natural supplements.  Exam is unremarkable.  Plan: 1.    Labs today: CBC with diff, CMP, CA27.29. 2.    Clinical stage IA right breast cancer Patient is s/p right mastectomy and SLN on 05/05/2019. Oncotype DX testing- < 1% benefit of chemotherapy.             She declined endocrine therapy.  Patient currently on a natural hormone blocker regimen.  Continue to monitor. 3.Chronic urticaria Etiologyremainsunclear. There were no inciting events. She hasno evidence of a myeloproliferative disorder, paraprotein or mastocytosis. Allergy testing was normal. Continue to monitor. 4.Osteopenia Review interval bone density on 06/08/2019 which revealed osteopenia with a T-score of -1.7 in the AP spine L1-L4.   Discuss calcium 1200 mg a day and vitamin D 800 IU a day.  Discuss consideration of a bisphosphonate or a RANK ligand if bone density declines. 5. RN to call patient re: CA27.29. 6.   Schedule bilateral mammogram 03/25/2020. 7.   RTC in 3 months for MD assessment and labs (CBC with diff, CMP, CA27.29).  I discussed the assessment and treatment plan with the patient.  The patient was provided an opportunity to ask questions and all were answered.  The patient agreed with the plan and demonstrated an understanding of the instructions.  The patient was advised to call back if the symptoms worsen or if the condition fails to improve as anticipated.   Lequita Asal, MD, PhD    08/22/2019, 9:57 AM  I, Selena Batten, am acting as scribe for [employer]. Mike Gip, MD, PhD.  I, Melissa C. Mike Gip, MD, have reviewed the above documentation for accuracy and completeness, and I agree with the above.

## 2019-08-22 ENCOUNTER — Inpatient Hospital Stay: Payer: BLUE CROSS/BLUE SHIELD | Attending: Hematology and Oncology

## 2019-08-22 ENCOUNTER — Other Ambulatory Visit: Payer: Self-pay

## 2019-08-22 ENCOUNTER — Other Ambulatory Visit: Payer: Self-pay | Admitting: Hematology and Oncology

## 2019-08-22 ENCOUNTER — Encounter: Payer: Self-pay | Admitting: Hematology and Oncology

## 2019-08-22 ENCOUNTER — Inpatient Hospital Stay (HOSPITAL_BASED_OUTPATIENT_CLINIC_OR_DEPARTMENT_OTHER): Payer: BLUE CROSS/BLUE SHIELD | Admitting: Hematology and Oncology

## 2019-08-22 VITALS — BP 117/76 | HR 83 | Temp 97.4°F | Resp 18 | Wt 160.9 lb

## 2019-08-22 DIAGNOSIS — M8588 Other specified disorders of bone density and structure, other site: Secondary | ICD-10-CM | POA: Diagnosis not present

## 2019-08-22 DIAGNOSIS — T783XXA Angioneurotic edema, initial encounter: Secondary | ICD-10-CM | POA: Diagnosis not present

## 2019-08-22 DIAGNOSIS — Z170 Estrogen receptor positive status [ER+]: Secondary | ICD-10-CM | POA: Insufficient documentation

## 2019-08-22 DIAGNOSIS — K219 Gastro-esophageal reflux disease without esophagitis: Secondary | ICD-10-CM | POA: Diagnosis not present

## 2019-08-22 DIAGNOSIS — E669 Obesity, unspecified: Secondary | ICD-10-CM | POA: Diagnosis not present

## 2019-08-22 DIAGNOSIS — C50411 Malignant neoplasm of upper-outer quadrant of right female breast: Secondary | ICD-10-CM | POA: Insufficient documentation

## 2019-08-22 DIAGNOSIS — Z9010 Acquired absence of unspecified breast and nipple: Secondary | ICD-10-CM | POA: Diagnosis not present

## 2019-08-22 DIAGNOSIS — M8580 Other specified disorders of bone density and structure, unspecified site: Secondary | ICD-10-CM | POA: Diagnosis not present

## 2019-08-22 DIAGNOSIS — E119 Type 2 diabetes mellitus without complications: Secondary | ICD-10-CM | POA: Insufficient documentation

## 2019-08-22 LAB — CBC WITH DIFFERENTIAL/PLATELET
Abs Immature Granulocytes: 0.02 10*3/uL (ref 0.00–0.07)
Basophils Absolute: 0 10*3/uL (ref 0.0–0.1)
Basophils Relative: 1 %
Eosinophils Absolute: 0.2 10*3/uL (ref 0.0–0.5)
Eosinophils Relative: 3 %
HCT: 41.3 % (ref 36.0–46.0)
Hemoglobin: 14.3 g/dL (ref 12.0–15.0)
Immature Granulocytes: 0 %
Lymphocytes Relative: 37 %
Lymphs Abs: 1.9 10*3/uL (ref 0.7–4.0)
MCH: 29.5 pg (ref 26.0–34.0)
MCHC: 34.6 g/dL (ref 30.0–36.0)
MCV: 85.3 fL (ref 80.0–100.0)
Monocytes Absolute: 0.5 10*3/uL (ref 0.1–1.0)
Monocytes Relative: 9 %
Neutro Abs: 2.5 10*3/uL (ref 1.7–7.7)
Neutrophils Relative %: 50 %
Platelets: 205 10*3/uL (ref 150–400)
RBC: 4.84 MIL/uL (ref 3.87–5.11)
RDW: 11.9 % (ref 11.5–15.5)
WBC: 5.1 10*3/uL (ref 4.0–10.5)
nRBC: 0 % (ref 0.0–0.2)

## 2019-08-22 LAB — COMPREHENSIVE METABOLIC PANEL
ALT: 25 U/L (ref 0–44)
AST: 26 U/L (ref 15–41)
Albumin: 4.2 g/dL (ref 3.5–5.0)
Alkaline Phosphatase: 83 U/L (ref 38–126)
Anion gap: 11 (ref 5–15)
BUN: 11 mg/dL (ref 6–20)
CO2: 22 mmol/L (ref 22–32)
Calcium: 9.4 mg/dL (ref 8.9–10.3)
Chloride: 102 mmol/L (ref 98–111)
Creatinine, Ser: 0.59 mg/dL (ref 0.44–1.00)
GFR calc Af Amer: >60 mL/min (ref >60)
GFR calc non Af Amer: >60 mL/min (ref >60)
Glucose, Bld: 122 mg/dL — ABNORMAL HIGH (ref 70–99)
Potassium: 4 mmol/L (ref 3.5–5.1)
Sodium: 135 mmol/L (ref 135–145)
Total Bilirubin: 0.8 mg/dL (ref 0.3–1.2)
Total Protein: 7 g/dL (ref 6.5–8.1)

## 2019-08-22 NOTE — Progress Notes (Signed)
Patient had mastectomy November 2020 and would like to know how often she should go for her mammogram now.

## 2019-08-23 ENCOUNTER — Telehealth: Payer: Self-pay

## 2019-08-23 LAB — CANCER ANTIGEN 27.29: CA 27.29: 21.9 U/mL (ref 0.0–38.6)

## 2019-08-23 NOTE — Telephone Encounter (Signed)
spoke with the patient to inform her that the CA 27.29 was 21.9 The patient was understanding.

## 2019-11-01 ENCOUNTER — Ambulatory Visit: Payer: BLUE CROSS/BLUE SHIELD | Admitting: Family Medicine

## 2019-11-01 ENCOUNTER — Other Ambulatory Visit: Payer: Self-pay | Admitting: Family Medicine

## 2019-11-01 ENCOUNTER — Other Ambulatory Visit: Payer: Self-pay

## 2019-11-01 ENCOUNTER — Encounter: Payer: Self-pay | Admitting: Family Medicine

## 2019-11-01 VITALS — BP 110/70 | HR 93 | Temp 96.9°F | Resp 16 | Ht 62.0 in | Wt 163.4 lb

## 2019-11-01 DIAGNOSIS — E785 Hyperlipidemia, unspecified: Secondary | ICD-10-CM | POA: Diagnosis not present

## 2019-11-01 DIAGNOSIS — E118 Type 2 diabetes mellitus with unspecified complications: Secondary | ICD-10-CM | POA: Diagnosis not present

## 2019-11-01 DIAGNOSIS — L509 Urticaria, unspecified: Secondary | ICD-10-CM

## 2019-11-01 DIAGNOSIS — E559 Vitamin D deficiency, unspecified: Secondary | ICD-10-CM | POA: Diagnosis not present

## 2019-11-01 DIAGNOSIS — E1169 Type 2 diabetes mellitus with other specified complication: Secondary | ICD-10-CM

## 2019-11-01 DIAGNOSIS — K219 Gastro-esophageal reflux disease without esophagitis: Secondary | ICD-10-CM

## 2019-11-01 DIAGNOSIS — Z853 Personal history of malignant neoplasm of breast: Secondary | ICD-10-CM

## 2019-11-01 LAB — POCT GLYCOSYLATED HEMOGLOBIN (HGB A1C): Hemoglobin A1C: 6.1 % — AB (ref 4.0–5.6)

## 2019-11-01 NOTE — Progress Notes (Signed)
Name: Terri Tanner   MRN: [id number]    DOB: Oct 15, 1960   Date:11/01/2019       Progress Note  Subjective  Chief Complaint  Chief Complaint  Patient presents with  . Diabetes  . Gastroesophageal Reflux  . Dyslipidemia    HPI  Hives: going on since July 2020  seen by Dr. Donneta Romberg, had negative allergy testing, she weaned self off medications completely almost 5 days ago and has intermittent itching but trying not to scratch, she does not want to resume medications at this time  Breast Cancer right side: she has mastectomy done  ( Dr. Fleet Contras ), Nov 13 th, 2020  , she did not have chemo or radiation therapy, she is seeing an integrative medicine practice called [facility] in [facility] and is taking some supplements. . Discussed importance of going open minded and decide after the surgery what would be the best option for her.   Diabetes with dyslipidemia: she has changed her diet, she has been walking. She has been avoiding sugar even more since diagnosed with cancer. She denies polyphagia, polydipsia or polyuria. She has dyslipidemia she initially refused to take statin therapy but seen by an integrative medicine physician Raoul Pitch  and is now taking pravastatin low dose and tolerating it well.    Vitamin D deficiency: she is taking supplementation , unchanged   GERD: doing well on supplementation , cough has improved with better control of GERD   Patient Active Problem List   Diagnosis Date Noted  . Osteopenia of lumbar spine 08/22/2019  . Malignant neoplasm of upper-outer quadrant of right breast in female, estrogen receptor positive ([place]) 04/13/2019  . Urticaria 02/22/2019  . Long-term use of high-risk medication 05/12/2018  . Rectal polyp   . Diabetes mellitus type 2 in obese ([place]) 02/06/2018  . Dyslipidemia associated with type 2 diabetes mellitus ([place]) 02/06/2018  . GERD (gastroesophageal reflux disease) 02/04/2018  . Vitamin D deficiency 02/04/2018  . Chronic cough  02/04/2018  . Obesity (BMI 30-39.9) 02/04/2018    Past Surgical History:  Procedure Laterality Date  . CERVICAL POLYPECTOMY    . CESAREAN SECTION    . COLONOSCOPY WITH PROPOFOL N/A 03/03/2018   Procedure: COLONOSCOPY WITH PROPOFOL;  Surgeon: Jonathon Bellows, MD;  Location: [facility] ENDOSCOPY;  Service: Gastroenterology;  Laterality: N/A;  . SENTINEL NODE BIOPSY Right 05/05/2019   Procedure: SENTINEL NODE BIOPSY;  Surgeon: Robert Bellow, MD;  Location: [facility] ORS;  Service: General;  Laterality: Right;  . SIMPLE MASTECTOMY WITH AXILLARY SENTINEL NODE BIOPSY Right 05/05/2019   Procedure: SIMPLE MASTECTOMY;  Surgeon: Robert Bellow, MD;  Location: [facility] ORS;  Service: General;  Laterality: Right;  . TRANSANAL EXCISION OF RECTAL MASS N/A 04/14/2018   Procedure: TRANSANAL EXCISION OF RECTAL POLYP;  Surgeon: Jules Husbands, MD;  Location: [facility] ORS;  Service: General;  Laterality: N/A;  . WISDOM TOOTH EXTRACTION    . WISDOM TOOTH EXTRACTION      Family History  Problem Relation Age of Onset  . Lung cancer Mother        smoked for many years  . COPD Mother   . Emphysema Mother   . Esophageal cancer Father        smoker  . Suicidality Father   . Cancer Sister   . Stroke Brother   . Alcohol abuse Brother   . Other Brother        huge smoker  . Diabetes Sister   . Breast cancer  Neg Hx     Social History   Tobacco Use  . Smoking status: Never Smoker  . Smokeless tobacco: Never Used  Substance Use Topics  . Alcohol use: Not Currently    Comment: rarely     Current Outpatient Medications:  .  Acetylcysteine (NAC PO), Take 2,400 mg/day by mouth., Disp: , Rfl:  .  Ascorbic Acid (VITAMIN C) 1000 MG tablet, Take 1,000 mg by mouth daily. 4000 mg/day, Disp: , Rfl:  .  Cholecalciferol (VITAMIN D3 PO), Take 7,500 Units/day by mouth., Disp: , Rfl:  .  EPINEPHrine 0.3 mg/0.3 mL IJ SOAJ injection, Inject 0.3 mLs (0.3 mg total) into the muscle as needed for anaphylaxis., Disp: 2 each, Rfl:  0 .  MAGNESIUM MALATE PO, Take by mouth. nightly, Disp: , Rfl:  .  MELATONIN PO, Take 20 mg by mouth. nightly, Disp: , Rfl:  .  Menaquinone-7 (VITAMIN K2 PO), Take 150 mcg/day by mouth., Disp: , Rfl:  .  Methylsulfonylmethane (MSM PO), Take 4,000 mg/day by mouth., Disp: , Rfl:  .  Multiple Vitamin (MULTIVITAMIN) tablet, Take 1 tablet by mouth daily., Disp: , Rfl:  .  NON FORMULARY, Liposomal glutathione 250mg /day, Disp: , Rfl:  .  Omega-3 Fatty Acids (SUPER OMEGA 3 EPA/DHA PO), Take by mouth., Disp: , Rfl:  .  pravastatin (PRAVACHOL) 10 MG tablet, Take 10 mg by mouth at bedtime., Disp: , Rfl:  .  Turmeric (QC TUMERIC COMPLEX) 500 MG CAPS, Take by mouth., Disp: , Rfl:  .  vitamin E (VITAMIN E) 180 MG (400 UNITS) capsule, Take 400 Units by mouth daily., Disp: , Rfl:  .  Linoleic Acid-Sunflower Oil (CLA PO), Take 2,400 mg/day by mouth., Disp: , Rfl:   No Known Allergies  I personally reviewed active problem list, medication list, allergies, family history, social history, health maintenance with the patient/caregiver today.   ROS  Constitutional: Negative for fever , positive for  weight change.  Respiratory: Negative for cough and shortness of breath.   Cardiovascular: Negative for chest pain or palpitations.  Gastrointestinal: Negative for abdominal pain, no bowel changes.  Musculoskeletal: Negative for gait problem or joint swelling.  Skin: Negative for rash.  Neurological: Negative for dizziness or headache.  No other specific complaints in a complete review of systems (except as listed in HPI above).  Objective  Vitals:   11/01/19 0819  BP: 110/70  Pulse: 93  Resp: 16  Temp: (!) 96.9 F (36.1 C)  TempSrc: Temporal  SpO2: 98%  Weight: 163 lb 6.4 oz (74.1 kg)  Height: 5\' 2"  (1.575 m)    Body mass index is 29.89 kg/m.  Physical Exam  Constitutional: Patient appears well-developed and well-nourished. o distress.  HEENT: head atraumatic, normocephalic, pupils equal and  reactive to light  Cardiovascular: Normal rate, regular rhythm and normal heart sounds.  No murmur heard. No BLE edema. Pulmonary/Chest: Effort normal and breath sounds normal. No respiratory distress. Abdominal: Soft.  There is no tenderness. Psychiatric: Patient has a normal mood and affect. behavior is normal. Judgment and thought content normal.  Recent Results (from the past 2160 hour(s))  CBC with Differential/Platelet     Status: None   Collection Time: 08/22/19  9:04 AM  Result Value Ref Range   WBC 5.1 4.0 - 10.5 K/uL   RBC 4.84 3.87 - 5.11 MIL/uL   Hemoglobin 14.3 12.0 - 15.0 g/dL   HCT 41.3 36.0 - 46.0 %   MCV 85.3 80.0 - 100.0 fL   MCH  29.5 26.0 - 34.0 pg   MCHC 34.6 30.0 - 36.0 g/dL   RDW 11.9 11.5 - 15.5 %   Platelets 205 150 - 400 K/uL   nRBC 0.0 0.0 - 0.2 %   Neutrophils Relative % 50 %   Neutro Abs 2.5 1.7 - 7.7 K/uL   Lymphocytes Relative 37 %   Lymphs Abs 1.9 0.7 - 4.0 K/uL   Monocytes Relative 9 %   Monocytes Absolute 0.5 0.1 - 1.0 K/uL   Eosinophils Relative 3 %   Eosinophils Absolute 0.2 0.0 - 0.5 K/uL   Basophils Relative 1 %   Basophils Absolute 0.0 0.0 - 0.1 K/uL   Immature Granulocytes 0 %   Abs Immature Granulocytes 0.02 0.00 - 0.07 K/uL    Comment: Performed at [facility], [address], [place], [place] [address]  Comprehensive metabolic panel     Status: Abnormal   Collection Time: 08/22/19  9:04 AM  Result Value Ref Range   Sodium 135 135 - 145 mmol/L   Potassium 4.0 3.5 - 5.1 mmol/L   Chloride 102 98 - 111 mmol/L   CO2 22 22 - 32 mmol/L   Glucose, Bld 122 (H) 70 - 99 mg/dL    Comment: Glucose reference range applies only to samples taken after fasting for at least 8 hours.   BUN 11 6 - 20 mg/dL   Creatinine, Ser 0.59 0.44 - 1.00 mg/dL   Calcium 9.4 8.9 - 10.3 mg/dL   Total Protein 7.0 6.5 - 8.1 g/dL   Albumin 4.2 3.5 - 5.0 g/dL   AST 26 15 - 41 U/L   ALT 25 0 - 44 U/L   Alkaline Phosphatase 83 38 - 126 U/L   Total  Bilirubin 0.8 0.3 - 1.2 mg/dL   GFR calc non Af Amer >60 >60 mL/min   GFR calc Af Amer >60 >60 mL/min   Anion gap 11 5 - 15    Comment: Performed at [facility], [address], [place], [place] [address]  Cancer antigen 27.29     Status: None   Collection Time: 08/22/19  9:04 AM  Result Value Ref Range   CA 27.29 21.9 0.0 - 38.6 U/mL    Comment: (NOTE) Siemens Centaur Immunochemiluminometric Methodology (ICMA) Values obtained with different assay methods or kits cannot be used interchangeably. Results cannot be interpreted as absolute evidence of the presence or absence of malignant disease. Performed At: [facility] [place], [place] [id number] Rush Farmer MD [id number]   POCT HgB A1C     Status: Abnormal   Collection Time: 11/01/19  8:29 AM  Result Value Ref Range   Hemoglobin A1C 6.1 (A) 4.0 - 5.6 %   HbA1c POC (<> result, manual entry)     HbA1c, POC (prediabetic range)     HbA1c, POC (controlled diabetic range)      Diabetic Foot Exam: Diabetic Foot Exam - Simple   Simple Foot Form Diabetic Foot exam was performed with the following findings: Yes 11/01/2019  9:04 AM  Visual Inspection No deformities, no ulcerations, no other skin breakdown bilaterally: Yes Sensation Testing Intact to touch and monofilament testing bilaterally: Yes Pulse Check Posterior Tibialis and Dorsalis pulse intact bilaterally: Yes Comments      PHQ2/9: Depression screen [facility] 2/9 11/01/2019 05/04/2019 02/09/2019 11/10/2018 07/14/2018  Decreased Interest 0 0 0 0 0  Down, Depressed, Hopeless 0 0 0 0 0  PHQ - 2 Score 0 0 0  0 0  Altered sleeping 0 0 0 0 0  Tired, decreased energy 0 0 0 0 0  Change in appetite 0 0 0 0 0  Feeling bad or failure about yourself  0 0 0 0 0  Trouble concentrating 0 0 0 0 0  Moving slowly or fidgety/restless 0 0 0 0 0  Suicidal thoughts 0 0 0 0 0  PHQ-9 Score 0 0 0 0 0  Difficult doing work/chores - - - Not difficult at all  Not difficult at all    phq 9 is negative   Fall Risk: Fall Risk  11/01/2019 05/04/2019 02/09/2019 11/10/2018 07/14/2018  Falls in the past year? 0 0 0 0 0  Number falls in past yr: 0 0 0 0 0  Injury with Fall? 0 0 0 0 0  Follow up - - - - Falls evaluation completed     Functional Status Survey: Is the patient deaf or have difficulty hearing?: No Does the patient have difficulty seeing, even when wearing glasses/contacts?: No Does the patient have difficulty concentrating, remembering, or making decisions?: No Does the patient have difficulty walking or climbing stairs?: No Does the patient have difficulty dressing or bathing?: No Does the patient have difficulty doing errands alone such as visiting a doctor's office or shopping?: No   Assessment & Plan  1. Dyslipidemia associated with type 2 diabetes mellitus ([place])  - POCT HgB A1C - Lipid panel  2. Controlled type 2 diabetes mellitus with complication, without long-term current use of insulin ([facility])  - POCT HgB A1C  3. GERD without esophagitis  She states doing better since taking some supplements  4. Vitamin D deficiency  Continue supplementation   5. Urticaria  She states she is doing better, off medications  6. History of right breast cancer  Up to date with oncologist and surgeon visits

## 2019-11-08 ENCOUNTER — Encounter: Payer: Self-pay | Admitting: Family Medicine

## 2019-11-26 NOTE — Progress Notes (Signed)
[facility]  [address], [address] [place], [place] [address] Phone: [phone]  Fax: [phone]   Clinic Day:  11/27/2019  Referring physician: Steele Sizer, MD  Chief Complaint: Terri Tanner is a 59 y.o. female with stage IA right breast cancer s/p mastectomy and sentinel lymph node biopsy who is seen for a 3 month assessment.  HPI: The patient was last seen in the medical oncology clinic on 08/22/2019. At that time, she was doing well.  She was on several natural supplements.  Exam was unremarkable. CA 27.29 was 21.9.  She declined endocrine therapy.  She was on a natural hormone blocker regimen.  During the interim, she has been well. She continues her self breast exams. She has self-discontinued some of her supplements as she was getting nauseous. She states that she was taking around 40 pills per day, and she cut about half of them out.   She notes that she has been following social distance guidelines, but she has no intention of getting the COVID-19 vaccine.   She does not believe that she has had any exposure to hepatitis.    Past Medical History:  Diagnosis Date  . Allergy   . Diabetes mellitus type 2 in obese ([place]) 02/06/2018  . Environmental and seasonal allergies   . GERD (gastroesophageal reflux disease)     Past Surgical History:  Procedure Laterality Date  . CERVICAL POLYPECTOMY    . CESAREAN SECTION    . COLONOSCOPY WITH PROPOFOL N/A 03/03/2018   Procedure: COLONOSCOPY WITH PROPOFOL;  Surgeon: Jonathon Bellows, MD;  Location: [facility] ENDOSCOPY;  Service: Gastroenterology;  Laterality: N/A;  . SENTINEL NODE BIOPSY Right 05/05/2019   Procedure: SENTINEL NODE BIOPSY;  Surgeon: Robert Bellow, MD;  Location: [facility] ORS;  Service: General;  Laterality: Right;  . SIMPLE MASTECTOMY WITH AXILLARY SENTINEL NODE BIOPSY Right 05/05/2019   Procedure: SIMPLE MASTECTOMY;  Surgeon: Robert Bellow, MD;  Location: [facility] ORS;  Service: General;   Laterality: Right;  . TRANSANAL EXCISION OF RECTAL MASS N/A 04/14/2018   Procedure: TRANSANAL EXCISION OF RECTAL POLYP;  Surgeon: Jules Husbands, MD;  Location: [facility] ORS;  Service: General;  Laterality: N/A;  . WISDOM TOOTH EXTRACTION    . WISDOM TOOTH EXTRACTION      Family History  Problem Relation Age of Onset  . Lung cancer Mother        smoked for many years  . COPD Mother   . Emphysema Mother   . Esophageal cancer Father        smoker  . Suicidality Father   . Cancer Sister   . Stroke Brother   . Alcohol abuse Brother   . Other Brother        huge smoker  . Diabetes Sister   . Breast cancer Neg Hx     Social History:  reports that she has never smoked. She has never used smokeless tobacco. She reports previous alcohol use. She reports that she does not use drugs. She denies any exposure to radiation or toxins. She lives with her daughter and grandchildren in [place]. She moved in July. 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 The patient is alone today.    Allergies: No Known Allergies  Current Medications: Current Outpatient Medications  Medication Sig Dispense Refill  . Acetylcysteine (NAC PO) Take 2,400 mg/day by mouth.    . Ascorbic Acid (VITAMIN C) 1000 MG tablet Take 1,000 mg by mouth daily. 4000 mg/day    . Barberry-Oreg  Grape-Goldenseal (BERBERINE COMPLEX PO) Take by mouth.    . Cholecalciferol (VITAMIN D3 PO) Take 7,500 Units/day by mouth.    . Digestive Enzymes (BETAINE HCL PO) Take by mouth.    . Digestive Enzymes (DIGESTIVE ENZYME PO) Take by mouth.    . EPINEPHrine 0.3 mg/0.3 mL IJ SOAJ injection Inject 0.3 mLs (0.3 mg total) into the muscle as needed for anaphylaxis. 2 each 0  . famotidine (PEPCID) 40 MG tablet Take 40 mg by mouth daily.    . hydrOXYzine (ATARAX/VISTARIL) 25 MG tablet Take 25 mg by mouth 3 (three) times daily as needed.    Marland Kitchen levocetirizine (XYZAL) 5 MG tablet Take 5 mg by mouth every evening.    Marland Kitchen MAGNESIUM MALATE PO Take by mouth.  nightly    . MELATONIN PO Take 20 mg by mouth. nightly    . Menaquinone-7 (VITAMIN K2 PO) Take 150 mcg/day by mouth.    . Methylsulfonylmethane (MSM PO) Take 4,000 mg/day by mouth.    . Multiple Vitamin (MULTIVITAMIN) tablet Take 1 tablet by mouth daily.    . NON FORMULARY Liposomal glutathione 269m/day    . Nutritional Supplements (CALCIUM D-GLUCARATE PO) Take 150 mg by mouth every 30 (thirty) days. (DGL)    . Omega-3 Fatty Acids (SUPER OMEGA 3 EPA/DHA PO) Take by mouth.    .Marland KitchenOVER THE COUNTER MEDICATION ESTROGEN CONTROL    . pravastatin (PRAVACHOL) 10 MG tablet Take 10 mg by mouth at bedtime.     No current facility-administered medications for this visit.    Review of Systems  Constitutional: Negative.  Negative for chills, diaphoresis, fever, malaise/fatigue and weight loss (up 6 lbs since 04/13/2019).       Feels "great".  Active.  HENT: Negative.  Negative for congestion, ear discharge, ear pain, hearing loss, nosebleeds, sinus pain and sore throat.   Eyes: Negative.  Negative for blurred vision, double vision and photophobia.  Respiratory: Negative.  Negative for cough, hemoptysis, sputum production, shortness of breath and wheezing.   Cardiovascular: Negative.  Negative for chest pain, palpitations, orthopnea, leg swelling and PND.  Gastrointestinal: Negative.  Negative for abdominal pain, blood in stool, constipation, diarrhea, heartburn (resolved x 2 weeks s/p starting natural supplements, continues monthly maintanence supplements), melena, nausea (nausea on 40 supplements - cut half out, now resolved) and vomiting.  Genitourinary: Negative.  Negative for dysuria, frequency and urgency.  Musculoskeletal: Negative.  Negative for back pain, joint pain, myalgias and neck pain.  Skin: Positive for itching (back of the head; related to hives). Negative for rash.       Hives every morning.  Neurological: Negative for dizziness, tingling, sensory change, speech change, focal weakness,  loss of consciousness, weakness and headaches.  Endo/Heme/Allergies: Negative.  Does not bruise/bleed easily.  Psychiatric/Behavioral: Negative.  Negative for depression and memory loss. The patient is not nervous/anxious and does not have insomnia.   All other systems reviewed and are negative.  Performance status (ECOG): 1 - Symptomatic but completely ambulatory  Vitals Blood pressure 130/80, pulse 85, temperature (!) 97.1 F (36.2 C), temperature source Tympanic, resp. rate 16, weight 166 lb 5.4 oz (75.4 kg), SpO2 98 %.   Physical Exam  Constitutional: She is oriented to person, place, and time. She appears well-developed and well-nourished. No distress.  HENT:  Head: Normocephalic and atraumatic.  Mouth/Throat: Oropharynx is clear and moist. No oropharyngeal exudate.  Long dark brown hair.  Eyes: Pupils are equal, round, and reactive to light. Conjunctivae and EOM are normal.  No scleral icterus.  Glasses.  Brown eyes.  Neck: No JVD present.  Cardiovascular: Normal rate, regular rhythm and normal heart sounds.  No murmur heard. Pulmonary/Chest: Effort normal and breath sounds normal. No respiratory distress. She has no wheezes. She has no rales. She exhibits no tenderness. Left breast exhibits no inverted nipple, no mass, no nipple discharge, no skin change and no tenderness. Breasts are symmetrical (Right mastectomy without erythema or nodularity).  Slight RIGHT lateral chest wall hyperpigmentation.  Abdominal: Soft. Bowel sounds are normal. She exhibits no distension and no mass. There is no abdominal tenderness. There is no rebound and no guarding.  Musculoskeletal:        General: No tenderness or edema. Normal range of motion.     Cervical back: Normal range of motion and neck supple.  Lymphadenopathy:       Head (right side): No preauricular, no posterior auricular and no occipital adenopathy present.       Head (left side): No preauricular, no posterior auricular and no  occipital adenopathy present.    She has no cervical adenopathy.    She has no axillary adenopathy.       Right: No supraclavicular adenopathy present.       Left: No supraclavicular adenopathy present.  Neurological: She is alert and oriented to person, place, and time.  Skin: Skin is warm and dry. She is not diaphoretic. No pallor.  Psychiatric: She has a normal mood and affect. Her behavior is normal. Judgment and thought content normal.  Nursing note and vitals reviewed.   Appointment on 11/27/2019  Component Date Value Ref Range Status  . Sodium 11/27/2019 136  135 - 145 mmol/L Final  . Potassium 11/27/2019 4.0  3.5 - 5.1 mmol/L Final  . Chloride 11/27/2019 102  98 - 111 mmol/L Final  . CO2 11/27/2019 26  22 - 32 mmol/L Final  . Glucose, Bld 11/27/2019 121* 70 - 99 mg/dL Final   Glucose reference range applies only to samples taken after fasting for at least 8 hours.  . BUN 11/27/2019 9  6 - 20 mg/dL Final  . Creatinine, Ser 11/27/2019 0.67  0.44 - 1.00 mg/dL Final  . Calcium 11/27/2019 9.5  8.9 - 10.3 mg/dL Final  . Total Protein 11/27/2019 7.3  6.5 - 8.1 g/dL Final  . Albumin 11/27/2019 4.2  3.5 - 5.0 g/dL Final  . AST 11/27/2019 58* 15 - 41 U/L Final  . ALT 11/27/2019 98* 0 - 44 U/L Final  . Alkaline Phosphatase 11/27/2019 93  38 - 126 U/L Final  . Total Bilirubin 11/27/2019 0.6  0.3 - 1.2 mg/dL Final  . GFR calc non Af Amer 11/27/2019 >60  >60 mL/min Final  . GFR calc Af Amer 11/27/2019 >60  >60 mL/min Final  . Anion gap 11/27/2019 8  5 - 15 Final   Performed at [facility], [address], [place], [place] [address]  . WBC 11/27/2019 5.5  4.0 - 10.5 K/uL Final  . RBC 11/27/2019 4.87  3.87 - 5.11 MIL/uL Final  . Hemoglobin 11/27/2019 14.6  12.0 - 15.0 g/dL Final  . HCT 11/27/2019 43.0  36.0 - 46.0 % Final  . MCV 11/27/2019 88.3  80.0 - 100.0 fL Final  . MCH 11/27/2019 30.0  26.0 - 34.0 pg Final  . MCHC 11/27/2019 34.0  30.0 - 36.0 g/dL Final  . RDW  11/27/2019 12.2  11.5 - 15.5 % Final  . Platelets 11/27/2019 222  150 -  400 K/uL Final  . nRBC 11/27/2019 0.0  0.0 - 0.2 % Final  . Neutrophils Relative % 11/27/2019 50  % Final  . Neutro Abs 11/27/2019 2.8  1.7 - 7.7 K/uL Final  . Lymphocytes Relative 11/27/2019 36  % Final  . Lymphs Abs 11/27/2019 2.0  0.7 - 4.0 K/uL Final  . Monocytes Relative 11/27/2019 10  % Final  . Monocytes Absolute 11/27/2019 0.5  0.1 - 1.0 K/uL Final  . Eosinophils Relative 11/27/2019 2  % Final  . Eosinophils Absolute 11/27/2019 0.1  0.0 - 0.5 K/uL Final  . Basophils Relative 11/27/2019 1  % Final  . Basophils Absolute 11/27/2019 0.1  0.0 - 0.1 K/uL Final  . Immature Granulocytes 11/27/2019 1  % Final  . Abs Immature Granulocytes 11/27/2019 0.04  0.00 - 0.07 K/uL Final   Performed at [facility], [address] [place], [place] [address]    Assessment:  Terri Tanner is a 59 y.o. female withstage IA right breast cancers/p right simple mastectomy and sentinel node biopsy on 05/05/2019.  Pathology revealed a 1.3 x 1.1 x 0.8 cm grade I invasive mammary carcinoma, no special type. Three sentinel lymph nodes were negative.  Tumor was ER positive (>90%). PR positive (40%) and HER2/neu negative.  Pathologic stage was pT1c pN0.  Oncotype DX revealed < 1% benefit of chemotherapy.  Bilateraldiagnosticmammogramon 03/24/2019 revealed a suspicious 1.1 cmmass in the right breastat the 11 o'clock position and a 0.5 cm oval circumscribed mass in the upper outer left breast.Targeted ultrasoundof the right breast revealed an irregular 1.3 x 0.8 x 1.1 cm hypoechoic mass at 11 o'clock 4 cm from the nipple. Lymph nodes were unremarkable. Targeted ultrasound of the left breast revealed a cluster of microcysts at 2 o'clock 6 cm from nipple measuring 0.7 x 0.2 x 0.7 cm.  CA 27.29 has been followed: 13.9 on 04/13/2019, 21.9 on 08/22/2019 and 26 on 11/27/2019.    Bone density on 06/08/2019 revealed  osteopenia with a T-score of -1.7 in the AP spine L1-L4.   She has a family historyof breast cancer, ovarian cancer, endometrial cancer, brain tumors, and smoking related cancers (lung and head and neck).  [facility] genetic testing on 04/13/2019 was negative.  She has a history ofchronic urticariasince 12/2018 and mild leukocytosissince 02/09/2019. WBChas ranged between 11,100 and 13,600. Differential has been predominantly neutrophils. Etiology is felt reactive. CRP has been elevated.  Work-up on 09/02/2020revealed a hematocrit of 40.8, hemoglobin 13.9, MCV 87.4, platelets 296,000, white count 12,700 with an [place] of10,300. Differentialincluded 80% segs, 13% lymphs, 5% monocytes, and1% eosinophils.ANA was negative.Tryptasewas 8.5 (2.2-13.2). SPEP revealed no monoclonal protein. IgG was 621, IgA 141,IgM 115, andIgE4 (low).  Peripheralsmearrevealed mild leukocytosis with neutrophilia and minimal left shifted maturation. There is no significant morphologic abnormalities of leukocytes. There was unremarkable RBC morphology and indices. There was normal platelet count and morphology.   She has chronic urticariaand intermittent angioedema of her lips. Etiology does not appear related to any medications, herbal products, foods, inset bites, infections, or NSAIDs. She has no apparent autoimmune disease.  She has no plans to receive the COVID-19 vaccine.   Symptomatically, she feels great.  She denies any breast concerns.  She has cut her supplements in half secondary to nausea.  Exam is stable.  LFTs are elevated.  Plan: 1.    Labs today: CBC with diff, CMP, CA27.29. 2.    Clinical stage IA right breast cancer Patient is s/p right mastectomy and SLN on  05/05/2019. Oncotype DX testing- < 1% benefit of chemotherapy.             She declined endocrine therapy.  Patient currently on a natural hormone blocker regimen.  CA 27.29 is normal (available  after clinic).  Symptomatically, she denies any breast concerns.  Exam reveals no evidence of recurrent disease.  Continue to monitor. 3.Chronic urticaria Etiologyisunclear. There appears to be no inciting events. She hasno evidence of a myeloproliferative disorder, paraprotein or mastocytosis. Allergy testing was normal. 4.Osteopenia Bone density on 06/08/2019 revealed osteopenia with a T-score of -1.7 in the AP spine L1-L4.   She is on calcium and vitamin D.  Consider a bisphosphonate or a RANK ligand if bone density declines. 5. Elevated liver function tests  AST 26 and ALT 25 on 08/22/2019.  AST 58 and ALT 98 on 11/27/2019.  Etiology is unclear.  Check hepatitis serologies.  Consider liver imaging if etiology remains unclear 6.   RN to call patient re: CA27.29. 7.   Schedule bilateral mammogram 03/25/2020. 8.   RTC in 3 months for MD assessment and labs (CBC with diff, CMP, CA27.29).  I discussed the assessment and treatment plan with the patient.  The patient was provided an opportunity to ask questions and all were answered.  The patient agreed with the plan and demonstrated an understanding of the instructions.  The patient was advised to call back if the symptoms worsen or if the condition fails to improve as anticipated.   Lequita Asal, MD, PhD    11/27/2019, 11:58 AM  I, Jacqualyn Posey, am acting as a Education administrator for [employer]. Mike Gip, MD.   I, Chamika Cunanan C. Mike Gip, MD, have reviewed the above documentation for accuracy and completeness, and I agree with the above.

## 2019-11-27 ENCOUNTER — Inpatient Hospital Stay: Payer: BLUE CROSS/BLUE SHIELD

## 2019-11-27 ENCOUNTER — Inpatient Hospital Stay: Payer: BLUE CROSS/BLUE SHIELD | Attending: Hematology and Oncology | Admitting: Hematology and Oncology

## 2019-11-27 ENCOUNTER — Other Ambulatory Visit: Payer: Self-pay

## 2019-11-27 ENCOUNTER — Encounter: Payer: Self-pay | Admitting: Hematology and Oncology

## 2019-11-27 VITALS — BP 130/80 | HR 85 | Temp 97.1°F | Resp 16 | Wt 166.3 lb

## 2019-11-27 DIAGNOSIS — Z170 Estrogen receptor positive status [ER+]: Secondary | ICD-10-CM

## 2019-11-27 DIAGNOSIS — M8588 Other specified disorders of bone density and structure, other site: Secondary | ICD-10-CM

## 2019-11-27 DIAGNOSIS — E119 Type 2 diabetes mellitus without complications: Secondary | ICD-10-CM | POA: Insufficient documentation

## 2019-11-27 DIAGNOSIS — R7989 Other specified abnormal findings of blood chemistry: Secondary | ICD-10-CM

## 2019-11-27 DIAGNOSIS — C50411 Malignant neoplasm of upper-outer quadrant of right female breast: Secondary | ICD-10-CM | POA: Insufficient documentation

## 2019-11-27 DIAGNOSIS — K219 Gastro-esophageal reflux disease without esophagitis: Secondary | ICD-10-CM | POA: Diagnosis not present

## 2019-11-27 DIAGNOSIS — Z9013 Acquired absence of bilateral breasts and nipples: Secondary | ICD-10-CM | POA: Insufficient documentation

## 2019-11-27 DIAGNOSIS — Z79899 Other long term (current) drug therapy: Secondary | ICD-10-CM | POA: Diagnosis not present

## 2019-11-27 DIAGNOSIS — E669 Obesity, unspecified: Secondary | ICD-10-CM | POA: Insufficient documentation

## 2019-11-27 DIAGNOSIS — L509 Urticaria, unspecified: Secondary | ICD-10-CM

## 2019-11-27 DIAGNOSIS — R945 Abnormal results of liver function studies: Secondary | ICD-10-CM | POA: Diagnosis not present

## 2019-11-27 DIAGNOSIS — M8580 Other specified disorders of bone density and structure, unspecified site: Secondary | ICD-10-CM | POA: Diagnosis not present

## 2019-11-27 LAB — COMPREHENSIVE METABOLIC PANEL
ALT: 98 U/L — ABNORMAL HIGH (ref 0–44)
AST: 58 U/L — ABNORMAL HIGH (ref 15–41)
Albumin: 4.2 g/dL (ref 3.5–5.0)
Alkaline Phosphatase: 93 U/L (ref 38–126)
Anion gap: 8 (ref 5–15)
BUN: 9 mg/dL (ref 6–20)
CO2: 26 mmol/L (ref 22–32)
Calcium: 9.5 mg/dL (ref 8.9–10.3)
Chloride: 102 mmol/L (ref 98–111)
Creatinine, Ser: 0.67 mg/dL (ref 0.44–1.00)
GFR calc Af Amer: >60 mL/min (ref >60)
GFR calc non Af Amer: >60 mL/min (ref >60)
Glucose, Bld: 121 mg/dL — ABNORMAL HIGH (ref 70–99)
Potassium: 4 mmol/L (ref 3.5–5.1)
Sodium: 136 mmol/L (ref 135–145)
Total Bilirubin: 0.6 mg/dL (ref 0.3–1.2)
Total Protein: 7.3 g/dL (ref 6.5–8.1)

## 2019-11-27 LAB — CBC WITH DIFFERENTIAL/PLATELET
Abs Immature Granulocytes: 0.04 10*3/uL (ref 0.00–0.07)
Basophils Absolute: 0.1 10*3/uL (ref 0.0–0.1)
Basophils Relative: 1 %
Eosinophils Absolute: 0.1 10*3/uL (ref 0.0–0.5)
Eosinophils Relative: 2 %
HCT: 43 % (ref 36.0–46.0)
Hemoglobin: 14.6 g/dL (ref 12.0–15.0)
Immature Granulocytes: 1 %
Lymphocytes Relative: 36 %
Lymphs Abs: 2 10*3/uL (ref 0.7–4.0)
MCH: 30 pg (ref 26.0–34.0)
MCHC: 34 g/dL (ref 30.0–36.0)
MCV: 88.3 fL (ref 80.0–100.0)
Monocytes Absolute: 0.5 10*3/uL (ref 0.1–1.0)
Monocytes Relative: 10 %
Neutro Abs: 2.8 10*3/uL (ref 1.7–7.7)
Neutrophils Relative %: 50 %
Platelets: 222 10*3/uL (ref 150–400)
RBC: 4.87 MIL/uL (ref 3.87–5.11)
RDW: 12.2 % (ref 11.5–15.5)
WBC: 5.5 10*3/uL (ref 4.0–10.5)
nRBC: 0 % (ref 0.0–0.2)

## 2019-11-27 LAB — HEPATITIS PANEL, ACUTE
HCV Ab: NONREACTIVE
Hep A IgM: NONREACTIVE
Hep B C IgM: NONREACTIVE
Hepatitis B Surface Ag: NONREACTIVE

## 2019-11-27 LAB — HEPATITIS B CORE ANTIBODY, TOTAL: Hep B Core Total Ab: NONREACTIVE

## 2019-11-27 NOTE — Progress Notes (Signed)
Pt here for follow up. No new concerns,  No new breast problems. Medication list updated with patient.

## 2019-11-28 LAB — CANCER ANTIGEN 27.29: CA 27.29: 26 U/mL (ref 0.0–38.6)

## 2019-11-29 ENCOUNTER — Encounter: Payer: Self-pay | Admitting: Hematology and Oncology

## 2019-12-08 ENCOUNTER — Ambulatory Visit: Payer: BLUE CROSS/BLUE SHIELD | Admitting: Hematology and Oncology

## 2019-12-08 ENCOUNTER — Other Ambulatory Visit: Payer: BLUE CROSS/BLUE SHIELD

## 2019-12-08 NOTE — Progress Notes (Signed)
[facility]  [address], [address] [place], [place] [address] Phone: [phone]  Fax: [phone]   Clinic Day:  12/11/2019  Referring physician: Alba Cory, MD  Chief Complaint: Terri Tanner is a 59 y.o. female with stage IA right breast cancer s/p mastectomy who is seen for a 2 week assessment.  HPI: The patient was last seen in the medical oncology clinic on 11/27/2019. At that time, she was doing well.  She discontinued some of her supplements secondary to nausea and concern that her elevated LFTs were secondary to her supplements.  She had been taking 40 pills/day.  Hematocrit was 43.0, hemoglobin 14.6, platelets 222,000, WBC 5,500. AST was 58 and ALT 98.  Hepatitis A IgM, hepatitis B core antibody, hepatitis B surface antigen, hepatitis B core IgM, and hepatitis C antibody were negative. CA 27.29 was 26.0.   During the interim, the patient was doing well. The past 10 days she has stopped all her supplemental medications. She does not feel any different. Patient is active. She stopped drinking kombucha tea due to trend in increasing LFTs along with some information she read in an article.  Nausea and heartburn have resolved. She continues to have itching on her arms due to hives. She is using lotion and topical cream for relief. She has occasional back pain on the same side as her mastectomy.    Past Medical History:  Diagnosis Date  . Allergy   . Diabetes mellitus type 2 in obese (HCC) 02/06/2018  . Environmental and seasonal allergies   . GERD (gastroesophageal reflux disease)     Past Surgical History:  Procedure Laterality Date  . CERVICAL POLYPECTOMY    . CESAREAN SECTION    . COLONOSCOPY WITH PROPOFOL N/A 03/03/2018   Procedure: COLONOSCOPY WITH PROPOFOL;  Surgeon: Wyline Mood, MD;  Location: [facility] ENDOSCOPY;  Service: Gastroenterology;  Laterality: N/A;  . SENTINEL NODE BIOPSY Right 05/05/2019   Procedure: SENTINEL NODE BIOPSY;  Surgeon:  Earline Mayotte, MD;  Location: [facility] ORS;  Service: General;  Laterality: Right;  . SIMPLE MASTECTOMY WITH AXILLARY SENTINEL NODE BIOPSY Right 05/05/2019   Procedure: SIMPLE MASTECTOMY;  Surgeon: Earline Mayotte, MD;  Location: [facility] ORS;  Service: General;  Laterality: Right;  . TRANSANAL EXCISION OF RECTAL MASS N/A 04/14/2018   Procedure: TRANSANAL EXCISION OF RECTAL POLYP;  Surgeon: Leafy Ro, MD;  Location: [facility] ORS;  Service: General;  Laterality: N/A;  . WISDOM TOOTH EXTRACTION    . WISDOM TOOTH EXTRACTION      Family History  Problem Relation Age of Onset  . Lung cancer Mother        smoked for many years  . COPD Mother   . Emphysema Mother   . Esophageal cancer Father        smoker  . Suicidality Father   . Cancer Sister   . Stroke Brother   . Alcohol abuse Brother   . Other Brother        huge smoker  . Diabetes Sister   . Breast cancer Neg Hx     Social History:  reports that she has never smoked. She has never used smokeless tobacco. She reports previous alcohol use. She reports that she does not use drugs. She denies any exposure to radiation or toxins. She lives with her daughter and grandchildren in [place]. She moved in July. 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 The patient is alone today.   Allergies: No Known Allergies  Current Medications:  Current Outpatient Medications  Medication Sig Dispense Refill  . Acetylcysteine (NAC PO) Take 2,400 mg/day by mouth.    . Ascorbic Acid (VITAMIN C) 1000 MG tablet Take 1,000 mg by mouth daily. 4000 mg/day    . Barberry-Oreg Grape-Goldenseal (BERBERINE COMPLEX PO) Take by mouth.    . Cholecalciferol (VITAMIN D3 PO) Take 7,500 Units/day by mouth.    . Digestive Enzymes (BETAINE HCL PO) Take by mouth.    . Digestive Enzymes (DIGESTIVE ENZYME PO) Take by mouth.    . EPINEPHrine 0.3 mg/0.3 mL IJ SOAJ injection Inject 0.3 mLs (0.3 mg total) into the muscle as needed for anaphylaxis. 2 each 0  . famotidine (PEPCID)  40 MG tablet Take 40 mg by mouth daily.    . hydrOXYzine (ATARAX/VISTARIL) 25 MG tablet Take 25 mg by mouth 3 (three) times daily as needed.    Marland Kitchen levocetirizine (XYZAL) 5 MG tablet Take 5 mg by mouth every evening.    Marland Kitchen MAGNESIUM MALATE PO Take by mouth. nightly    . MELATONIN PO Take 20 mg by mouth. nightly    . Menaquinone-7 (VITAMIN K2 PO) Take 150 mcg/day by mouth.    . Methylsulfonylmethane (MSM PO) Take 4,000 mg/day by mouth.    . Multiple Vitamin (MULTIVITAMIN) tablet Take 1 tablet by mouth daily.    . NON FORMULARY Liposomal glutathione '250mg'$ /day    . Nutritional Supplements (CALCIUM D-GLUCARATE PO) Take 150 mg by mouth every 30 (thirty) days. (DGL)    . Omega-3 Fatty Acids (SUPER OMEGA 3 EPA/DHA PO) Take by mouth.    Marland Kitchen OVER THE COUNTER MEDICATION ESTROGEN CONTROL    . pravastatin (PRAVACHOL) 10 MG tablet Take 10 mg by mouth at bedtime.     No current facility-administered medications for this visit.    Review of Systems  Constitutional: Negative.  Negative for chills, diaphoresis, fever, malaise/fatigue and weight loss (stable).       Doing well.  Active.  HENT: Negative.  Negative for congestion, ear discharge, ear pain, hearing loss, nosebleeds, sinus pain and sore throat.   Eyes: Negative.  Negative for blurred vision, double vision and photophobia.  Respiratory: Negative.  Negative for cough, hemoptysis, sputum production, shortness of breath and wheezing.   Cardiovascular: Negative.  Negative for chest pain, palpitations, orthopnea, leg swelling and PND.  Gastrointestinal: Negative.  Negative for abdominal pain, blood in stool, constipation, diarrhea, heartburn, melena, nausea and vomiting.  Genitourinary: Negative.  Negative for dysuria, frequency and urgency.  Musculoskeletal: Positive for back pain (occasional, side of mastectomy). Negative for joint pain, myalgias and neck pain.  Skin: Positive for itching (arms; related to hives). Negative for rash.       Hives every  morning.  Neurological: Negative for dizziness, tingling, sensory change, speech change, focal weakness, loss of consciousness, weakness and headaches.  Endo/Heme/Allergies: Negative.  Does not bruise/bleed easily.  Psychiatric/Behavioral: Negative.  Negative for depression and memory loss. The patient is not nervous/anxious and does not have insomnia.   All other systems reviewed and are negative.  Performance status (ECOG): 1  Vitals Blood pressure 129/82, pulse 82, temperature (!) 96 F (35.6 C), temperature source Tympanic, resp. rate 16, weight 166 lb 10.7 oz (75.6 kg), SpO2 100 %.   Physical Exam Vitals and nursing note reviewed.  Constitutional:      General: She is not in acute distress.    Appearance: She is well-developed. She is not diaphoretic.  HENT:     Head: Normocephalic and atraumatic.  Mouth/Throat:     Pharynx: No oropharyngeal exudate.  Eyes:     General: No scleral icterus.    Conjunctiva/sclera: Conjunctivae normal.     Pupils: Pupils are equal, round, and reactive to light.     Comments: Glasses.  Brown eyes.  Neck:     Vascular: No JVD.  Cardiovascular:     Rate and Rhythm: Normal rate and regular rhythm.     Heart sounds: Normal heart sounds. No murmur heard.   Pulmonary:     Effort: Pulmonary effort is normal. No respiratory distress.     Breath sounds: Normal breath sounds. No wheezing or rales.  Chest:     Chest wall: No tenderness.  Abdominal:     General: Bowel sounds are normal. There is no distension.     Palpations: Abdomen is soft. There is no mass.     Tenderness: There is no abdominal tenderness. There is no guarding or rebound.  Musculoskeletal:        General: No tenderness. Normal range of motion.     Cervical back: Normal range of motion and neck supple.  Lymphadenopathy:     Head:     Right side of head: No preauricular, posterior auricular or occipital adenopathy.     Left side of head: No preauricular, posterior auricular or  occipital adenopathy.     Cervical: No cervical adenopathy.     Upper Body:     Right upper body: No supraclavicular adenopathy.     Left upper body: No supraclavicular adenopathy.  Skin:    General: Skin is warm and dry.     Coloration: Skin is not pale.  Neurological:     Mental Status: She is alert and oriented to person, place, and time.  Psychiatric:        Behavior: Behavior normal.        Thought Content: Thought content normal.        Judgment: Judgment normal.    No visits with results within 3 Day(s) from this visit.  Latest known visit with results is:  Appointment on 11/27/2019  Component Date Value Ref Range Status  . CA 27.29 11/27/2019 26.0  0.0 - 38.6 U/mL Final   Comment: (NOTE) Siemens Centaur Immunochemiluminometric Methodology [facility]) Values obtained with different assay methods or kits cannot be used interchangeably. Results cannot be interpreted as absolute evidence of the presence or absence of malignant disease. Performed At: [facility] [place], [place] [id number] Rush Farmer MD BL:[id number]   . Sodium 11/27/2019 136  135 - 145 mmol/L Final  . Potassium 11/27/2019 4.0  3.5 - 5.1 mmol/L Final  . Chloride 11/27/2019 102  98 - 111 mmol/L Final  . CO2 11/27/2019 26  22 - 32 mmol/L Final  . Glucose, Bld 11/27/2019 121* 70 - 99 mg/dL Final   Glucose reference range applies only to samples taken after fasting for at least 8 hours.  . BUN 11/27/2019 9  6 - 20 mg/dL Final  . Creatinine, Ser 11/27/2019 0.67  0.44 - 1.00 mg/dL Final  . Calcium 11/27/2019 9.5  8.9 - 10.3 mg/dL Final  . Total Protein 11/27/2019 7.3  6.5 - 8.1 g/dL Final  . Albumin 11/27/2019 4.2  3.5 - 5.0 g/dL Final  . AST 11/27/2019 58* 15 - 41 U/L Final  . ALT 11/27/2019 98* 0 - 44 U/L Final  . Alkaline Phosphatase 11/27/2019 93  38 - 126 U/L Final  . Total Bilirubin 11/27/2019 0.6  0.3 -  1.2 mg/dL Final  . GFR calc non Af Amer 11/27/2019 >60  >60 mL/min Final  .  GFR calc Af Amer 11/27/2019 >60  >60 mL/min Final  . Anion gap 11/27/2019 8  5 - 15 Final   Performed at [facility], [address], [place], [place] [address]  . WBC 11/27/2019 5.5  4.0 - 10.5 K/uL Final  . RBC 11/27/2019 4.87  3.87 - 5.11 MIL/uL Final  . Hemoglobin 11/27/2019 14.6  12.0 - 15.0 g/dL Final  . HCT 11/27/2019 43.0  36 - 46 % Final  . MCV 11/27/2019 88.3  80.0 - 100.0 fL Final  . MCH 11/27/2019 30.0  26.0 - 34.0 pg Final  . MCHC 11/27/2019 34.0  30.0 - 36.0 g/dL Final  . RDW 11/27/2019 12.2  11.5 - 15.5 % Final  . Platelets 11/27/2019 222  150 - 400 K/uL Final  . nRBC 11/27/2019 0.0  0.0 - 0.2 % Final  . Neutrophils Relative % 11/27/2019 50  % Final  . Neutro Abs 11/27/2019 2.8  1.7 - 7.7 K/uL Final  . Lymphocytes Relative 11/27/2019 36  % Final  . Lymphs Abs 11/27/2019 2.0  0.7 - 4.0 K/uL Final  . Monocytes Relative 11/27/2019 10  % Final  . Monocytes Absolute 11/27/2019 0.5  0 - 1 K/uL Final  . Eosinophils Relative 11/27/2019 2  % Final  . Eosinophils Absolute 11/27/2019 0.1  0 - 0 K/uL Final  . Basophils Relative 11/27/2019 1  % Final  . Basophils Absolute 11/27/2019 0.1  0 - 0 K/uL Final  . Immature Granulocytes 11/27/2019 1  % Final  . Abs Immature Granulocytes 11/27/2019 0.04  0.00 - 0.07 K/uL Final   Performed at [facility], [address] [place], [place] [address]  . Hep B Core Total Ab 11/27/2019 NON REACTIVE  NON REACTIVE Final   Performed at [facility], [place] [address] [place], [place] [address]  . Hepatitis B Surface Ag 11/27/2019 NON REACTIVE  NON REACTIVE Final  . HCV Ab 11/27/2019 NON REACTIVE  NON REACTIVE Final   Comment: (NOTE) Nonreactive HCV antibody screen is consistent with no HCV infections,  unless recent infection is suspected or other evidence exists to indicate HCV infection.   . Hep A IgM 11/27/2019 NON REACTIVE  NON REACTIVE Final  . Hep B C IgM 11/27/2019 NON REACTIVE  NON REACTIVE Final    Performed at [facility], [place] [address] [place], [place] [address]    Assessment:  Terri Tanner is a 59 y.o. female withstage IA right breast cancers/p right simple mastectomy and sentinel node biopsy on 05/05/2019.  Pathology revealed a 1.3 x 1.1 x 0.8 cm grade I invasive mammary carcinoma, no special type. Three sentinel lymph nodes were negative.  Tumor was ER positive (>90%). PR positive (40%) and HER2/neu negative.  Pathologic stage was pT1c pN0.  Oncotype DX revealed < 1% benefit of chemotherapy.  She declined endocrine therapy.  She takes many supplements.  CA27.29 was 13.9 on 04/13/2019, 21.9 on 08/22/2019 and 26 on 11/27/2019.  Bilateraldiagnosticmammogramon 03/24/2019 revealed a suspicious 1.1 cmmass in the right breastat the 11 o'clock position and a 0.5 cm oval circumscribed mass in the upper outer left breast.Targeted ultrasoundof the right breast revealed an irregular 1.3 x 0.8 x 1.1 cm hypoechoic mass at 11 o'clock 4 cm from the nipple. Lymph nodes were unremarkable. Targeted ultrasound of the left breast revealed a cluster of microcysts at  2 o'clock 6 cm from nipple measuring 0.7 x 0.2 x 0.7 cm.  Bone density on 06/08/2019 revealed osteopenia with a T-score of -1.7 in the AP spine L1-L4.   She has a family historyof breast cancer, ovarian cancer, endometrial cancer, brain tumors, and smoking related cancers (lung and head and neck).  [facility] genetic testing on 04/13/2019 was negative.  She has a history ofchronic urticariasince 12/2018 and mild leukocytosissince 02/09/2019. WBChas ranged between 11,100 and 13,600. Differential has been predominantly neutrophils. Etiology is felt reactive. CRP has been elevated.  Work-up on 09/02/2020revealed a hematocrit of 40.8, hemoglobin 13.9, MCV 87.4, platelets 296,000, white count 12,700 with an [place] of10,300. Differentialincluded 80% segs, 13% lymphs, 5% monocytes, and1% eosinophils.ANA was  negative.Tryptasewas 8.5 (2.2-13.2). SPEP revealed no monoclonal protein. IgG was 621, IgA 141,IgM 115, andIgE4 (low).  Peripheralsmearrevealed mild leukocytosis with neutrophilia and minimal left shifted maturation. There is no significant morphologic abnormalities of leukocytes. There was unremarkable RBC morphology and indices. There was normal platelet count and morphology.   She had elevated liver function tests. AST was 58 and ALT 98 on 11/27/2019.  Hepatitis A, B, and C serologies were negative on 11/27/2019  She has chronic urticariaand intermittent angioedema of her lips. Etiology does not appear related to any medications, herbal products, foods, inset bites, infections, or NSAIDs. She has no apparent autoimmune disease.  Symptomatically, nausea and heartburn have improved off supplementation.  LFTs have normalized.  GGT is 55 (7-50).   Plan: 1.    Labs today: LFTs, GGT. 2.    Clinical stage IA right breast cancer Patient is s/p right mastectomy and SLN on 05/05/2019. Oncotype DX testing- < 1% benefit of chemotherapy.             She declined endocrine therapy.  Patient has stopped her supplements secondary to elevated LFTs.  Continue to monitor. 3.Elevated LFTs  AST 58 and ALT 98 on 11/27/2019.  AST 22 and ALT 26 on 12/11/2019.  Patient stopped her supplements between lab draws.  Unclear which supplement(s) are the culprit.  Patient concerned about Kombucha tea.  If patient chooses to add bacj supplements, will need follow-up testing. 4.   Chronic urticaria Etiologyremainsunclear. There were no inciting events. She hasno evidence of a myeloproliferative disorder, paraprotein or mastocytosis. Allergy testing was normal. Continue to monitor. 5.Osteopenia Review interval bone density on 06/08/2019 which revealed osteopenia with a T-score of -1.7 in the  AP spine L1-L4.   Continue calcium 1200 mg a day and vitamin D 800 IU a day.  Discuss consideration of a bisphosphonate or a RANK ligand if bone density declines. 6. RN to call patient with LFTs and discuss planned follow-up. 7.   RTC in 3 months for MD assessment and labs (CBC with diff, CMP, CA27.29).  I discussed the assessment and treatment plan with the patient.  The patient was provided an opportunity to ask questions and all were answered.  The patient agreed with the plan and demonstrated an understanding of the instructions.  The patient was advised to call back if the symptoms worsen or if the condition fails to improve as anticipated.   Lequita Asal, MD, PhD    12/11/2019, 11:33 AM  I, Selena Batten, am acting as a scribe for [employer]. Mike Gip, MD.   I, Melissa C. Mike Gip, MD, have reviewed the above documentation for accuracy and completeness, and I agree with the above.

## 2019-12-09 ENCOUNTER — Other Ambulatory Visit: Payer: Self-pay | Admitting: Hematology and Oncology

## 2019-12-09 DIAGNOSIS — R7989 Other specified abnormal findings of blood chemistry: Secondary | ICD-10-CM

## 2019-12-09 DIAGNOSIS — Z170 Estrogen receptor positive status [ER+]: Secondary | ICD-10-CM

## 2019-12-09 DIAGNOSIS — C50411 Malignant neoplasm of upper-outer quadrant of right female breast: Secondary | ICD-10-CM

## 2019-12-11 ENCOUNTER — Inpatient Hospital Stay (HOSPITAL_BASED_OUTPATIENT_CLINIC_OR_DEPARTMENT_OTHER): Payer: BLUE CROSS/BLUE SHIELD | Admitting: Hematology and Oncology

## 2019-12-11 ENCOUNTER — Encounter: Payer: Self-pay | Admitting: Hematology and Oncology

## 2019-12-11 ENCOUNTER — Other Ambulatory Visit: Payer: Self-pay

## 2019-12-11 ENCOUNTER — Telehealth: Payer: Self-pay

## 2019-12-11 ENCOUNTER — Inpatient Hospital Stay: Payer: BLUE CROSS/BLUE SHIELD

## 2019-12-11 VITALS — BP 129/82 | HR 82 | Temp 96.0°F | Resp 16 | Wt 166.7 lb

## 2019-12-11 DIAGNOSIS — Z170 Estrogen receptor positive status [ER+]: Secondary | ICD-10-CM | POA: Diagnosis not present

## 2019-12-11 DIAGNOSIS — R7989 Other specified abnormal findings of blood chemistry: Secondary | ICD-10-CM

## 2019-12-11 DIAGNOSIS — C50411 Malignant neoplasm of upper-outer quadrant of right female breast: Secondary | ICD-10-CM

## 2019-12-11 LAB — HEPATIC FUNCTION PANEL
ALT: 26 U/L (ref 0–44)
AST: 22 U/L (ref 15–41)
Albumin: 4.4 g/dL (ref 3.5–5.0)
Alkaline Phosphatase: 91 U/L (ref 38–126)
Bilirubin, Direct: <0.1 mg/dL (ref 0.0–0.2)
Total Bilirubin: 0.5 mg/dL (ref 0.3–1.2)
Total Protein: 7.7 g/dL (ref 6.5–8.1)

## 2019-12-11 LAB — GAMMA GT: GGT: 55 U/L — ABNORMAL HIGH (ref 7–50)

## 2019-12-11 NOTE — Progress Notes (Signed)
Patient here for oncology follow-up appointment, expresses complaints of occasional back pain on side of mastectomy.

## 2019-12-16 DIAGNOSIS — R7989 Other specified abnormal findings of blood chemistry: Secondary | ICD-10-CM | POA: Insufficient documentation

## 2019-12-16 DIAGNOSIS — R945 Abnormal results of liver function studies: Secondary | ICD-10-CM | POA: Insufficient documentation

## 2019-12-20 ENCOUNTER — Encounter: Payer: Self-pay | Admitting: Family Medicine

## 2019-12-21 ENCOUNTER — Other Ambulatory Visit: Payer: Self-pay | Admitting: Family Medicine

## 2019-12-21 DIAGNOSIS — E1169 Type 2 diabetes mellitus with other specified complication: Secondary | ICD-10-CM

## 2019-12-22 LAB — COMPLETE METABOLIC PANEL WITH GFR
AG Ratio: 1.6 (calc) (ref 1.0–2.5)
ALT: 50 U/L — ABNORMAL HIGH (ref 6–29)
AST: 35 U/L (ref 10–35)
Albumin: 4.4 g/dL (ref 3.6–5.1)
Alkaline phosphatase (APISO): 106 U/L (ref 37–153)
BUN: 11 mg/dL (ref 7–25)
CO2: 28 mmol/L (ref 20–32)
Calcium: 10.1 mg/dL (ref 8.6–10.4)
Chloride: 102 mmol/L (ref 98–110)
Creat: 0.73 mg/dL (ref 0.50–0.99)
GFR, Est African American: 104 mL/min/{1.73_m2} (ref >=60)
GFR, Est Non African American: 90 mL/min/{1.73_m2} (ref >=60)
Globulin: 2.7 g/dL (calc) (ref 1.9–3.7)
Glucose, Bld: 131 mg/dL — ABNORMAL HIGH (ref 65–99)
Potassium: 4.6 mmol/L (ref 3.5–5.3)
Sodium: 141 mmol/L (ref 135–146)
Total Bilirubin: 0.5 mg/dL (ref 0.2–1.2)
Total Protein: 7.1 g/dL (ref 6.1–8.1)

## 2019-12-22 LAB — LIPID PANEL
Cholesterol: 247 mg/dL — ABNORMAL HIGH (ref <200)
HDL: 44 mg/dL — ABNORMAL LOW (ref >=50)
Non-HDL Cholesterol (Calc): 203 mg/dL (calc) — ABNORMAL HIGH (ref <130)
Total CHOL/HDL Ratio: 5.6 (calc) — ABNORMAL HIGH (ref <5.0)
Triglycerides: 529 mg/dL — ABNORMAL HIGH (ref <150)

## 2019-12-26 ENCOUNTER — Telehealth: Payer: Self-pay

## 2019-12-26 NOTE — Telephone Encounter (Signed)
Spoke with the patient to inform her Per Dr Mike Gip her LFT's has slighlty increase from 12/22/2019. The patient doesn't want a referral at the momoent. She reports she has a appointment with Dr Gwynneth Aliment and has stopped all supplement will f/u with Dr Gwynneth Aliment in 2 weeks for more labs. The patient was understanding and agreeable at this time.

## 2020-01-02 ENCOUNTER — Other Ambulatory Visit: Payer: Self-pay

## 2020-01-02 DIAGNOSIS — C50411 Malignant neoplasm of upper-outer quadrant of right female breast: Secondary | ICD-10-CM

## 2020-01-02 DIAGNOSIS — R7989 Other specified abnormal findings of blood chemistry: Secondary | ICD-10-CM

## 2020-01-05 ENCOUNTER — Other Ambulatory Visit: Payer: Self-pay | Admitting: Family Medicine

## 2020-01-08 LAB — COMPLETE METABOLIC PANEL WITH GFR
AG Ratio: 1.9 (calc) (ref 1.0–2.5)
ALT: 19 U/L (ref 6–29)
AST: 19 U/L (ref 10–35)
Albumin: 4.2 g/dL (ref 3.6–5.1)
Alkaline phosphatase (APISO): 87 U/L (ref 37–153)
BUN: 13 mg/dL (ref 7–25)
CO2: 22 mmol/L (ref 20–32)
Calcium: 9.4 mg/dL (ref 8.6–10.4)
Chloride: 105 mmol/L (ref 98–110)
Creat: 0.68 mg/dL (ref 0.50–0.99)
GFR, Est African American: 110 mL/min/{1.73_m2} (ref >=60)
GFR, Est Non African American: 95 mL/min/{1.73_m2} (ref >=60)
Globulin: 2.2 g/dL (calc) (ref 1.9–3.7)
Glucose, Bld: 119 mg/dL — ABNORMAL HIGH (ref 65–99)
Potassium: 4.1 mmol/L (ref 3.5–5.3)
Sodium: 140 mmol/L (ref 135–146)
Total Bilirubin: 0.4 mg/dL (ref 0.2–1.2)
Total Protein: 6.4 g/dL (ref 6.1–8.1)

## 2020-01-08 LAB — CBC WITH DIFFERENTIAL/PLATELET
Absolute Monocytes: 479 cells/uL (ref 200–950)
Basophils Absolute: 61 cells/uL (ref 0–200)
Basophils Relative: 1.2 %
Eosinophils Absolute: 219 cells/uL (ref 15–500)
Eosinophils Relative: 4.3 %
HCT: 42 % (ref 35.0–45.0)
Hemoglobin: 14.3 g/dL (ref 11.7–15.5)
Lymphs Abs: 2025 cells/uL (ref 850–3900)
MCH: 30.6 pg (ref 27.0–33.0)
MCHC: 34 g/dL (ref 32.0–36.0)
MCV: 89.9 fL (ref 80.0–100.0)
MPV: 11.6 fL (ref 7.5–12.5)
Monocytes Relative: 9.4 %
Neutro Abs: 2315 cells/uL (ref 1500–7800)
Neutrophils Relative %: 45.4 %
Platelets: 224 10*3/uL (ref 140–400)
RBC: 4.67 10*6/uL (ref 3.80–5.10)
RDW: 12 % (ref 11.0–15.0)
Total Lymphocyte: 39.7 %
WBC: 5.1 10*3/uL (ref 3.8–10.8)

## 2020-01-08 LAB — CANCER ANTIGEN 27.29: CA 27.29: 23 U/mL (ref <38)

## 2020-02-08 ENCOUNTER — Encounter: Payer: Self-pay | Admitting: Family Medicine

## 2020-02-11 ENCOUNTER — Other Ambulatory Visit: Payer: Self-pay | Admitting: Family Medicine

## 2020-02-12 ENCOUNTER — Other Ambulatory Visit: Payer: Self-pay | Admitting: Family Medicine

## 2020-02-12 MED ORDER — PRAVASTATIN SODIUM 20 MG PO TABS: 20.0000 mg | ORAL_TABLET | Freq: Every day | ORAL | 0 refills | Status: DC

## 2020-02-27 ENCOUNTER — Other Ambulatory Visit: Payer: Self-pay | Admitting: General Surgery

## 2020-02-27 DIAGNOSIS — Z1211 Encounter for screening for malignant neoplasm of colon: Secondary | ICD-10-CM

## 2020-03-04 ENCOUNTER — Other Ambulatory Visit: Payer: Self-pay

## 2020-03-04 DIAGNOSIS — C50411 Malignant neoplasm of upper-outer quadrant of right female breast: Secondary | ICD-10-CM

## 2020-03-05 ENCOUNTER — Ambulatory Visit: Payer: BLUE CROSS/BLUE SHIELD | Admitting: Hematology and Oncology

## 2020-03-05 ENCOUNTER — Telehealth: Payer: Self-pay | Admitting: Hematology and Oncology

## 2020-03-05 ENCOUNTER — Other Ambulatory Visit: Payer: BLUE CROSS/BLUE SHIELD

## 2020-03-05 ENCOUNTER — Inpatient Hospital Stay: Payer: BLUE CROSS/BLUE SHIELD

## 2020-03-05 ENCOUNTER — Inpatient Hospital Stay: Payer: BLUE CROSS/BLUE SHIELD | Admitting: Hematology and Oncology

## 2020-03-05 NOTE — Telephone Encounter (Signed)
I got word from triage this patient was exposed to Covid 19 virus. I moved her appt out 2 weeks and asked her to call and confirm appt on 9/28 @ 9 am.

## 2020-03-18 NOTE — Progress Notes (Signed)
[facility]  [address], [address] [place], [place] [address] Phone: [phone]  Fax: [phone]   Clinic Day:  03/19/2020  Referring physician: Steele Sizer, MD  Chief Complaint: Terri Tanner is a 59 y.o. female with stage IA right breast cancer s/p mastectomy who is seen for 3 month assessment.  HPI: The patient was last seen in the medical oncology clinic on 12/11/2019. At that time, her nausea and heartburn had improved off supplementation. LFTs had normalized. GGT was 55 (7-50).  Labs on 01/05/2020 revealed a hematocrit of 42.0, hemoglobin 14.3, platelets 224,000, WBC 5,100. CMP was normal. CA27.29 was 23.  Screening mammogram is scheduled for 03/29/2020.  During the interim, she has felt "great."  Her back pain comes and goes. Her hives resolved around 02/09/2020. She performs monthly breast exams and has noticed a few nodules in her left axilla that come and go.  She believes that the [place] she was making at home was causing her symptoms. She has reintroduced some other supplements into her diet.   Past Medical History:  Diagnosis Date  . Allergy   . Diabetes mellitus type 2 in obese ([place]) 02/06/2018  . Environmental and seasonal allergies   . GERD (gastroesophageal reflux disease)     Past Surgical History:  Procedure Laterality Date  . CERVICAL POLYPECTOMY    . CESAREAN SECTION    . COLONOSCOPY WITH PROPOFOL N/A 03/03/2018   Procedure: COLONOSCOPY WITH PROPOFOL;  Surgeon: Jonathon Bellows, MD;  Location: [facility] ENDOSCOPY;  Service: Gastroenterology;  Laterality: N/A;  . SENTINEL NODE BIOPSY Right 05/05/2019   Procedure: SENTINEL NODE BIOPSY;  Surgeon: Robert Bellow, MD;  Location: [facility] ORS;  Service: General;  Laterality: Right;  . SIMPLE MASTECTOMY WITH AXILLARY SENTINEL NODE BIOPSY Right 05/05/2019   Procedure: SIMPLE MASTECTOMY;  Surgeon: Robert Bellow, MD;  Location: [facility] ORS;  Service: General;  Laterality: Right;  .  TRANSANAL EXCISION OF RECTAL MASS N/A 04/14/2018   Procedure: TRANSANAL EXCISION OF RECTAL POLYP;  Surgeon: Jules Husbands, MD;  Location: [facility] ORS;  Service: General;  Laterality: N/A;  . WISDOM TOOTH EXTRACTION    . WISDOM TOOTH EXTRACTION      Family History  Problem Relation Age of Onset  . Lung cancer Mother        smoked for many years  . COPD Mother   . Emphysema Mother   . Esophageal cancer Father        smoker  . Suicidality Father   . Cancer Sister   . Stroke Brother   . Alcohol abuse Brother   . Other Brother        huge smoker  . Diabetes Sister   . Breast cancer Neg Hx     Social History:  reports that she has never smoked. She has never used smokeless tobacco. She reports previous alcohol use. She reports that she does not use drugs. She denies any exposure to radiation or toxins. She lives with her daughter and grandchildren in [place]. She moved in July. 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 The patient is alone today.   Allergies: No Known Allergies  Current Medications: Current Outpatient Medications  Medication Sig Dispense Refill  . Acetylcysteine (NAC PO) Take 2,400 mg/day by mouth.    . Ascorbic Acid (VITAMIN C) 1000 MG tablet Take 1,000 mg by mouth daily. 4000 mg/day    . Barberry-Oreg Grape-Goldenseal (BERBERINE COMPLEX PO) Take by mouth.    . Cholecalciferol (VITAMIN D3 PO) Take  7,500 Units/day by mouth.    . Digestive Enzymes (BETAINE HCL PO) Take by mouth.    . Digestive Enzymes (DIGESTIVE ENZYME PO) Take by mouth.    . EPINEPHrine 0.3 mg/0.3 mL IJ SOAJ injection Inject 0.3 mLs (0.3 mg total) into the muscle as needed for anaphylaxis. 2 each 0  . levocetirizine (XYZAL) 5 MG tablet Take 5 mg by mouth every evening.    Marland Kitchen MAGNESIUM MALATE PO Take by mouth. nightly    . MELATONIN PO Take 20 mg by mouth. nightly    . Menaquinone-7 (VITAMIN K2 PO) Take 150 mcg/day by mouth.    . Methylsulfonylmethane (MSM PO) Take 4,000 mg/day by mouth.    . Multiple  Vitamin (MULTIVITAMIN) tablet Take 1 tablet by mouth daily.    . NON FORMULARY Liposomal glutathione 285m/day    . Nutritional Supplements (CALCIUM D-GLUCARATE PO) Take 150 mg by mouth every 30 (thirty) days. (DGL)    . Omega-3 Fatty Acids (SUPER OMEGA 3 EPA/DHA PO) Take by mouth.    .Marland KitchenOVER THE COUNTER MEDICATION ESTROGEN CONTROL    . pravastatin (PRAVACHOL) 20 MG tablet Take 1 tablet (20 mg total) by mouth at bedtime. 90 tablet 0  . famotidine (PEPCID) 40 MG tablet Take 40 mg by mouth daily. (Patient not taking: Reported on 03/19/2020)    . hydrOXYzine (ATARAX/VISTARIL) 25 MG tablet Take 25 mg by mouth 3 (three) times daily as needed. (Patient not taking: Reported on 03/19/2020)     No current facility-administered medications for this visit.    Review of Systems  Constitutional: Positive for weight loss (4 lbs). Negative for chills, diaphoresis, fever and malaise/fatigue.       Feels "great."  HENT: Negative.  Negative for congestion, ear discharge, ear pain, hearing loss, nosebleeds, sinus pain, sore throat and tinnitus.   Eyes: Negative.  Negative for blurred vision, double vision and photophobia.  Respiratory: Negative.  Negative for cough, hemoptysis, sputum production, shortness of breath and wheezing.   Cardiovascular: Negative.  Negative for chest pain, palpitations, orthopnea, leg swelling and PND.  Gastrointestinal: Negative.  Negative for abdominal pain, blood in stool, constipation, diarrhea, heartburn, melena, nausea and vomiting.  Genitourinary: Negative.  Negative for dysuria, frequency, hematuria and urgency.  Musculoskeletal: Positive for back pain (comes and goes, side of mastectomy). Negative for joint pain, myalgias and neck pain.       Nodules in left axilla, come and go.  Skin: Negative.  Negative for itching and rash.  Neurological: Negative for dizziness, tingling, tremors, sensory change, speech change, focal weakness, loss of consciousness, weakness and headaches.   Endo/Heme/Allergies: Negative.  Does not bruise/bleed easily.  Psychiatric/Behavioral: Negative.  Negative for depression and memory loss. The patient is not nervous/anxious and does not have insomnia.   All other systems reviewed and are negative.  Performance status (ECOG): 0  Vitals Blood pressure 126/77, pulse 88, temperature (!) 97 F (36.1 C), temperature source Tympanic, resp. rate 16, weight 162 lb 0.6 oz (73.5 kg), SpO2 99 %.   Physical Exam Vitals and nursing note reviewed.  Constitutional:      General: She is not in acute distress.    Appearance: She is well-developed. She is not diaphoretic.  HENT:     Head: Normocephalic and atraumatic.     Comments: Long brown hair.    Mouth/Throat:     Pharynx: No oropharyngeal exudate.  Eyes:     General: No scleral icterus.    Conjunctiva/sclera: Conjunctivae normal.  Pupils: Pupils are equal, round, and reactive to light.     Comments: Glasses.  Brown eyes.  Neck:     Vascular: No JVD.  Cardiovascular:     Rate and Rhythm: Normal rate and regular rhythm.     Heart sounds: Normal heart sounds. No murmur heard.   Pulmonary:     Effort: Pulmonary effort is normal. No respiratory distress.     Breath sounds: Normal breath sounds. No wheezing or rales.  Chest:     Chest wall: No tenderness.     Breasts:        Right: Absent.        Left: Skin change (fibrocystic changes at 3 o'clock, 5 cm from nipple) and tenderness (3 o'clock) present.  Abdominal:     General: Bowel sounds are normal. There is no distension.     Palpations: Abdomen is soft. There is no hepatomegaly, splenomegaly or mass.     Tenderness: There is no abdominal tenderness. There is no guarding or rebound.  Musculoskeletal:        General: Tenderness (right upper back) present. Normal range of motion.     Cervical back: Normal range of motion and neck supple.  Lymphadenopathy:     Head:     Right side of head: No preauricular, posterior auricular or  occipital adenopathy.     Left side of head: No preauricular, posterior auricular or occipital adenopathy.     Cervical: No cervical adenopathy.     Upper Body:     Right upper body: No supraclavicular or axillary adenopathy.     Left upper body: No supraclavicular or axillary adenopathy.     Lower Body: No right inguinal adenopathy.  Skin:    General: Skin is warm and dry.     Coloration: Skin is not pale.  Neurological:     Mental Status: She is alert and oriented to person, place, and time.  Psychiatric:        Behavior: Behavior normal.        Thought Content: Thought content normal.        Judgment: Judgment normal.    Appointment on 03/19/2020  Component Date Value Ref Range Status  . Sodium 03/19/2020 138  135 - 145 mmol/L Final  . Potassium 03/19/2020 4.1  3.5 - 5.1 mmol/L Final  . Chloride 03/19/2020 102  98 - 111 mmol/L Final  . CO2 03/19/2020 26  22 - 32 mmol/L Final  . Glucose, Bld 03/19/2020 122* 70 - 99 mg/dL Final   Glucose reference range applies only to samples taken after fasting for at least 8 hours.  . BUN 03/19/2020 12  6 - 20 mg/dL Final  . Creatinine, Ser 03/19/2020 0.64  0.44 - 1.00 mg/dL Final  . Calcium 03/19/2020 9.5  8.9 - 10.3 mg/dL Final  . Total Protein 03/19/2020 7.5  6.5 - 8.1 g/dL Final  . Albumin 03/19/2020 4.6  3.5 - 5.0 g/dL Final  . AST 03/19/2020 32  15 - 41 U/L Final  . ALT 03/19/2020 42  0 - 44 U/L Final  . Alkaline Phosphatase 03/19/2020 94  38 - 126 U/L Final  . Total Bilirubin 03/19/2020 0.8  0.3 - 1.2 mg/dL Final  . GFR calc non Af Amer 03/19/2020 >60  >60 mL/min Final  . GFR calc Af Amer 03/19/2020 >60  >60 mL/min Final  . Anion gap 03/19/2020 10  5 - 15 Final   Performed at [facility], [place]  [address], [place] [address]  . WBC 03/19/2020 5.5  4.0 - 10.5 K/uL Final  . RBC 03/19/2020 5.00  3.87 - 5.11 MIL/uL Final  . Hemoglobin 03/19/2020 15.0  12.0 - 15.0 g/dL Final  . HCT 03/19/2020 43.8  36 - 46 % Final   . MCV 03/19/2020 87.6  80.0 - 100.0 fL Final  . MCH 03/19/2020 30.0  26.0 - 34.0 pg Final  . MCHC 03/19/2020 34.2  30.0 - 36.0 g/dL Final  . RDW 03/19/2020 12.0  11.5 - 15.5 % Final  . Platelets 03/19/2020 258  150 - 400 K/uL Final  . nRBC 03/19/2020 0.0  0.0 - 0.2 % Final  . Neutrophils Relative % 03/19/2020 59  % Final  . Neutro Abs 03/19/2020 3.2  1.7 - 7.7 K/uL Final  . Lymphocytes Relative 03/19/2020 29  % Final  . Lymphs Abs 03/19/2020 1.6  0.7 - 4.0 K/uL Final  . Monocytes Relative 03/19/2020 9  % Final  . Monocytes Absolute 03/19/2020 0.5  0 - 1 K/uL Final  . Eosinophils Relative 03/19/2020 2  % Final  . Eosinophils Absolute 03/19/2020 0.1  0 - 0 K/uL Final  . Basophils Relative 03/19/2020 1  % Final  . Basophils Absolute 03/19/2020 0.1  0 - 0 K/uL Final  . Immature Granulocytes 03/19/2020 0  % Final  . Abs Immature Granulocytes 03/19/2020 0.01  0.00 - 0.07 K/uL Final   Performed at [facility], [address] [place], [place] [address]    Assessment:  Terri Tanner is a 59 y.o. female withstage IA right breast cancers/p right simple mastectomy and sentinel node biopsy on 05/05/2019.  Pathology revealed a 1.3 x 1.1 x 0.8 cm grade I invasive mammary carcinoma, no special type. Three sentinel lymph nodes were negative.  Tumor was ER positive (>90%). PR positive (40%) and HER2/neu negative.  Pathologic stage was pT1c pN0.  Oncotype DX revealed < 1% benefit of chemotherapy.  She declined endocrine therapy.  She takes many supplements.  CA27.29 was 13.9 on 04/13/2019, 21.9 on 08/22/2019, 26 on 11/27/2019, 23 on 01/05/2020, and 17.8 on 03/19/2020.  Bilateraldiagnosticmammogramon 03/24/2019 revealed a suspicious 1.1 cmmass in the right breastat the 11 o'clock position and a 0.5 cm oval circumscribed mass in the upper outer left breast.Targeted ultrasoundof the right breast revealed an irregular 1.3 x 0.8 x 1.1 cm hypoechoic mass at 11 o'clock 4 cm from the  nipple. Lymph nodes were unremarkable. Targeted ultrasound of the left breast revealed a cluster of microcysts at 2 o'clock 6 cm from nipple measuring 0.7 x 0.2 x 0.7 cm.  Bone density on 06/08/2019 revealed osteopenia with a T-score of -1.7 in the AP spine L1-L4.   She has a family historyof breast cancer, ovarian cancer, endometrial cancer, brain tumors, and smoking related cancers (lung and head and neck).  [facility] genetic testing on 04/13/2019 was negative.  She has a history ofchronic urticariasince 12/2018 and mild leukocytosissince 02/09/2019. WBChas ranged between 11,100 and 13,600. Differential has been predominantly neutrophils. Etiology is felt reactive. CRP has been elevated.  Work-up on 09/02/2020revealed a hematocrit of 40.8, hemoglobin 13.9, MCV 87.4, platelets 296,000, white count 12,700 with an [place] of10,300. Differentialincluded 80% segs, 13% lymphs, 5% monocytes, and1% eosinophils.ANA was negative.Tryptasewas 8.5 (2.2-13.2). SPEP revealed no monoclonal protein. IgG was 621, IgA 141,IgM 115, andIgE4 (low).  Peripheralsmearrevealed mild leukocytosis with neutrophilia and minimal left shifted maturation. There is no significant morphologic abnormalities of leukocytes. There was unremarkable RBC morphology and indices.  There was normal platelet count and morphology.   She had elevated liver function tests. AST was 58 and ALT 98 on 11/27/2019.  Hepatitis A, B, and C serologies were negative on 11/27/2019  She has chronic urticariaand intermittent angioedema of her lips. Etiology does not appear related to any medications, herbal products, foods, inset bites, infections, or NSAIDs. She has no apparent autoimmune disease.  She has not received the COVID-19 vaccine.  Symptomatically, she feels "great".  LFTs have resolved off of [place].  Exam is stable.   Plan: 1.    Labs today: CBC with diff, CMP, CA27.29. 2.    Clinical stage IA right  breast cancer Patient is s/p right mastectomy and SLN on 05/05/2019. Oncotype DX testing- < 1% benefit of chemotherapy.             She declined endocrine therapy.  Patient has stopped her supplements secondary to elevated LFTs.   LFTs resolved off of [place].  Continue to monitor. 3.Elevated LFTs  AST 58 and ALT 98 on 11/27/2019.  AST 22 and ALT 26 on 12/11/2019.  AST 32 and ALT 42 on 03/19/2020.  Patient relates elevated LFTs to her Kombucha tea.  Continue to monitor. 4.   Chronic urticaria Patient notes hives went away around 02/09/2020.  Etiology may have been due to supplements. 5.Osteopenia Bone density on 06/08/2019 revealed osteopenia with a T-score of -1.7 in the AP spine L1-L4.   Continue calcium and vitamin D.  Discuss consideration of a bisphosphonate or a RANK ligand if bone density declines. 6.   RTC in 4 months for MD assessment and labs (CBC with diff, CMP, CA 27.29).  I discussed the assessment and treatment plan with the patient.  The patient was provided an opportunity to ask questions and all were answered.  The patient agreed with the plan and demonstrated an understanding of the instructions.  The patient was advised to call back if the symptoms worsen or if the condition fails to improve as anticipated.   Lequita Asal, MD, PhD    03/19/2020, 10:02 AM  I, Mirian Mo Tufford, am acting as a Education administrator for [employer]. Mike Gip, MD.   I, Lauralie Blacksher C. Mike Gip, MD, have reviewed the above documentation for accuracy and completeness, and I agree with the above.

## 2020-03-19 ENCOUNTER — Inpatient Hospital Stay (HOSPITAL_BASED_OUTPATIENT_CLINIC_OR_DEPARTMENT_OTHER): Payer: BLUE CROSS/BLUE SHIELD | Admitting: Hematology and Oncology

## 2020-03-19 ENCOUNTER — Other Ambulatory Visit: Payer: Self-pay

## 2020-03-19 ENCOUNTER — Encounter: Payer: Self-pay | Admitting: Hematology and Oncology

## 2020-03-19 ENCOUNTER — Inpatient Hospital Stay: Payer: BLUE CROSS/BLUE SHIELD | Attending: Hematology and Oncology

## 2020-03-19 VITALS — BP 126/77 | HR 88 | Temp 97.0°F | Resp 16 | Wt 162.0 lb

## 2020-03-19 DIAGNOSIS — M8580 Other specified disorders of bone density and structure, unspecified site: Secondary | ICD-10-CM | POA: Insufficient documentation

## 2020-03-19 DIAGNOSIS — C50911 Malignant neoplasm of unspecified site of right female breast: Secondary | ICD-10-CM | POA: Insufficient documentation

## 2020-03-19 DIAGNOSIS — L509 Urticaria, unspecified: Secondary | ICD-10-CM | POA: Diagnosis not present

## 2020-03-19 DIAGNOSIS — C50411 Malignant neoplasm of upper-outer quadrant of right female breast: Secondary | ICD-10-CM

## 2020-03-19 DIAGNOSIS — M8588 Other specified disorders of bone density and structure, other site: Secondary | ICD-10-CM

## 2020-03-19 DIAGNOSIS — E119 Type 2 diabetes mellitus without complications: Secondary | ICD-10-CM | POA: Insufficient documentation

## 2020-03-19 DIAGNOSIS — D72829 Elevated white blood cell count, unspecified: Secondary | ICD-10-CM | POA: Insufficient documentation

## 2020-03-19 DIAGNOSIS — Z9011 Acquired absence of right breast and nipple: Secondary | ICD-10-CM | POA: Insufficient documentation

## 2020-03-19 DIAGNOSIS — M549 Dorsalgia, unspecified: Secondary | ICD-10-CM | POA: Insufficient documentation

## 2020-03-19 DIAGNOSIS — Z170 Estrogen receptor positive status [ER+]: Secondary | ICD-10-CM | POA: Diagnosis not present

## 2020-03-19 DIAGNOSIS — R7989 Other specified abnormal findings of blood chemistry: Secondary | ICD-10-CM

## 2020-03-19 DIAGNOSIS — K219 Gastro-esophageal reflux disease without esophagitis: Secondary | ICD-10-CM | POA: Insufficient documentation

## 2020-03-19 DIAGNOSIS — E669 Obesity, unspecified: Secondary | ICD-10-CM | POA: Diagnosis not present

## 2020-03-19 LAB — CBC WITH DIFFERENTIAL/PLATELET
Abs Immature Granulocytes: 0.01 10*3/uL (ref 0.00–0.07)
Basophils Absolute: 0.1 10*3/uL (ref 0.0–0.1)
Basophils Relative: 1 %
Eosinophils Absolute: 0.1 10*3/uL (ref 0.0–0.5)
Eosinophils Relative: 2 %
HCT: 43.8 % (ref 36.0–46.0)
Hemoglobin: 15 g/dL (ref 12.0–15.0)
Immature Granulocytes: 0 %
Lymphocytes Relative: 29 %
Lymphs Abs: 1.6 10*3/uL (ref 0.7–4.0)
MCH: 30 pg (ref 26.0–34.0)
MCHC: 34.2 g/dL (ref 30.0–36.0)
MCV: 87.6 fL (ref 80.0–100.0)
Monocytes Absolute: 0.5 10*3/uL (ref 0.1–1.0)
Monocytes Relative: 9 %
Neutro Abs: 3.2 10*3/uL (ref 1.7–7.7)
Neutrophils Relative %: 59 %
Platelets: 258 10*3/uL (ref 150–400)
RBC: 5 MIL/uL (ref 3.87–5.11)
RDW: 12 % (ref 11.5–15.5)
WBC: 5.5 10*3/uL (ref 4.0–10.5)
nRBC: 0 % (ref 0.0–0.2)

## 2020-03-19 LAB — COMPREHENSIVE METABOLIC PANEL
ALT: 42 U/L (ref 0–44)
AST: 32 U/L (ref 15–41)
Albumin: 4.6 g/dL (ref 3.5–5.0)
Alkaline Phosphatase: 94 U/L (ref 38–126)
Anion gap: 10 (ref 5–15)
BUN: 12 mg/dL (ref 6–20)
CO2: 26 mmol/L (ref 22–32)
Calcium: 9.5 mg/dL (ref 8.9–10.3)
Chloride: 102 mmol/L (ref 98–111)
Creatinine, Ser: 0.64 mg/dL (ref 0.44–1.00)
GFR calc Af Amer: >60 mL/min (ref >60)
GFR calc non Af Amer: >60 mL/min (ref >60)
Glucose, Bld: 122 mg/dL — ABNORMAL HIGH (ref 70–99)
Potassium: 4.1 mmol/L (ref 3.5–5.1)
Sodium: 138 mmol/L (ref 135–145)
Total Bilirubin: 0.8 mg/dL (ref 0.3–1.2)
Total Protein: 7.5 g/dL (ref 6.5–8.1)

## 2020-03-19 NOTE — Progress Notes (Signed)
Patient here for oncology follow-up appointment, expresses no complaints or concerns at this time.    

## 2020-03-20 LAB — CANCER ANTIGEN 27.29: CA 27.29: 17.8 U/mL (ref 0.0–38.6)

## 2020-03-29 ENCOUNTER — Other Ambulatory Visit: Payer: Self-pay

## 2020-03-29 ENCOUNTER — Ambulatory Visit
Admission: RE | Admit: 2020-03-29 | Discharge: 2020-03-29 | Disposition: A | Discharge status: Home / Self Care | Payer: BLUE CROSS/BLUE SHIELD | Source: Ambulatory Visit | Attending: General Surgery | Admitting: General Surgery

## 2020-03-29 DIAGNOSIS — Z1211 Encounter for screening for malignant neoplasm of colon: Secondary | ICD-10-CM | POA: Diagnosis not present

## 2020-03-29 DIAGNOSIS — Z1231 Encounter for screening mammogram for malignant neoplasm of breast: Secondary | ICD-10-CM | POA: Diagnosis not present

## 2020-05-03 NOTE — Progress Notes (Signed)
Name: Terri Tanner   MRN: [id number]    DOB: October 11, 1960   Date:05/06/2020       Progress Note  Subjective  Chief Complaint  Annual Exam   HPI  Patient presents for annual CPE and follow up  Hives: going on since July 2020  seen by Dr. Donneta Romberg, had negative allergy testing, she weaned self off medications completely since August 2021  she is off Pepcid, Hydroxyzine and Xyzal and is doing well   Breast Cancer right side:she has mastectomy done  ( Dr. Fleet Contras ), Nov 13 th, 2020  , she did not have chemo or radiation therapy, she is seeing an integrative medicine practice called [facility] in [facility] and is taking some supplements. She had a recent visit with Dr. Mike Gip and CA 27-29 was within normal limits   Diabetes with dyslipidemia:she has changed her diet, she has been walking. She has been avoiding sugar even more since diagnosed with cancer. She denies polyphagia, polydipsia or polyuria. She has dyslipidemia she initially refused to take statin therapy but seen by an integrative medicine physician Raoul Pitch  and is now taking pravastatin low dose and tolerating it well, however last triglycerides was very high we will recheck level today, discussed prescription fish oil . She is not checking glucose at home   Vitamin D deficiency: she is taking supplementation daily   GERD: doing well on digestive enzymes that she takes for one week a month, no side effects, she states GERD under control    Diet: balanced diet, she is eats a low carb diet  Exercise:     Office Visit from 05/06/2020 in [facility]  AUDIT-C Score 0     Depression: Phq 9 is  negative Depression screen [facility] 2/9 05/06/2020 05/06/2020 11/01/2019 05/04/2019 02/09/2019  Decreased Interest 0 0 0 0 0  Down, Depressed, Hopeless 0 0 0 0 0  PHQ - 2 Score 0 0 0 0 0  Altered sleeping 0 - 0 0 0  Tired, decreased energy 0 - 0 0 0  Change in appetite 0 - 0 0 0  Feeling bad or failure about  yourself  0 - 0 0 0  Trouble concentrating 0 - 0 0 0  Moving slowly or fidgety/restless 0 - 0 0 0  Suicidal thoughts 0 - 0 0 0  PHQ-9 Score 0 - 0 0 0  Difficult doing work/chores Not difficult at all - - - -   Hypertension: BP Readings from Last 3 Encounters:  05/06/20 122/72  03/19/20 126/77  12/11/19 129/82   Obesity: Wt Readings from Last 3 Encounters:  05/06/20 162 lb (73.5 kg)  03/19/20 162 lb 0.6 oz (73.5 kg)  12/11/19 166 lb 10.7 oz (75.6 kg)   BMI Readings from Last 3 Encounters:  05/06/20 29.63 kg/m  03/19/20 29.64 kg/m  12/11/19 30.48 kg/m     Vaccines:   Shingrix: 60-64 yo and ask insurance if covered when patient above 39 yo Pneumonia: educated and discussed with patient. Flu: Offered at today's visit, educated and discussed with patient.  Hep C Screening: 11/27/2019 STD testing and prevention (HIV/chl/gon/syphilis): Not interested  Intimate partner violence: negative screen  Sexual History : she has pain during intercourse, she has not been sexually active because of it, husband is okay with that  Menstrual History/LMP/Abnormal Bleeding: discussed post-menopausal bleeding  Incontinence Symptoms: no problems   Breast cancer:  - Last Mammogram: 03/29/2020 - BRCA gene screening: N/A  Osteoporosis: Discussed high  calcium and vitamin D supplementation, weight bearing exercises  Cervical cancer screening: 05/12/2018  Skin cancer: Discussed monitoring for atypical lesions  Colorectal cancer: 03/03/2018   Lung cancer:  . Patient does not qualify.   ECG: 07/14/2018  Advanced Care Planning: A voluntary discussion about advance care planning including the explanation and discussion of advance directives.  Discussed health care proxy and Living will, and the patient was able to identify a health care proxy as husband   Patient does not have a living will at present time.   Lipids: Lab Results  Component Value Date   CHOL 247 (H) 12/22/2019   CHOL 308 (H)  05/04/2019   CHOL 172 05/12/2018   Lab Results  Component Value Date   HDL 44 (L) 12/22/2019   HDL 57 05/04/2019   HDL 49 (L) 05/12/2018   Lab Results  Component Value Date   [facility]  12/22/2019     Comment:     . LDL cholesterol not calculated. Triglyceride levels greater than 400 mg/dL invalidate calculated LDL results. . Reference range: <100 . Desirable range <100 mg/dL for primary prevention;   <70 mg/dL for patients with CHD or diabetic patients  with > or = 2 CHD risk factors. Marland Kitchen LDL-C is now calculated using the Martin-Hopkins  calculation, which is a validated novel method providing  better accuracy than the Friedewald equation in the  estimation of LDL-C.  Cresenciano Genre et al. Annamaria Helling. 3295;188(41): 2061-2068  (http://education.[facility].com/faq/FAQ164)    LDLCALC 200 (H) 05/04/2019   LDLCALC 90 05/12/2018   Lab Results  Component Value Date   TRIG 529 (H) 12/22/2019   TRIG 288 (H) 05/04/2019   TRIG 252 (H) 05/12/2018   Lab Results  Component Value Date   CHOLHDL 5.6 (H) 12/22/2019   CHOLHDL 5.4 (H) 05/04/2019   CHOLHDL 3.5 05/12/2018   No results found for: LDLDIRECT  Glucose: Glucose, Bld  Date Value Ref Range Status  03/19/2020 122 (H) 70 - 99 mg/dL Final    Comment:    Glucose reference range applies only to samples taken after fasting for at least 8 hours.  01/05/2020 119 (H) 65 - 99 mg/dL Final    Comment:    .            Fasting reference interval . For someone without known diabetes, a glucose value between 100 and 125 mg/dL is consistent with prediabetes and should be confirmed with a follow-up test. .   12/22/2019 131 (H) 65 - 99 mg/dL Final    Comment:    .            Fasting reference interval . For someone without known diabetes, a glucose value >125 mg/dL indicates that they may have diabetes and this should be confirmed with a follow-up test. .    Glucose-Capillary  Date Value Ref Range Status  05/05/2019 105 (H) 70  - 99 mg/dL Final  04/14/2018 116 (H) 70 - 99 mg/dL Final    Patient Active Problem List   Diagnosis Date Noted  . Abnormal liver function tests 12/16/2019  . Elevated liver function tests 12/09/2019  . Osteopenia of lumbar spine 08/22/2019  . Malignant neoplasm of upper-outer quadrant of right breast in female, estrogen receptor positive ([place]) 04/13/2019  . Urticaria 02/22/2019  . Long-term use of high-risk medication 05/12/2018  . Rectal polyp   . Diabetes mellitus type 2 in obese ([place]) 02/06/2018  . Dyslipidemia associated with type 2 diabetes mellitus ([place]) 02/06/2018  . GERD (  gastroesophageal reflux disease) 02/04/2018  . Vitamin D deficiency 02/04/2018  . Chronic cough 02/04/2018  . Obesity (BMI 30-39.9) 02/04/2018    Past Surgical History:  Procedure Laterality Date  . BREAST BIOPSY Right 04/07/2019  . CERVICAL POLYPECTOMY    . CESAREAN SECTION    . COLONOSCOPY WITH PROPOFOL N/A 03/03/2018   Procedure: COLONOSCOPY WITH PROPOFOL;  Surgeon: Jonathon Bellows, MD;  Location: [facility] ENDOSCOPY;  Service: Gastroenterology;  Laterality: N/A;  . MASTECTOMY Right 05/04/2020  . SENTINEL NODE BIOPSY Right 05/05/2019   Procedure: SENTINEL NODE BIOPSY;  Surgeon: Robert Bellow, MD;  Location: [facility] ORS;  Service: General;  Laterality: Right;  . SIMPLE MASTECTOMY WITH AXILLARY SENTINEL NODE BIOPSY Right 05/05/2019   Procedure: SIMPLE MASTECTOMY;  Surgeon: Robert Bellow, MD;  Location: [facility] ORS;  Service: General;  Laterality: Right;  . TRANSANAL EXCISION OF RECTAL MASS N/A 04/14/2018   Procedure: TRANSANAL EXCISION OF RECTAL POLYP;  Surgeon: Jules Husbands, MD;  Location: [facility] ORS;  Service: General;  Laterality: N/A;  . WISDOM TOOTH EXTRACTION    . WISDOM TOOTH EXTRACTION      Family History  Problem Relation Age of Onset  . Lung cancer Mother        smoked for many years  . COPD Mother   . Emphysema Mother   . Esophageal cancer Father        smoker  . Suicidality Father   .  Cancer Sister   . Stroke Brother   . Alcohol abuse Brother   . Other Brother        huge smoker  . Diabetes Sister   . Breast cancer Neg Hx     Social History   Socioeconomic History  . Marital status: Married    Spouse name: Alvester Chou  . Number of children: 1  . Years of education: Not on file  . Highest education level: High school graduate  Occupational History  . Occupation: stay at home   Tobacco Use  . Smoking status: Never Smoker  . Smokeless tobacco: Never Used  Vaping Use  . Vaping Use: Never used  Substance and Sexual Activity  . Alcohol use: Not Currently    Comment: rarely  . Drug use: Never  . Sexual activity: Not Currently    Partners: Male    Birth control/protection: None, Surgical  Other Topics Concern  . Not on file  Social History Narrative   Patient  moved here from [place], [place] 07/2017   They moved in with their only daughter to home school the grandchildren   Social Determinants of Health   Financial Resource Strain: Low Risk   . Difficulty of Paying Living Expenses: Not hard at all  Food Insecurity: No Food Insecurity  . Worried About Charity fundraiser in the Last Year: Never true  . Ran Out of Food in the Last Year: Never true  Transportation Needs: No Transportation Needs  . Lack of Transportation (Medical): No  . Lack of Transportation (Non-Medical): No  Physical Activity: Insufficiently Active  . Days of Exercise per Week: 2 days  . Minutes of Exercise per Session: 20 min  Stress: No Stress Concern Present  . Feeling of Stress : Not at all  Social Connections: Socially Integrated  . Frequency of Communication with Friends and Family: More than three times a week  . Frequency of Social Gatherings with Friends and Family: More than three times a week  . Attends Religious Services: More than 4 times per  year  . Active Member of Clubs or Organizations: Yes  . Attends Archivist Meetings: More than 4 times per year  .  Marital Status: Married  Human resources officer Violence: Not At Risk  . Fear of Current or Ex-Partner: No  . Emotionally Abused: No  . Physically Abused: No  . Sexually Abused: No     Current Outpatient Medications:  .  Acetylcysteine (NAC PO), Take 2,400 mg/day by mouth., Disp: , Rfl:  .  Ascorbic Acid (VITAMIN C) 1000 MG tablet, Take 1,000 mg by mouth daily. 4000 mg/day, Disp: , Rfl:  .  Barberry-Oreg Grape-Goldenseal (BERBERINE COMPLEX PO), Take by mouth., Disp: , Rfl:  .  Cholecalciferol (VITAMIN D3 PO), Take 7,500 Units/day by mouth., Disp: , Rfl:  .  Digestive Enzymes (BETAINE HCL PO), Take by mouth., Disp: , Rfl:  .  Digestive Enzymes (DIGESTIVE ENZYME PO), Take by mouth., Disp: , Rfl:  .  EPINEPHrine 0.3 mg/0.3 mL IJ SOAJ injection, Inject 0.3 mLs (0.3 mg total) into the muscle as needed for anaphylaxis., Disp: 2 each, Rfl: 0 .  MAGNESIUM MALATE PO, Take by mouth. nightly, Disp: , Rfl:  .  magnesium oxide (MAG-OX) 400 MG tablet, Take by mouth., Disp: , Rfl:  .  MELATONIN PO, Take 20 mg by mouth. nightly, Disp: , Rfl:  .  Menaquinone-7 (VITAMIN K2 PO), Take 150 mcg/day by mouth., Disp: , Rfl:  .  Methylsulfonylmethane (MSM PO), Take 4,000 mg/day by mouth., Disp: , Rfl:  .  Multiple Vitamin (MULTIVITAMIN) tablet, Take 1 tablet by mouth daily., Disp: , Rfl:  .  NON FORMULARY, Liposomal glutathione 282m/day, Disp: , Rfl:  .  Nutritional Supplements (CALCIUM D-GLUCARATE PO), Take 150 mg by mouth every 30 (thirty) days. (DGL), Disp: , Rfl:  .  Omega-3 Fatty Acids (SUPER OMEGA 3 EPA/DHA PO), Take by mouth., Disp: , Rfl:  .  OVER THE COUNTER MEDICATION, ESTROGEN CONTROL, Disp: , Rfl:  .  pravastatin (PRAVACHOL) 20 MG tablet, Take 1 tablet (20 mg total) by mouth at bedtime., Disp: 90 tablet, Rfl: 0  Allergies  Allergen Reactions  . Atorvastatin Hives and Swelling     ROS  Constitutional: Negative for fever or weight change.  Respiratory: Negative for cough and shortness of breath.    Cardiovascular: Negative for chest pain or palpitations.  Gastrointestinal: Negative for abdominal pain, no bowel changes.  Musculoskeletal: Negative for gait problem or joint swelling.  Skin: Negative for rash.  Neurological: Negative for dizziness or headache.  No other specific complaints in a complete review of systems (except as listed in HPI above).  Objective  Vitals:   05/06/20 0805  BP: 122/72  Pulse: 90  Resp: 17  Temp: 98.1 F (36.7 C)  TempSrc: Oral  SpO2: 100%  Weight: 162 lb (73.5 kg)  Height: _0  (1.575 m)    Body mass index is 29.63 kg/m.  Physical Exam  Constitutional: Patient appears well-developed and well-nourished. No distress.  HENT: Head: Normocephalic and atraumatic. Ears: B TMs ok, no erythema or effusion; Nose: Not done. Mouth/Throat: not done  Eyes: Conjunctivae and EOM are normal. Pupils are equal, round, and reactive to light. No scleral icterus.  Neck: Normal range of motion. Neck supple. No JVD present. No thyromegaly present.  Cardiovascular: Normal rate, regular rhythm and normal heart sounds.  No murmur heard. No BLE edema. Pulmonary/Chest: Effort normal and breath sounds normal. No respiratory distress. Abdominal: Soft. Bowel sounds are normal, no distension. There is no tenderness. no  masses Breast: left breast normal exam, mastectomy scar on right also in good aspect  FEMALE GENITALIA: not done RECTAL:not done  Musculoskeletal: Normal range of motion, no joint effusions. No gross deformities Neurological: he is alert and oriented to person, place, and time. No cranial nerve deficit. Coordination, balance, strength, speech and gait are normal.  Skin: Skin is warm and dry. No rash noted. No erythema.  Psychiatric: Patient has a normal mood and affect. behavior is normal. Judgment and thought content normal.  Recent Results (from the past 2160 hour(s))  Comprehensive metabolic panel     Status: Abnormal   Collection Time: 03/19/20   8:56 AM  Result Value Ref Range   Sodium 138 135 - 145 mmol/L   Potassium 4.1 3.5 - 5.1 mmol/L   Chloride 102 98 - 111 mmol/L   CO2 26 22 - 32 mmol/L   Glucose, Bld 122 (H) 70 - 99 mg/dL    Comment: Glucose reference range applies only to samples taken after fasting for at least 8 hours.   BUN 12 6 - 20 mg/dL   Creatinine, Ser 0.64 0.44 - 1.00 mg/dL   Calcium 9.5 8.9 - 10.3 mg/dL   Total Protein 7.5 6.5 - 8.1 g/dL   Albumin 4.6 3.5 - 5.0 g/dL   AST 32 15 - 41 U/L   ALT 42 0 - 44 U/L   Alkaline Phosphatase 94 38 - 126 U/L   Total Bilirubin 0.8 0.3 - 1.2 mg/dL   GFR calc non Af Amer >60 >60 mL/min   GFR calc Af Amer >60 >60 mL/min   Anion gap 10 5 - 15    Comment: Performed at [facility], [address], [place], [place] [address]  CBC with Differential/Platelet     Status: None   Collection Time: 03/19/20  8:56 AM  Result Value Ref Range   WBC 5.5 4.0 - 10.5 K/uL   RBC 5.00 3.87 - 5.11 MIL/uL   Hemoglobin 15.0 12.0 - 15.0 g/dL   HCT 43.8 36 - 46 %   MCV 87.6 80.0 - 100.0 fL   MCH 30.0 26.0 - 34.0 pg   MCHC 34.2 30.0 - 36.0 g/dL   RDW 12.0 11.5 - 15.5 %   Platelets 258 150 - 400 K/uL   nRBC 0.0 0.0 - 0.2 %   Neutrophils Relative % 59 %   Neutro Abs 3.2 1.7 - 7.7 K/uL   Lymphocytes Relative 29 %   Lymphs Abs 1.6 0.7 - 4.0 K/uL   Monocytes Relative 9 %   Monocytes Absolute 0.5 0.1 - 1.0 K/uL   Eosinophils Relative 2 %   Eosinophils Absolute 0.1 0.0 - 0.5 K/uL   Basophils Relative 1 %   Basophils Absolute 0.1 0.0 - 0.1 K/uL   Immature Granulocytes 0 %   Abs Immature Granulocytes 0.01 0.00 - 0.07 K/uL    Comment: Performed at [facility], [address], [place], [place] [address]  Cancer antigen 27.29     Status: None   Collection Time: 03/19/20  8:56 AM  Result Value Ref Range   CA 27.29 17.8 0.0 - 38.6 U/mL    Comment: (NOTE) Siemens Centaur Immunochemiluminometric Methodology (ICMA) Values obtained with different assay methods or kits  cannot be used interchangeably. Results cannot be interpreted as absolute evidence of the presence or absence of malignant disease. Performed At: [facility] [place], [place] [id number] Rush Farmer MD LJ:[id number]  Fall Risk: Fall Risk  05/06/2020 11/01/2019 05/04/2019 02/09/2019 11/10/2018  Falls in the past year? 0 0 0 0 0  Number falls in past yr: 0 0 0 0 0  Injury with Fall? 0 0 0 0 0  Follow up - - - - -     Functional Status Survey: Is the patient deaf or have difficulty hearing?: No Does the patient have difficulty seeing, even when wearing glasses/contacts?: No Does the patient have difficulty concentrating, remembering, or making decisions?: No Does the patient have difficulty walking or climbing stairs?: No Does the patient have difficulty dressing or bathing?: No Does the patient have difficulty doing errands alone such as visiting a doctor's office or shopping?: No   Assessment & Plan  1. Dyslipidemia associated with type 2 diabetes mellitus (HCC)  - Lipid panel - Hemoglobin A1c - Urine Microalbumin w/creat. ratio  2. Vitamin D deficiency  Taking supplementation   3. GERD without esophagitis  Under control   4. History of right breast cancer  Compliant with follow ups  5. Long-term use of high-risk medication   6. Well woman exam   -USPSTF grade A and B recommendations reviewed with patient; age-appropriate recommendations, preventive care, screening tests, etc discussed and encouraged; healthy living encouraged; see AVS for patient education given to patient -Discussed importance of 150 minutes of physical activity weekly, eat two servings of fish weekly, eat one serving of tree nuts ( cashews, pistachios, pecans, almonds.Marland Kitchen) every other day, eat 6 servings of fruit/vegetables daily and drink plenty of water and avoid sweet beverages.

## 2020-05-03 NOTE — Patient Instructions (Addendum)
Please re-consider getting Shingrix, Tdap, flu and COVID-19 vaccines  Preventive Care 36-60 Years Old, Female Preventive care refers to visits with your health care provider and lifestyle choices that can promote health and wellness. This includes:  A yearly physical exam. This may also be called an annual well check.  Regular dental visits and eye exams.  Immunizations.  Screening for certain conditions.  Healthy lifestyle choices, such as eating a healthy diet, getting regular exercise, not using drugs or products that contain nicotine and tobacco, and limiting alcohol use. What can I expect for my preventive care visit? Physical exam Your health care provider will check your:  Height and weight. This may be used to calculate body mass index (BMI), which tells if you are at a healthy weight.  Heart rate and blood pressure.  Skin for abnormal spots. Counseling Your health care provider may ask you questions about your:  Alcohol, tobacco, and drug use.  Emotional well-being.  Home and relationship well-being.  Sexual activity.  Eating habits.  Work and work Statistician.  Method of birth control.  Menstrual cycle.  Pregnancy history. What immunizations do I need?  Influenza (flu) vaccine  This is recommended every year. Tetanus, diphtheria, and pertussis (Tdap) vaccine  You may need a Td booster every 10 years. Varicella (chickenpox) vaccine  You may need this if you have not been vaccinated. Zoster (shingles) vaccine  You may need this after age 30. Measles, mumps, and rubella (MMR) vaccine  You may need at least one dose of MMR if you were born in 1957 or later. You may also need a second dose. Pneumococcal conjugate (PCV13) vaccine  You may need this if you have certain conditions and were not previously vaccinated. Pneumococcal polysaccharide (PPSV23) vaccine  You may need one or two doses if you smoke cigarettes or if you have certain  conditions. Meningococcal conjugate (MenACWY) vaccine  You may need this if you have certain conditions. Hepatitis A vaccine  You may need this if you have certain conditions or if you travel or work in places where you may be exposed to hepatitis A. Hepatitis B vaccine  You may need this if you have certain conditions or if you travel or work in places where you may be exposed to hepatitis B. Haemophilus influenzae type b (Hib) vaccine  You may need this if you have certain conditions. Human papillomavirus (HPV) vaccine  If recommended by your health care provider, you may need three doses over 6 months. You may receive vaccines as individual doses or as more than one vaccine together in one shot (combination vaccines). Talk with your health care provider about the risks and benefits of combination vaccines. What tests do I need? Blood tests  Lipid and cholesterol levels. These may be checked every 5 years, or more frequently if you are over 3 years old.  Hepatitis C test.  Hepatitis B test. Screening  Lung cancer screening. You may have this screening every year starting at age 49 if you have a 30-pack-year history of smoking and currently smoke or have quit within the past 15 years.  Colorectal cancer screening. All adults should have this screening starting at age 15 and continuing until age 21. Your health care provider may recommend screening at age 85 if you are at increased risk. You will have tests every 1-10 years, depending on your results and the type of screening test.  Diabetes screening. This is done by checking your blood sugar (glucose) after you have  not eaten for a while (fasting). You may have this done every 1-3 years.  Mammogram. This may be done every 1-2 years. Talk with your health care provider about when you should start having regular mammograms. This may depend on whether you have a family history of breast cancer.  BRCA-related cancer screening. This  may be done if you have a family history of breast, ovarian, tubal, or peritoneal cancers.  Pelvic exam and Pap test. This may be done every 3 years starting at age 8. Starting at age 71, this may be done every 5 years if you have a Pap test in combination with an HPV test. Other tests  Sexually transmitted disease (STD) testing.  Bone density scan. This is done to screen for osteoporosis. You may have this scan if you are at high risk for osteoporosis. Follow these instructions at home: Eating and drinking  Eat a diet that includes fresh fruits and vegetables, whole grains, lean protein, and low-fat dairy.  Take vitamin and mineral supplements as recommended by your health care provider.  Do not drink alcohol if: ? Your health care provider tells you not to drink. ? You are pregnant, may be pregnant, or are planning to become pregnant.  If you drink alcohol: ? Limit how much you have to 0-1 drink a day. ? Be aware of how much alcohol is in your drink. In the [place] one drink equals one 12 oz bottle of beer (355 mL), one 5 oz glass of wine (148 mL), or one 1 oz glass of hard liquor (44 mL). Lifestyle  Take daily care of your teeth and gums.  Stay active. Exercise for at least 30 minutes on 5 or more days each week.  Do not use any products that contain nicotine or tobacco, such as cigarettes, e-cigarettes, and chewing tobacco. If you need help quitting, ask your health care provider.  If you are sexually active, practice safe sex. Use a condom or other form of birth control (contraception) in order to prevent pregnancy and STIs (sexually transmitted infections).  If told by your health care provider, take low-dose aspirin daily starting at age 81. What's next?  Visit your health care provider once a year for a well check visit.  Ask your health care provider how often you should have your eyes and teeth checked.  Stay up to date on all vaccines. This information is not  intended to replace advice given to you by your health care provider. Make sure you discuss any questions you have with your health care provider. Document Revised: 02/17/2018 Document Reviewed: 02/17/2018 Elsevier Patient Education  2020 [employer].

## 2020-05-06 ENCOUNTER — Encounter: Payer: Self-pay | Admitting: Family Medicine

## 2020-05-06 ENCOUNTER — Other Ambulatory Visit: Payer: Self-pay

## 2020-05-06 ENCOUNTER — Ambulatory Visit (INDEPENDENT_AMBULATORY_CARE_PROVIDER_SITE_OTHER): Payer: BLUE CROSS/BLUE SHIELD | Admitting: Family Medicine

## 2020-05-06 VITALS — BP 122/72 | HR 90 | Temp 98.1°F | Resp 17 | Ht 62.0 in | Wt 162.0 lb

## 2020-05-06 DIAGNOSIS — Z01419 Encounter for gynecological examination (general) (routine) without abnormal findings: Secondary | ICD-10-CM | POA: Diagnosis not present

## 2020-05-06 DIAGNOSIS — E1169 Type 2 diabetes mellitus with other specified complication: Secondary | ICD-10-CM | POA: Diagnosis not present

## 2020-05-06 DIAGNOSIS — K219 Gastro-esophageal reflux disease without esophagitis: Secondary | ICD-10-CM

## 2020-05-06 DIAGNOSIS — E785 Hyperlipidemia, unspecified: Secondary | ICD-10-CM

## 2020-05-06 DIAGNOSIS — E559 Vitamin D deficiency, unspecified: Secondary | ICD-10-CM

## 2020-05-06 DIAGNOSIS — Z0000 Encounter for general adult medical examination without abnormal findings: Secondary | ICD-10-CM

## 2020-05-06 DIAGNOSIS — Z79899 Other long term (current) drug therapy: Secondary | ICD-10-CM

## 2020-05-06 DIAGNOSIS — Z853 Personal history of malignant neoplasm of breast: Secondary | ICD-10-CM

## 2020-05-07 ENCOUNTER — Other Ambulatory Visit: Payer: Self-pay | Admitting: Family Medicine

## 2020-05-07 DIAGNOSIS — E1169 Type 2 diabetes mellitus with other specified complication: Secondary | ICD-10-CM

## 2020-05-07 DIAGNOSIS — E785 Hyperlipidemia, unspecified: Secondary | ICD-10-CM

## 2020-05-07 DIAGNOSIS — E781 Pure hyperglyceridemia: Secondary | ICD-10-CM

## 2020-05-07 LAB — LIPID PANEL
Cholesterol: 264 mg/dL — ABNORMAL HIGH (ref <200)
HDL: 49 mg/dL — ABNORMAL LOW (ref >=50)
Non-HDL Cholesterol (Calc): 215 mg/dL (calc) — ABNORMAL HIGH (ref <130)
Total CHOL/HDL Ratio: 5.4 (calc) — ABNORMAL HIGH (ref <5.0)
Triglycerides: 495 mg/dL — ABNORMAL HIGH (ref <150)

## 2020-05-07 LAB — MICROALBUMIN / CREATININE URINE RATIO
Creatinine, Urine: 199 mg/dL (ref 20–275)
Microalb Creat Ratio: 13 mcg/mg creat (ref <30)
Microalb, Ur: 2.6 mg/dL

## 2020-05-07 LAB — HEMOGLOBIN A1C
Hgb A1c MFr Bld: 6.2 % of total Hgb — ABNORMAL HIGH (ref <5.7)
Mean Plasma Glucose: 131 (calc)
eAG (mmol/L): 7.3 (calc)

## 2020-05-07 MED ORDER — OMEGA-3-ACID ETHYL ESTERS 1 G PO CAPS: 2.0000 g | ORAL_CAPSULE | Freq: Two times a day (BID) | ORAL | 1 refills | Status: DC

## 2020-05-11 ENCOUNTER — Other Ambulatory Visit: Payer: Self-pay | Admitting: Family Medicine

## 2020-07-04 IMAGING — MG MM DIGITAL DIAGNOSTIC BILAT W/ TOMO W/ CAD
8 series · 8 of 24 positions shown · non-contrast
Comparison: Previous exam(s).

CLINICAL DATA: 57-year-old female for further evaluation of
possible RIGHT breast distortion and possible LEFT breast mass on
screening mammogram.

EXAM:
DIGITAL DIAGNOSTIC BILATERAL MAMMOGRAM WITH CAD AND TOMO
ULTRASOUND LEFT BREAST

[L CC synth-2D]
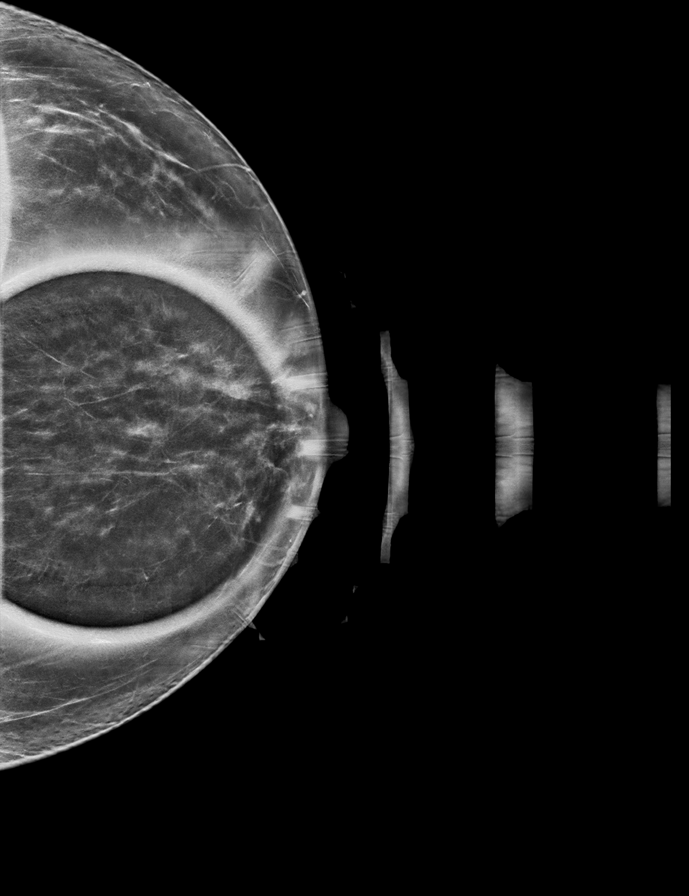

[R CC synth-2D]
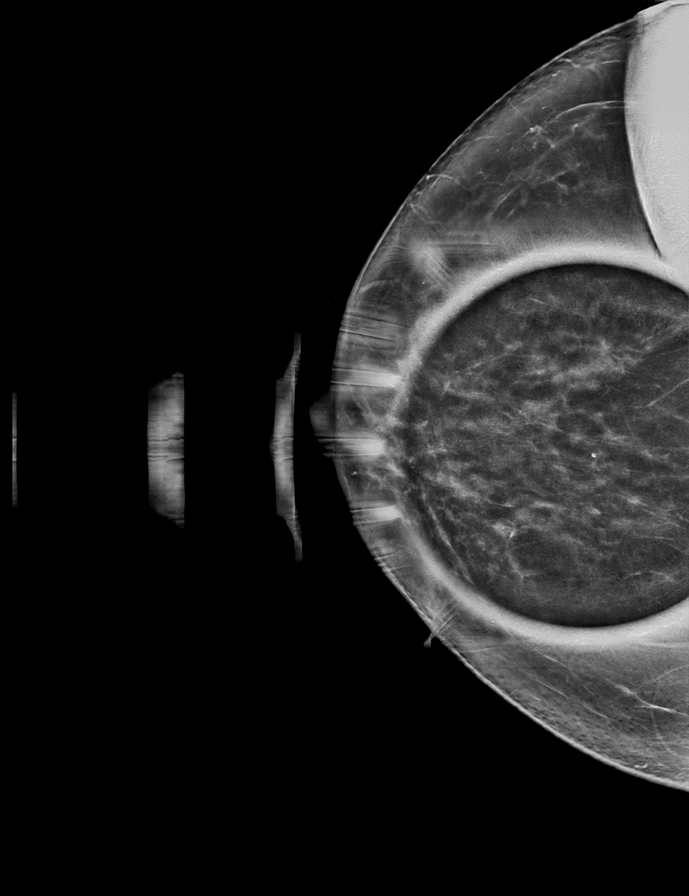

[R ML synth-2D]
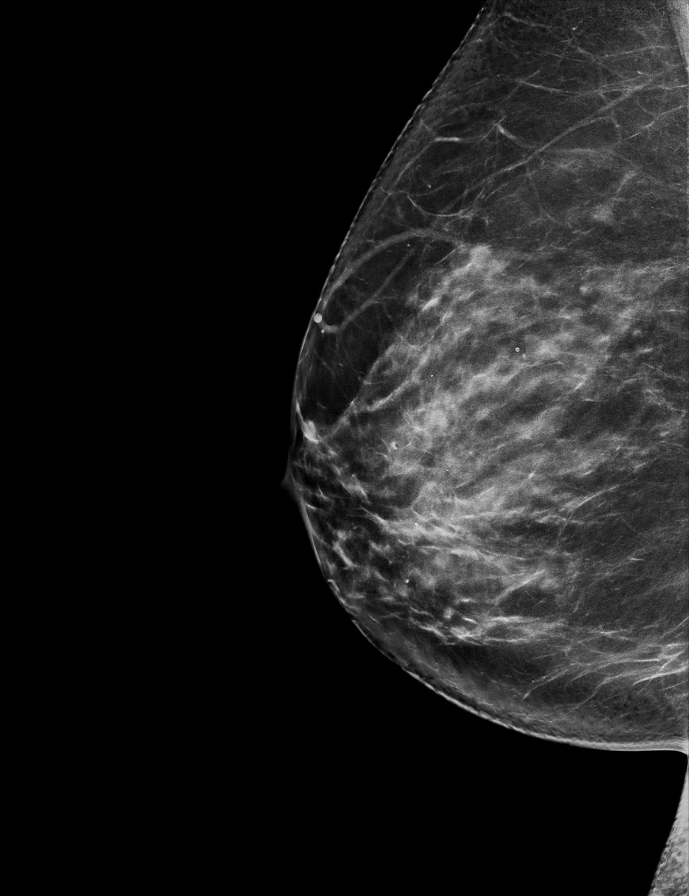

[L MLO synth-2D]
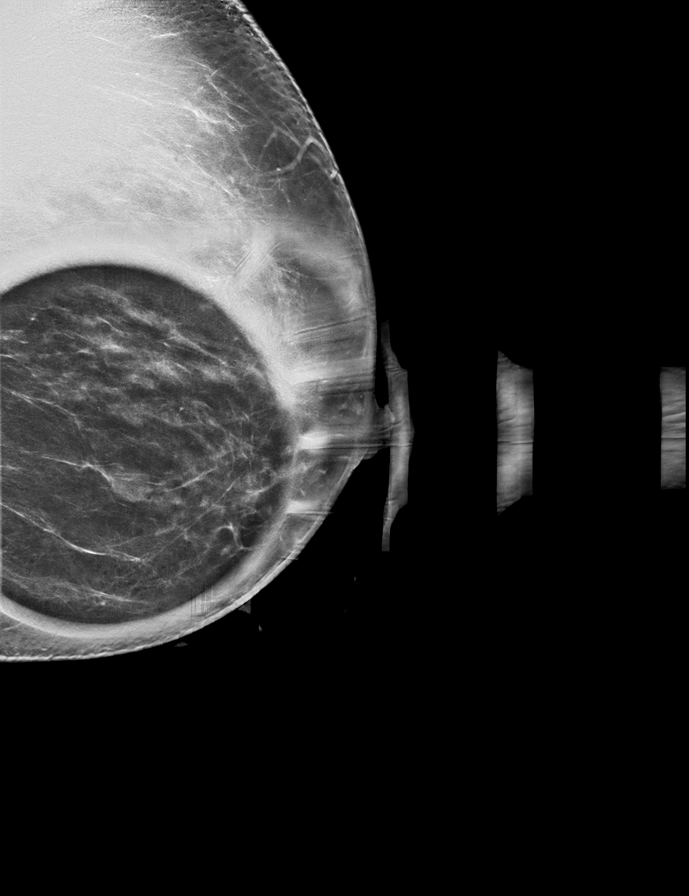

[R CC tomo · tomo slice 41/81.0]
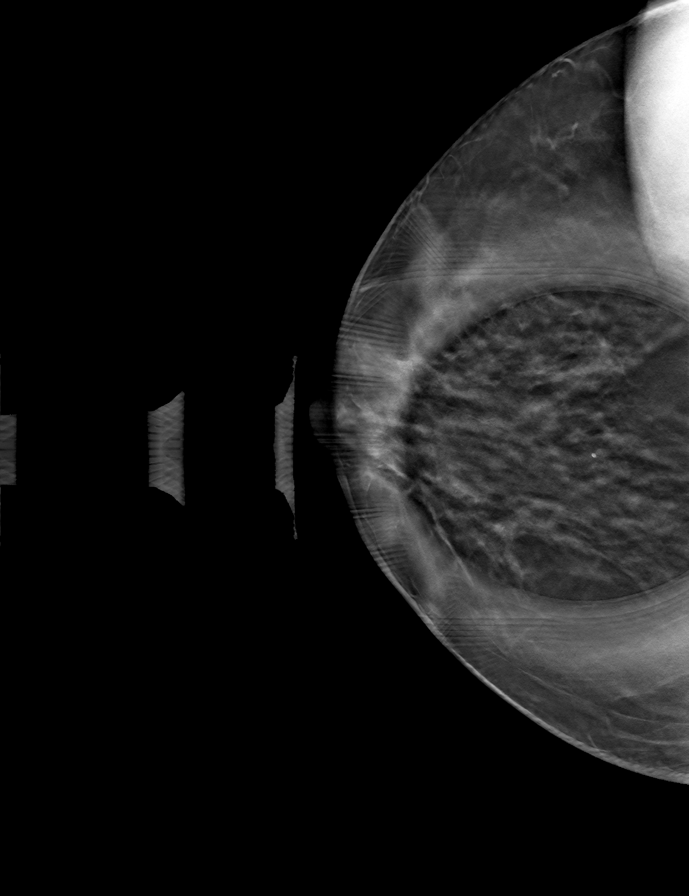

[R ML tomo · tomo slice 42/83.0]
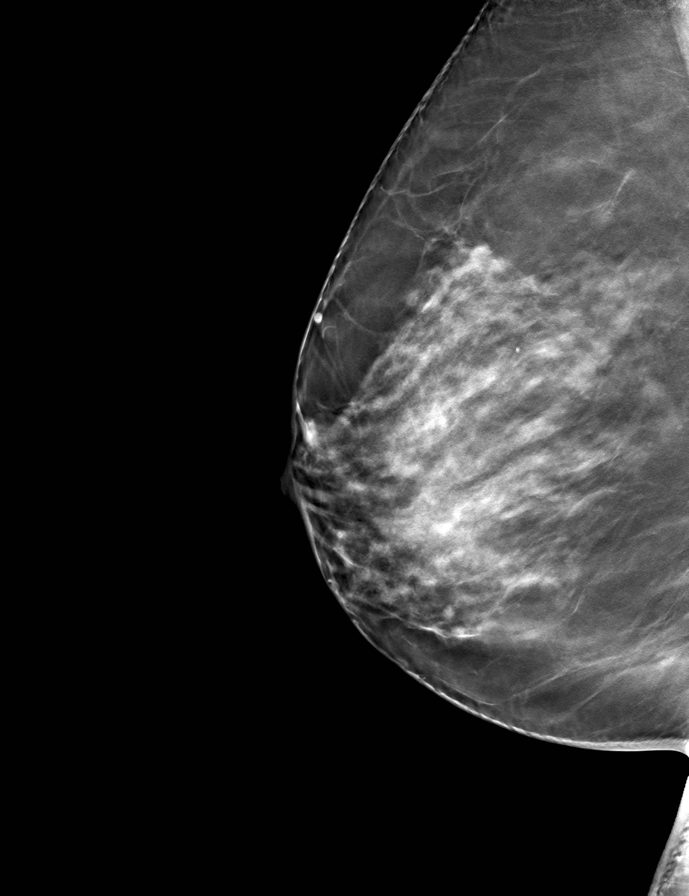

[L MLO tomo · tomo slice 37/72.0]
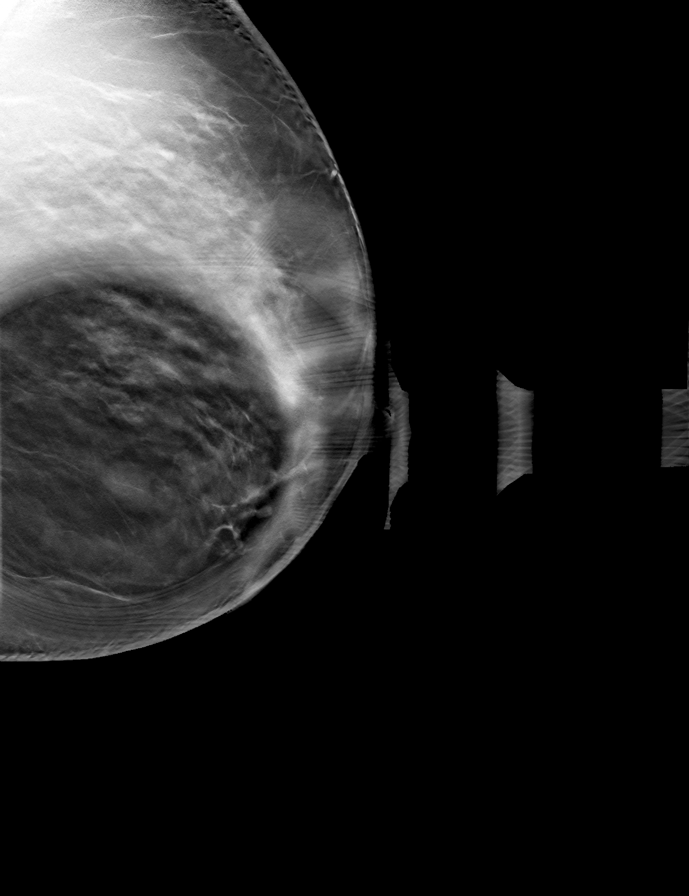

[L CC tomo · tomo slice 35/68.0]
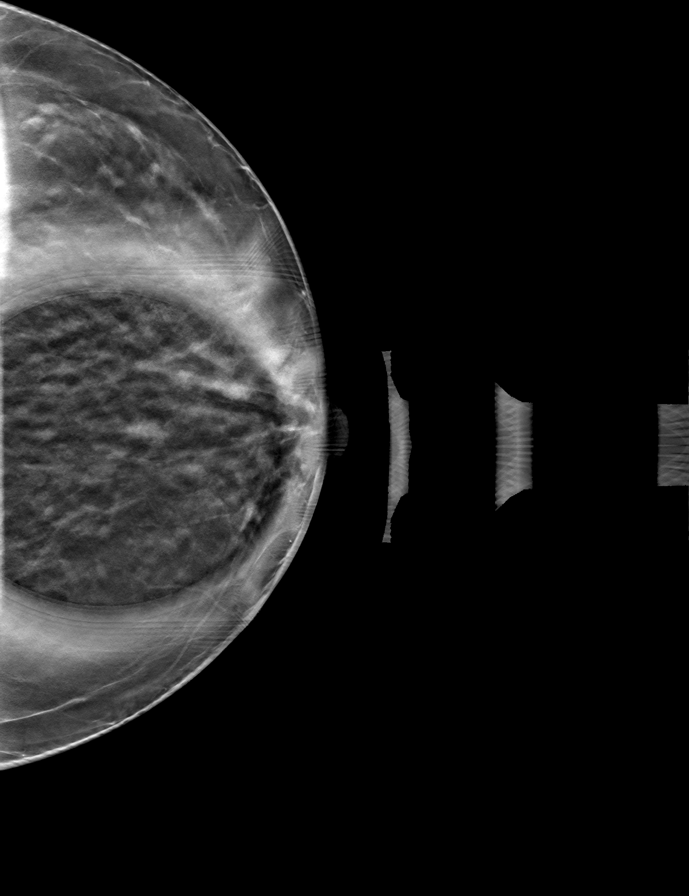

[8 of 24 positions shown; findings below may reference images not displayed]

ACR Breast Density Category b: There are scattered areas of
fibroglandular density.
FINDINGS: 2D/3D full field/spot compression views of the RIGHT breast and spot
compression views of the LEFT breast demonstrate no persistent
abnormality in the area of the RIGHT breast screening study finding.

A 1.1 cm circumscribed oval mass within the RETROAREOLAR LEFT breast
persists.

Mammographic images were processed with CAD.

Targeted ultrasound is performed, showing a 1.1 x 0.4 x 1 cm benign
cyst at the 3 o'clock position of the LEFT breast 1 cm from the
nipple, corresponding to the screening study finding.
IMPRESSION: 1. Benign cyst within the LEFT breast corresponding to the screening
study finding.
2. No persistent abnormality in the area of the RIGHT breast
screening study finding.

RECOMMENDATION:
Bilateral screening mammogram in 1 year

I have discussed the findings and recommendations with the patient.
Results were also provided in writing at the conclusion of the
visit. If applicable, a reminder letter will be sent to the patient
regarding the next appointment.

BI-RADS CATEGORY  2: Benign.

## 2020-07-15 NOTE — Progress Notes (Incomplete)
[facility]  [address] [place], [place] [address] Phone: [phone]  Fax: [phone]   Clinic Day:  07/15/2020  Referring physician: Alba Cory, MD  Chief Complaint: Terri Tanner is a 60 y.o. female with stage IA right breast cancer s/p mastectomy who is seen for 4 month assessment.  HPI: The patient was last seen in the medical oncology clinic on 03/19/2020. At that time, she felt "great".  LFTs had resolved off of [place]uch[place].  Exam was stable. Hematocrit was 43.8, hemoglobin 15.0, platelets 258,000, WBC 5,500. CA27.29 was 17.8. She continued calcium and vitamin D.  Left screening mammogram on 03/29/2020 revealed no evidence of malignancy.  The patient saw Dr. Lemar Livings on 04/09/2020. She reported left lateral chest wall tenderness which was felt to be musculoskeletal. Follow-up was planned for 1 year.   During the interim, ***   Past Medical History:  Diagnosis Date  . Allergy   . Diabetes mellitus type 2 in obese (HCC) 02/06/2018  . Environmental and seasonal allergies   . GERD (gastroesophageal reflux disease)     Past Surgical History:  Procedure Laterality Date  . BREAST BIOPSY Right 04/07/2019  . CERVICAL POLYPECTOMY    . CESAREAN SECTION    . COLONOSCOPY WITH PROPOFOL N/A 03/03/2018   Procedure: COLONOSCOPY WITH PROPOFOL;  Surgeon: Wyline Mood, MD;  Location: [facility] ENDOSCOPY;  Service: Gastroenterology;  Laterality: N/A;  . MASTECTOMY Right 05/04/2020  . SENTINEL NODE BIOPSY Right 05/05/2019   Procedure: SENTINEL NODE BIOPSY;  Surgeon: Earline Mayotte, MD;  Location: [facility] ORS;  Service: General;  Laterality: Right;  . SIMPLE MASTECTOMY WITH AXILLARY SENTINEL NODE BIOPSY Right 05/05/2019   Procedure: SIMPLE MASTECTOMY;  Surgeon: Earline Mayotte, MD;  Location: [facility] ORS;  Service: General;  Laterality: Right;  . TRANSANAL EXCISION OF RECTAL MASS N/A 04/14/2018   Procedure: TRANSANAL EXCISION OF RECTAL POLYP;  Surgeon:  Leafy Ro, MD;  Location: [facility] ORS;  Service: General;  Laterality: N/A;  . WISDOM TOOTH EXTRACTION    . WISDOM TOOTH EXTRACTION      Family History  Problem Relation Age of Onset  . Lung cancer Mother        smoked for many years  . COPD Mother   . Emphysema Mother   . Esophageal cancer Father        smoker  . Suicidality Father   . Cancer Sister   . Stroke Brother   . Alcohol abuse Brother   . Other Brother        huge smoker  . Diabetes Sister   . Breast cancer Neg Hx     Social History:  reports that she has never smoked. She has never used smokeless tobacco. She reports previous alcohol use. She reports that she does not use drugs. She denies any exposure to radiation or toxins. She lives with her daughter and grandchildren in [place]. She moved in July. 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 The patient is alone*** today.   Allergies:  Allergies  Allergen Reactions  . Atorvastatin Hives and Swelling    Current Medications: Current Outpatient Medications  Medication Sig Dispense Refill  . Acetylcysteine (NAC PO) Take 2,400 mg/day by mouth.    . Ascorbic Acid (VITAMIN C) 1000 MG tablet Take 1,000 mg by mouth daily. 4000 mg/day    . Barberry-Oreg Grape-Goldenseal (BERBERINE COMPLEX PO) Take by mouth.    . Cholecalciferol (VITAMIN D3 PO) Take 7,500 Units/day by mouth.    . Digestive  Enzymes (BETAINE HCL PO) Take by mouth.    . Digestive Enzymes (DIGESTIVE ENZYME PO) Take by mouth.    . EPINEPHrine 0.3 mg/0.3 mL IJ SOAJ injection Inject 0.3 mLs (0.3 mg total) into the muscle as needed for anaphylaxis. 2 each 0  . MAGNESIUM MALATE PO Take by mouth. nightly    . magnesium oxide (MAG-OX) 400 MG tablet Take by mouth.    . MELATONIN PO Take 20 mg by mouth. nightly    . Menaquinone-7 (VITAMIN K2 PO) Take 150 mcg/day by mouth.    . Methylsulfonylmethane (MSM PO) Take 4,000 mg/day by mouth.    . Multiple Vitamin (MULTIVITAMIN) tablet Take 1 tablet by mouth daily.    .  NON FORMULARY Liposomal glutathione 250mg /day    . Nutritional Supplements (CALCIUM D-GLUCARATE PO) Take 150 mg by mouth every 30 (thirty) days. (DGL)    . omega-3 acid ethyl esters (LOVAZA) 1 g capsule Take 2 capsules (2 g total) by mouth 2 (two) times daily. 360 capsule 1  . OVER THE COUNTER MEDICATION ESTROGEN CONTROL    . pravastatin (PRAVACHOL) 20 MG tablet TAKE ONE TABLET BY MOUTH AT BEDTIME 90 tablet 1   No current facility-administered medications for this visit.    Review of Systems  Constitutional: Positive for weight loss (4 lbs). Negative for chills, diaphoresis, fever and malaise/fatigue.       Feels "great."  HENT: Negative.  Negative for congestion, ear discharge, ear pain, hearing loss, nosebleeds, sinus pain, sore throat and tinnitus.   Eyes: Negative.  Negative for blurred vision, double vision and photophobia.  Respiratory: Negative.  Negative for cough, hemoptysis, sputum production, shortness of breath and wheezing.   Cardiovascular: Negative.  Negative for chest pain, palpitations, orthopnea, leg swelling and PND.  Gastrointestinal: Negative.  Negative for abdominal pain, blood in stool, constipation, diarrhea, heartburn, melena, nausea and vomiting.  Genitourinary: Negative.  Negative for dysuria, frequency, hematuria and urgency.  Musculoskeletal: Positive for back pain (comes and goes, side of mastectomy). Negative for joint pain, myalgias and neck pain.       Nodules in left axilla, come and go.  Skin: Negative.  Negative for itching and rash.  Neurological: Negative for dizziness, tingling, tremors, sensory change, speech change, focal weakness, loss of consciousness, weakness and headaches.  Endo/Heme/Allergies: Negative.  Does not bruise/bleed easily.  Psychiatric/Behavioral: Negative.  Negative for depression and memory loss. The patient is not nervous/anxious and does not have insomnia.   All other systems reviewed and are negative.  Performance status (ECOG):  0***  Vitals There were no vitals taken for this visit.   Physical Exam Vitals and nursing note reviewed.  Constitutional:      General: She is not in acute distress.    Appearance: She is well-developed. She is not diaphoretic.  HENT:     Head: Normocephalic and atraumatic.     Comments: Long brown hair.    Mouth/Throat:     Pharynx: No oropharyngeal exudate.  Eyes:     General: No scleral icterus.    Conjunctiva/sclera: Conjunctivae normal.     Pupils: Pupils are equal, round, and reactive to light.     Comments: Glasses.  Brown eyes.  Neck:     Vascular: No JVD.  Cardiovascular:     Rate and Rhythm: Normal rate and regular rhythm.     Heart sounds: Normal heart sounds. No murmur heard.   Pulmonary:     Effort: Pulmonary effort is normal. No respiratory distress.  Breath sounds: Normal breath sounds. No wheezing or rales.  Chest:     Chest wall: No tenderness.  Breasts:     Right: Absent. No axillary adenopathy or supraclavicular adenopathy.     Left: Skin change (fibrocystic changes at 3 o'clock, 5 cm from nipple) and tenderness (3 o'clock) present. No axillary adenopathy or supraclavicular adenopathy.    Abdominal:     General: Bowel sounds are normal. There is no distension.     Palpations: Abdomen is soft. There is no hepatomegaly, splenomegaly or mass.     Tenderness: There is no abdominal tenderness. There is no guarding or rebound.  Musculoskeletal:        General: Tenderness (right upper back) present. Normal range of motion.     Cervical back: Normal range of motion and neck supple.  Lymphadenopathy:     Head:     Right side of head: No preauricular, posterior auricular or occipital adenopathy.     Left side of head: No preauricular, posterior auricular or occipital adenopathy.     Cervical: No cervical adenopathy.     Upper Body:     Right upper body: No supraclavicular or axillary adenopathy.     Left upper body: No supraclavicular or axillary  adenopathy.     Lower Body: No right inguinal adenopathy.  Skin:    General: Skin is warm and dry.     Coloration: Skin is not pale.  Neurological:     Mental Status: She is alert and oriented to person, place, and time.  Psychiatric:        Behavior: Behavior normal.        Thought Content: Thought content normal.        Judgment: Judgment normal.    No visits with results within 3 Day(s) from this visit.  Latest known visit with results is:  Office Visit on 05/06/2020  Component Date Value Ref Range Status  . Cholesterol 05/06/2020 264* <200 mg/dL Final  . HDL 05/06/2020 49* > OR = 50 mg/dL Final  . Triglycerides 05/06/2020 495* <150 mg/dL Final   Comment: . If a non-fasting specimen was collected, consider repeat triglyceride testing on a fasting specimen if clinically indicated.  Yates Decamp et al. J. of Clin. Lipidol. 0102;7:253-664. .   . LDL Cholesterol (Calc) 05/06/2020   mg/dL (calc) Final   Comment: . LDL cholesterol not calculated. Triglyceride levels greater than 400 mg/dL invalidate calculated LDL results. . Reference range: <100 . Desirable range <100 mg/dL for primary prevention;   <70 mg/dL for patients with CHD or diabetic patients  with > or = 2 CHD risk factors. Marland Kitchen LDL-C is now calculated using the Martin-Hopkins  calculation, which is a validated novel method providing  better accuracy than the Friedewald equation in the  estimation of LDL-C.  Cresenciano Genre et al. Annamaria Helling. 4034;742(59): 2061-2068  (http://education.[facility].com/faq/FAQ164)   . Total CHOL/HDL Ratio 05/06/2020 5.4* <5.0 (calc) Final  . Non-HDL Cholesterol (Calc) 05/06/2020 215* <130 mg/dL (calc) Final   Comment: For patients with diabetes plus 1 major ASCVD risk  factor, treating to a non-HDL-C goal of <100 mg/dL  (LDL-C of <70 mg/dL) is considered a therapeutic  option.   . Hgb A1c MFr Bld 05/06/2020 6.2* <5.7 % of total Hgb Final   Comment: For someone without known diabetes, a  hemoglobin  A1c value between 5.7% and 6.4% is consistent with prediabetes and should be confirmed with a  follow-up test. . For someone with known diabetes, a value <7%  indicates that their diabetes is well controlled. A1c targets should be individualized based on duration of diabetes, age, comorbid conditions, and other considerations. . This assay result is consistent with an increased risk of diabetes. . Currently, no consensus exists regarding use of hemoglobin A1c for diagnosis of diabetes for children. .   . Mean Plasma Glucose 05/06/2020 131  (calc) Final  . eAG (mmol/L) 05/06/2020 7.3  (calc) Final  . Creatinine, Urine 05/06/2020 199  20 - 275 mg/dL Final  . Microalb, Ur 05/06/2020 2.6  mg/dL Final   Comment: Reference Range Not established   . Microalb Creat Ratio 05/06/2020 13  <30 mcg/mg creat Final   Comment: . The ADA defines abnormalities in albumin excretion as follows: Marland Kitchen Albuminuria Category        Result (mcg/mg creatinine) . Normal to Mildly increased   <30 Moderately increased         30-299  Severely increased           > OR = 300 . The ADA recommends that at least two of three specimens collected within a 3-6 month period be abnormal before considering a patient to be within a diagnostic category.     Assessment:  Terri Tanner is a 60 y.o. female withstage IA right breast cancers/p right simple mastectomy and sentinel node biopsy on 05/05/2019.  Pathology revealed a 1.3 x 1.1 x 0.8 cm grade I invasive mammary carcinoma, no special type. Three sentinel lymph nodes were negative.  Tumor was ER positive (>90%). PR positive (40%) and HER2/neu negative.  Pathologic stage was pT1c pN0.  Oncotype DX revealed < 1% benefit of chemotherapy.  She declined endocrine therapy.  She takes many supplements.  CA27.29 was 13.9 on 04/13/2019, 21.9 on 08/22/2019, 26 on 11/27/2019, 23 on 01/05/2020, and 17.8 on 03/19/2020.  Bilateraldiagnosticmammogramon  03/24/2019 revealed a suspicious 1.1 cmmass in the right breastat the 11 o'clock position and a 0.5 cm oval circumscribed mass in the upper outer left breast.Targeted ultrasoundof the right breast revealed an irregular 1.3 x 0.8 x 1.1 cm hypoechoic mass at 11 o'clock 4 cm from the nipple. Lymph nodes were unremarkable. Targeted ultrasound of the left breast revealed a cluster of microcysts at 2 o'clock 6 cm from nipple measuring 0.7 x 0.2 x 0.7 cm. Left screening mammogram on 03/29/2020 revealed no evidence of malignancy.  Bone density on 06/08/2019 revealed osteopenia with a T-score of -1.7 in the AP spine L1-L4.   She has a family historyof breast cancer, ovarian cancer, endometrial cancer, brain tumors, and smoking related cancers (lung and head and neck).  [facility] genetic testing on 04/13/2019 was negative.  She has a history ofchronic urticariasince 12/2018 and mild leukocytosissince 02/09/2019. WBChas ranged between 11,100 and 13,600. Differential has been predominantly neutrophils. Etiology is felt reactive. CRP has been elevated.  Work-up on 09/02/2020revealed a hematocrit of 40.8, hemoglobin 13.9, MCV 87.4, platelets 296,000, white count 12,700 with an [place] of10,300. Differentialincluded 80% segs, 13% lymphs, 5% monocytes, and1% eosinophils.ANA was negative.Tryptasewas 8.5 (2.2-13.2). SPEP revealed no monoclonal protein. IgG was 621, IgA 141,IgM 115, andIgE4 (low).  Peripheralsmearrevealed mild leukocytosis with neutrophilia and minimal left shifted maturation. There is no significant morphologic abnormalities of leukocytes. There was unremarkable RBC morphology and indices. There was normal platelet count and morphology.   She had elevated liver function tests. AST was 58 and ALT 98 on 11/27/2019.  Hepatitis A, B, and C serologies were negative on 11/27/2019  She has chronic urticariaand intermittent angioedema of her  lips. Etiology does not appear  related to any medications, herbal products, foods, inset bites, infections, or NSAIDs. She has no apparent autoimmune disease.  She has not received the COVID-19 vaccine.  Symptomatically, ***   Plan: 1.    Labs today: CBC with diff, CMP, CA 27.29.   2.    Clinical stage IA right breast cancer Patient is s/p right mastectomy and SLN on 05/05/2019. Oncotype DX testing- < 1% benefit of chemotherapy.             She declined endocrine therapy.  Patient has stopped her supplements secondary to elevated LFTs.   LFTs resolved off of [place].  Continue to monitor. 3.Elevated LFTs  AST 58 and ALT 98 on 11/27/2019.  AST 22 and ALT 26 on 12/11/2019.  AST 32 and ALT 42 on 03/19/2020.  Patient relates elevated LFTs to her Kombucha tea.  Continue to monitor. 4.   Chronic urticaria Patient notes hives went away around 02/09/2020.  Etiology may have been due to supplements. 5.Osteopenia Bone density on 06/08/2019 revealed osteopenia with a T-score of -1.7 in the AP spine L1-L4.   Continue calcium and vitamin D.  Discuss consideration of a bisphosphonate or a RANK ligand if bone density declines. 6.   RTC in 4 months for MD assessment and labs (CBC with diff, CMP, CA 27.29).  I discussed the assessment and treatment plan with the patient.  The patient was provided an opportunity to ask questions and all were answered.  The patient agreed with the plan and demonstrated an understanding of the instructions.  The patient was advised to call back if the symptoms worsen or if the condition fails to improve as anticipated.  I provided *** minutes of face-to-face time during this this encounter and > 50% was spent counseling as documented under my assessment and plan.  Lequita Asal, MD, PhD    07/15/2020, 10:00 AM  I, Mirian Mo Tufford, am acting as a Education administrator for [employer]. Mike Gip, MD.   I, Melissa C. Mike Gip, MD, have reviewed the  above documentation for accuracy and completeness, and I agree with the above.

## 2020-07-16 ENCOUNTER — Inpatient Hospital Stay: Payer: BLUE CROSS/BLUE SHIELD | Attending: Hematology and Oncology | Admitting: Hematology and Oncology

## 2020-07-16 ENCOUNTER — Other Ambulatory Visit: Payer: BLUE CROSS/BLUE SHIELD

## 2020-07-22 ENCOUNTER — Inpatient Hospital Stay: Payer: BLUE CROSS/BLUE SHIELD

## 2020-07-22 ENCOUNTER — Inpatient Hospital Stay: Payer: BLUE CROSS/BLUE SHIELD | Admitting: Hematology and Oncology

## 2020-07-22 DIAGNOSIS — M8588 Other specified disorders of bone density and structure, other site: Secondary | ICD-10-CM

## 2020-07-22 DIAGNOSIS — C50411 Malignant neoplasm of upper-outer quadrant of right female breast: Secondary | ICD-10-CM

## 2020-08-01 NOTE — Progress Notes (Signed)
[facility]  [address] [place], [place] [address] Phone: [phone]  Fax: [phone]   Clinic Day:  08/05/2020  Referring physician: Steele Sizer, MD  Chief Complaint: Terri Tanner is a 60 y.o. female with stage IA right breast cancer s/p mastectomy who is seen for 4 month assessment.  HPI: The patient was last seen in the medical oncology clinic on 03/19/2020. At that time, she felt "great". LFT abnormalities had resolved off of [place].  Exam was stable. Hematocrit was 43.8, hemoglobin 15.0, platelets 258,000, WBC 5,500 with an ANC of 3200.  CA27.29 was 17.8. She continued calcium and vitamin D.  Left screening mammogram on 03/29/2020 revealed no evidence of malignancy.  The patient saw Dr. Bary Castilla on 04/09/2020. She reported left lateral chest wall tenderness which was felt to be musculoskeletal. Follow-up was planned for 1 year.   During the interim, she has been "great." She denies any chest wall tenderness. Her back pain has improved. Her rash has completely resolved.  The patient has not been drinking Kombucha. She takes calcium and vitamin D.  The patient performs monthly breast self exams.   Past Medical History:  Diagnosis Date  . Allergy   . Diabetes mellitus type 2 in obese ([place]) 02/06/2018  . Environmental and seasonal allergies   . GERD (gastroesophageal reflux disease)     Past Surgical History:  Procedure Laterality Date  . BREAST BIOPSY Right 04/07/2019  . CERVICAL POLYPECTOMY    . CESAREAN SECTION    . COLONOSCOPY WITH PROPOFOL N/A 03/03/2018   Procedure: COLONOSCOPY WITH PROPOFOL;  Surgeon: Jonathon Bellows, MD;  Location: [facility] ENDOSCOPY;  Service: Gastroenterology;  Laterality: N/A;  . MASTECTOMY Right 05/04/2020  . SENTINEL NODE BIOPSY Right 05/05/2019   Procedure: SENTINEL NODE BIOPSY;  Surgeon: Robert Bellow, MD;  Location: [facility] ORS;  Service: General;  Laterality: Right;  . SIMPLE MASTECTOMY WITH  AXILLARY SENTINEL NODE BIOPSY Right 05/05/2019   Procedure: SIMPLE MASTECTOMY;  Surgeon: Robert Bellow, MD;  Location: [facility] ORS;  Service: General;  Laterality: Right;  . TRANSANAL EXCISION OF RECTAL MASS N/A 04/14/2018   Procedure: TRANSANAL EXCISION OF RECTAL POLYP;  Surgeon: Jules Husbands, MD;  Location: [facility] ORS;  Service: General;  Laterality: N/A;  . WISDOM TOOTH EXTRACTION    . WISDOM TOOTH EXTRACTION      Family History  Problem Relation Age of Onset  . Lung cancer Mother        smoked for many years  . COPD Mother   . Emphysema Mother   . Esophageal cancer Father        smoker  . Suicidality Father   . Cancer Sister   . Stroke Brother   . Alcohol abuse Brother   . Other Brother        huge smoker  . Diabetes Sister   . Breast cancer Neg Hx     Social History:  reports that she has never smoked. She has never used smokeless tobacco. She reports previous alcohol use. She reports that she does not use drugs. She denies any exposure to radiation or toxins. She lives with her daughter and grandchildren in [place]. She moved in July. 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 The patient is alone today.   Allergies:  Allergies  Allergen Reactions  . Atorvastatin Hives and Swelling    Current Medications: Current Outpatient Medications  Medication Sig Dispense Refill  . Acetylcysteine (NAC PO) Take 2,400 mg/day by mouth.    Marland Kitchen  Ascorbic Acid (VITAMIN C) 1000 MG tablet Take 1,000 mg by mouth daily. 4000 mg/day    . Barberry-Oreg Grape-Goldenseal (BERBERINE COMPLEX PO) Take by mouth.    . Cholecalciferol (VITAMIN D3 PO) Take 7,500 Units/day by mouth.    . Digestive Enzymes (BETAINE HCL PO) Take by mouth.    . Digestive Enzymes (DIGESTIVE ENZYME PO) Take by mouth.    Marland Kitchen MAGNESIUM MALATE PO Take by mouth. nightly    . magnesium oxide (MAG-OX) 400 MG tablet Take by mouth.    . MELATONIN PO Take 20 mg by mouth. nightly    . Menaquinone-7 (VITAMIN K2 PO) Take 150 mcg/day by  mouth.    . Methylsulfonylmethane (MSM PO) Take 4,000 mg/day by mouth.    . Multiple Vitamin (MULTIVITAMIN) tablet Take 1 tablet by mouth daily.    . NON FORMULARY Liposomal glutathione 214m/day    . Nutritional Supplements (CALCIUM D-GLUCARATE PO) Take 150 mg by mouth every 30 (thirty) days. (DGL)    . omega-3 acid ethyl esters (LOVAZA) 1 g capsule Take 2 capsules (2 g total) by mouth 2 (two) times daily. 360 capsule 1  . OVER THE COUNTER MEDICATION ESTROGEN CONTROL    . pravastatin (PRAVACHOL) 20 MG tablet TAKE ONE TABLET BY MOUTH AT BEDTIME (Patient taking differently: 40 mg.) 90 tablet 1  . EPINEPHrine 0.3 mg/0.3 mL IJ SOAJ injection Inject 0.3 mLs (0.3 mg total) into the muscle as needed for anaphylaxis. (Patient not taking: Reported on 08/02/2020) 2 each 0   No current facility-administered medications for this visit.    Review of Systems  Constitutional: Positive for weight loss (1 lb). Negative for chills, diaphoresis, fever and malaise/fatigue.       Feels "great."  HENT: Negative.  Negative for congestion, ear discharge, ear pain, hearing loss, nosebleeds, sinus pain, sore throat and tinnitus.   Eyes: Negative.  Negative for blurred vision, double vision and photophobia.  Respiratory: Negative.  Negative for cough, hemoptysis, sputum production, shortness of breath and wheezing.   Cardiovascular: Negative.  Negative for chest pain, palpitations, orthopnea, leg swelling and PND.  Gastrointestinal: Negative.  Negative for abdominal pain, blood in stool, constipation, diarrhea, heartburn, melena, nausea and vomiting.  Genitourinary: Negative.  Negative for dysuria, frequency, hematuria and urgency.  Musculoskeletal: Positive for back pain (improved). Negative for joint pain, myalgias and neck pain.  Skin: Negative.  Negative for itching and rash.  Neurological: Negative.  Negative for dizziness, tingling, tremors, sensory change, speech change, focal weakness, loss of consciousness,  weakness and headaches.  Endo/Heme/Allergies: Negative.  Does not bruise/bleed easily.  Psychiatric/Behavioral: Negative.  Negative for depression and memory loss. The patient is not nervous/anxious and does not have insomnia.   All other systems reviewed and are negative.  Performance status (ECOG): 0  Vitals Blood pressure 125/73, pulse 81, temperature 97.8 F (36.6 C), temperature source Tympanic, resp. rate 18, weight 161 lb 11.3 oz (73.4 kg), SpO2 100 %.   Physical Exam Vitals and nursing note reviewed.  Constitutional:      General: She is not in acute distress.    Appearance: She is well-developed. She is not diaphoretic.  HENT:     Head: Normocephalic and atraumatic.     Comments: Long brown hair.    Mouth/Throat:     Mouth: Mucous membranes are moist.     Pharynx: Oropharynx is clear. No oropharyngeal exudate.  Eyes:     General: No scleral icterus.    Conjunctiva/sclera: Conjunctivae normal.  Pupils: Pupils are equal, round, and reactive to light.     Comments: Glasses.  Brown eyes.  Neck:     Vascular: No JVD.  Cardiovascular:     Rate and Rhythm: Normal rate and regular rhythm.     Heart sounds: Normal heart sounds. No murmur heard.   Pulmonary:     Effort: Pulmonary effort is normal. No respiratory distress.     Breath sounds: Normal breath sounds. No wheezing or rales.  Chest:     Chest wall: No tenderness.  Breasts:     Right: Absent. No swelling, tenderness, axillary adenopathy or supraclavicular adenopathy.     Left: Skin change (fibrocystic changes LLQ and LUQ) present. No swelling, bleeding, mass, tenderness, axillary adenopathy or supraclavicular adenopathy.    Abdominal:     General: Bowel sounds are normal. There is no distension.     Palpations: Abdomen is soft. There is no hepatomegaly, splenomegaly or mass.     Tenderness: There is no abdominal tenderness. There is no guarding or rebound.  Musculoskeletal:        General: Normal range of  motion.     Cervical back: Normal range of motion and neck supple.  Lymphadenopathy:     Head:     Right side of head: No preauricular, posterior auricular or occipital adenopathy.     Left side of head: No preauricular, posterior auricular or occipital adenopathy.     Cervical: No cervical adenopathy.     Upper Body:     Right upper body: No supraclavicular or axillary adenopathy.     Left upper body: No supraclavicular or axillary adenopathy.     Lower Body: No right inguinal adenopathy.  Skin:    General: Skin is warm and dry.     Coloration: Skin is not pale.  Neurological:     Mental Status: She is alert and oriented to person, place, and time.  Psychiatric:        Behavior: Behavior normal.        Thought Content: Thought content normal.        Judgment: Judgment normal.    Appointment on 08/05/2020  Component Date Value Ref Range Status  . Sodium 08/05/2020 138  135 - 145 mmol/L Final  . Potassium 08/05/2020 4.2  3.5 - 5.1 mmol/L Final  . Chloride 08/05/2020 101  98 - 111 mmol/L Final  . CO2 08/05/2020 25  22 - 32 mmol/L Final  . Glucose, Bld 08/05/2020 118* 70 - 99 mg/dL Final   Glucose reference range applies only to samples taken after fasting for at least 8 hours.  . BUN 08/05/2020 12  6 - 20 mg/dL Final  . Creatinine, Ser 08/05/2020 0.63  0.44 - 1.00 mg/dL Final  . Calcium 08/05/2020 9.8  8.9 - 10.3 mg/dL Final  . Total Protein 08/05/2020 7.8  6.5 - 8.1 g/dL Final  . Albumin 08/05/2020 4.4  3.5 - 5.0 g/dL Final  . AST 08/05/2020 26  15 - 41 U/L Final  . ALT 08/05/2020 36  0 - 44 U/L Final  . Alkaline Phosphatase 08/05/2020 85  38 - 126 U/L Final  . Total Bilirubin 08/05/2020 0.5  0.3 - 1.2 mg/dL Final  . GFR, Estimated 08/05/2020 >60  >60 mL/min Final   Comment: (NOTE) Calculated using the CKD-EPI Creatinine Equation (2021)   . Anion gap 08/05/2020 12  5 - 15 Final   Performed at [facility], [address], [place], [place] [address]  .  WBC  08/05/2020 6.4  4.0 - 10.5 K/uL Final  . RBC 08/05/2020 4.89  3.87 - 5.11 MIL/uL Final  . Hemoglobin 08/05/2020 14.3  12.0 - 15.0 g/dL Final  . HCT 08/05/2020 42.5  36.0 - 46.0 % Final  . MCV 08/05/2020 86.9  80.0 - 100.0 fL Final  . MCH 08/05/2020 29.2  26.0 - 34.0 pg Final  . MCHC 08/05/2020 33.6  30.0 - 36.0 g/dL Final  . RDW 08/05/2020 11.8  11.5 - 15.5 % Final  . Platelets 08/05/2020 268  150 - 400 K/uL Final  . nRBC 08/05/2020 0.0  0.0 - 0.2 % Final  . Neutrophils Relative % 08/05/2020 55  % Final  . Neutro Abs 08/05/2020 3.5  1.7 - 7.7 K/uL Final  . Lymphocytes Relative 08/05/2020 34  % Final  . Lymphs Abs 08/05/2020 2.2  0.7 - 4.0 K/uL Final  . Monocytes Relative 08/05/2020 7  % Final  . Monocytes Absolute 08/05/2020 0.4  0.1 - 1.0 K/uL Final  . Eosinophils Relative 08/05/2020 2  % Final  . Eosinophils Absolute 08/05/2020 0.1  0.0 - 0.5 K/uL Final  . Basophils Relative 08/05/2020 2  % Final  . Basophils Absolute 08/05/2020 0.1  0.0 - 0.1 K/uL Final  . Immature Granulocytes 08/05/2020 0  % Final  . Abs Immature Granulocytes 08/05/2020 0.01  0.00 - 0.07 K/uL Final   Performed at [facility], [address], [place], [place] [address]    Assessment:  Terri Tanner is a 60 y.o. female withstage IA right breast cancers/p right simple mastectomy and sentinel node biopsy on 05/05/2019.  Pathology revealed a 1.3 x 1.1 x 0.8 cm grade I invasive mammary carcinoma, no special type. Three sentinel lymph nodes were negative.  Tumor was ER positive (>90%). PR positive (40%) and HER2/neu negative.  Pathologic stage was pT1c pN0.  Oncotype DX revealed < 1% benefit of chemotherapy.  She declined endocrine therapy.  She takes many supplements.  CA27.29 was 13.9 on 04/13/2019, 21.9 on 08/22/2019, 26 on 11/27/2019, 23 on 01/05/2020, and 17.8 on 03/19/2020.  Bilateraldiagnosticmammogramon 03/24/2019 revealed a suspicious 1.1 cmmass in the right breastat the 11 o'clock  position and a 0.5 cm oval circumscribed mass in the upper outer left breast.Targeted ultrasoundof the right breast revealed an irregular 1.3 x 0.8 x 1.1 cm hypoechoic mass at 11 o'clock 4 cm from the nipple. Lymph nodes were unremarkable. Targeted ultrasound of the left breast revealed a cluster of microcysts at 2 o'clock 6 cm from nipple measuring 0.7 x 0.2 x 0.7 cm. Left screening mammogram on 03/29/2020 revealed no evidence of malignancy.  Left screening mammogram on 03/29/2020 revealed no evidence of malignancy.  Bone density on 06/08/2019 revealed osteopenia with a T-score of -1.7 in the AP spine L1-L4.   She has a family historyof breast cancer, ovarian cancer, endometrial cancer, brain tumors, and smoking related cancers (lung and head and neck).  [facility] genetic testing on 04/13/2019 was negative.  She has a history ofchronic urticariasince 12/2018 and mild leukocytosissince 02/09/2019. WBChas ranged between 11,100 and 13,600. Differential has been predominantly neutrophils. Etiology is felt reactive. CRP has been elevated.  Work-up on 09/02/2020revealed a hematocrit of 40.8, hemoglobin 13.9, MCV 87.4, platelets 296,000, white count 12,700 with an [place] of10,300. Differentialincluded 80% segs, 13% lymphs, 5% monocytes, and1% eosinophils.ANA was negative.Tryptasewas 8.5 (2.2-13.2). SPEP revealed no monoclonal protein. IgG was 621, IgA 141,IgM 115, andIgE4 (low).  Peripheralsmearrevealed mild leukocytosis with neutrophilia and minimal left shifted maturation.  There is no significant morphologic abnormalities of leukocytes. There was unremarkable RBC morphology and indices. There was normal platelet count and morphology.   She had elevated liver function tests. AST was 58 and ALT 98 on 11/27/2019.  Hepatitis A, B, and C serologies were negative on 11/27/2019  She has chronic urticariaand intermittent angioedema of her lips. Etiology does not appear related  to any medications, herbal products, foods, inset bites, infections, or NSAIDs. She has no apparent autoimmune disease.  She has not received the COVID-19 vaccine.  Symptomatically, she has been "great."  She denies any breast concerns.  She denies any chest wall tenderness. Her back pain has improved. Her rash has completely resolved  Plan: 1.    Labs today: CBC with diff, CMP, CA 27.29. 2.    Stage IA right breast cancer Patient is s/p right mastectomy and SLN on 05/05/2019. Oncotype DX testing- < 1% benefit of chemotherapy.             She declined endocrine therapy.  Clinically, she is doing well.  Exam reveals no evidence of recurrent disease.  Left screening mammogram on 03/29/2020 revealed no evidence of malignancy.  Continue to monitor. 3.Elevated LFTs  AST 58 and ALT 98 on 11/27/2019.  AST 26 and ALT 36 on 08/05/2020.  She is no longer on kombucha tea.  Continue to monitor. 4.   Chronic urticaria Patient notes hives went away around 02/09/2020.  Rash has not recurred.    Etiology may have been due to supplements. 5.Osteopenia Bone density on 06/08/2019 revealed osteopenia with a T-score of -1.7 in the AP spine L1-L4.   Continue calcium and vitamin D.  The patient next bone density on 06/07/2021.   6.   RTC in 4 months for MD assess and labs (CBC with diff, CMP, CA 27.29).  I discussed the assessment and treatment plan with the patient.  The patient was provided an opportunity to ask questions and all were answered.  The patient agreed with the plan and demonstrated an understanding of the instructions.  The patient was advised to call back if the symptoms worsen or if the condition fails to improve as anticipated.   Lequita Asal, MD, PhD    08/05/2020, 10:07 AM  I, Mirian Mo Tufford, am acting as a Education administrator for [employer]. Mike Gip, MD.   I, Virgina Deakins C. Mike Gip, MD, have reviewed the above documentation for  accuracy and completeness, and I agree with the above.

## 2020-08-02 ENCOUNTER — Encounter: Payer: Self-pay | Admitting: Hematology and Oncology

## 2020-08-05 ENCOUNTER — Inpatient Hospital Stay (HOSPITAL_BASED_OUTPATIENT_CLINIC_OR_DEPARTMENT_OTHER): Payer: BLUE CROSS/BLUE SHIELD | Admitting: Hematology and Oncology

## 2020-08-05 ENCOUNTER — Other Ambulatory Visit: Payer: Self-pay

## 2020-08-05 ENCOUNTER — Encounter: Payer: Self-pay | Admitting: Hematology and Oncology

## 2020-08-05 ENCOUNTER — Inpatient Hospital Stay: Payer: BLUE CROSS/BLUE SHIELD | Attending: Hematology and Oncology

## 2020-08-05 VITALS — BP 125/73 | HR 81 | Temp 97.8°F | Resp 18 | Wt 161.7 lb

## 2020-08-05 DIAGNOSIS — L508 Other urticaria: Secondary | ICD-10-CM | POA: Insufficient documentation

## 2020-08-05 DIAGNOSIS — C50411 Malignant neoplasm of upper-outer quadrant of right female breast: Secondary | ICD-10-CM

## 2020-08-05 DIAGNOSIS — R7989 Other specified abnormal findings of blood chemistry: Secondary | ICD-10-CM | POA: Diagnosis not present

## 2020-08-05 DIAGNOSIS — K219 Gastro-esophageal reflux disease without esophagitis: Secondary | ICD-10-CM | POA: Insufficient documentation

## 2020-08-05 DIAGNOSIS — E119 Type 2 diabetes mellitus without complications: Secondary | ICD-10-CM | POA: Diagnosis not present

## 2020-08-05 DIAGNOSIS — E669 Obesity, unspecified: Secondary | ICD-10-CM | POA: Insufficient documentation

## 2020-08-05 DIAGNOSIS — M8580 Other specified disorders of bone density and structure, unspecified site: Secondary | ICD-10-CM | POA: Insufficient documentation

## 2020-08-05 DIAGNOSIS — M8588 Other specified disorders of bone density and structure, other site: Secondary | ICD-10-CM

## 2020-08-05 DIAGNOSIS — Z79899 Other long term (current) drug therapy: Secondary | ICD-10-CM | POA: Diagnosis not present

## 2020-08-05 DIAGNOSIS — Z170 Estrogen receptor positive status [ER+]: Secondary | ICD-10-CM | POA: Diagnosis not present

## 2020-08-05 DIAGNOSIS — Z9011 Acquired absence of right breast and nipple: Secondary | ICD-10-CM | POA: Insufficient documentation

## 2020-08-05 DIAGNOSIS — C50511 Malignant neoplasm of lower-outer quadrant of right female breast: Secondary | ICD-10-CM | POA: Diagnosis present

## 2020-08-05 LAB — CBC WITH DIFFERENTIAL/PLATELET
Abs Immature Granulocytes: 0.01 10*3/uL (ref 0.00–0.07)
Basophils Absolute: 0.1 10*3/uL (ref 0.0–0.1)
Basophils Relative: 2 %
Eosinophils Absolute: 0.1 10*3/uL (ref 0.0–0.5)
Eosinophils Relative: 2 %
HCT: 42.5 % (ref 36.0–46.0)
Hemoglobin: 14.3 g/dL (ref 12.0–15.0)
Immature Granulocytes: 0 %
Lymphocytes Relative: 34 %
Lymphs Abs: 2.2 10*3/uL (ref 0.7–4.0)
MCH: 29.2 pg (ref 26.0–34.0)
MCHC: 33.6 g/dL (ref 30.0–36.0)
MCV: 86.9 fL (ref 80.0–100.0)
Monocytes Absolute: 0.4 10*3/uL (ref 0.1–1.0)
Monocytes Relative: 7 %
Neutro Abs: 3.5 10*3/uL (ref 1.7–7.7)
Neutrophils Relative %: 55 %
Platelets: 268 10*3/uL (ref 150–400)
RBC: 4.89 MIL/uL (ref 3.87–5.11)
RDW: 11.8 % (ref 11.5–15.5)
WBC: 6.4 10*3/uL (ref 4.0–10.5)
nRBC: 0 % (ref 0.0–0.2)

## 2020-08-05 LAB — COMPREHENSIVE METABOLIC PANEL
ALT: 36 U/L (ref 0–44)
AST: 26 U/L (ref 15–41)
Albumin: 4.4 g/dL (ref 3.5–5.0)
Alkaline Phosphatase: 85 U/L (ref 38–126)
Anion gap: 12 (ref 5–15)
BUN: 12 mg/dL (ref 6–20)
CO2: 25 mmol/L (ref 22–32)
Calcium: 9.8 mg/dL (ref 8.9–10.3)
Chloride: 101 mmol/L (ref 98–111)
Creatinine, Ser: 0.63 mg/dL (ref 0.44–1.00)
GFR, Estimated: >60 mL/min (ref >60)
Glucose, Bld: 118 mg/dL — ABNORMAL HIGH (ref 70–99)
Potassium: 4.2 mmol/L (ref 3.5–5.1)
Sodium: 138 mmol/L (ref 135–145)
Total Bilirubin: 0.5 mg/dL (ref 0.3–1.2)
Total Protein: 7.8 g/dL (ref 6.5–8.1)

## 2020-08-05 NOTE — Progress Notes (Signed)
Patient here for oncology follow-up appointment, expresses no complaints or concerns at this time.    

## 2020-08-06 LAB — CANCER ANTIGEN 27.29: CA 27.29: 19.1 U/mL (ref 0.0–38.6)

## 2020-11-03 ENCOUNTER — Other Ambulatory Visit: Payer: Self-pay | Admitting: Family Medicine

## 2020-11-03 DIAGNOSIS — E1169 Type 2 diabetes mellitus with other specified complication: Secondary | ICD-10-CM

## 2020-11-03 DIAGNOSIS — E781 Pure hyperglyceridemia: Secondary | ICD-10-CM

## 2020-11-04 ENCOUNTER — Encounter: Payer: Self-pay | Admitting: Family Medicine

## 2020-11-04 ENCOUNTER — Other Ambulatory Visit: Payer: Self-pay

## 2020-11-04 ENCOUNTER — Ambulatory Visit: Payer: BLUE CROSS/BLUE SHIELD | Admitting: Family Medicine

## 2020-11-04 VITALS — BP 128/76 | HR 88 | Temp 98.0°F | Resp 16 | Ht 62.0 in | Wt 161.0 lb

## 2020-11-04 DIAGNOSIS — Z853 Personal history of malignant neoplasm of breast: Secondary | ICD-10-CM

## 2020-11-04 DIAGNOSIS — E1169 Type 2 diabetes mellitus with other specified complication: Secondary | ICD-10-CM | POA: Diagnosis not present

## 2020-11-04 DIAGNOSIS — Z79899 Other long term (current) drug therapy: Secondary | ICD-10-CM | POA: Diagnosis not present

## 2020-11-04 DIAGNOSIS — K219 Gastro-esophageal reflux disease without esophagitis: Secondary | ICD-10-CM

## 2020-11-04 DIAGNOSIS — E559 Vitamin D deficiency, unspecified: Secondary | ICD-10-CM | POA: Diagnosis not present

## 2020-11-04 DIAGNOSIS — E785 Hyperlipidemia, unspecified: Secondary | ICD-10-CM

## 2020-11-04 DIAGNOSIS — L509 Urticaria, unspecified: Secondary | ICD-10-CM

## 2020-11-04 DIAGNOSIS — E781 Pure hyperglyceridemia: Secondary | ICD-10-CM | POA: Diagnosis not present

## 2020-11-04 LAB — POCT GLYCOSYLATED HEMOGLOBIN (HGB A1C): Hemoglobin A1C: 6.4 % — AB (ref 4.0–5.6)

## 2020-11-04 NOTE — Progress Notes (Signed)
Name: Terri Tanner   MRN: [id number]    DOB: 27-Jul-1960   Date:11/04/2020       Progress Note  Subjective  Chief Complaint  Follow Up  HPI  Hives: going on since July 2020  seen by Dr. Donneta Romberg, had negative allergy testing, she weaned self off medications completely since August 2021  she is off Pepcid, Hydroxyzine and Xyzal and is doing well. She states not hives episodes since 01/2020   Breast Cancer right side:she has mastectomy done  ( Dr. Fleet Contras ), Nov 13 th, 2020  , she did not have chemo or radiation therapy, she is seeing an integrative medicine practice called [facility] in [facility] and is taking some supplements. She is seeing Dr. Mike Gip every 4 months.   Diabetes with dyslipidemia:she never took medication, initial diagnosis was in 2019 A1C was 2019. She follows a low sugar diet. She denies polyphagia, polydipsia or polyuria. She has dyslipidemia, she has been taking pravastatin and lovaza as prescribed, very high triglycerides on her last visit , we will recheck labs today. She does not check glucose at home. She refuses immunizations Advised yearly eye exam   Vitamin D deficiency: she is taking supplementation daily , last level normal and we will not recheck it   GERD: doing well on digestive enzymes that she takes for one week a month, no side effects, no heart burn or indigestion   Patient Active Problem List   Diagnosis Date Noted  . Abnormal liver function tests 12/16/2019  . Elevated liver function tests 12/09/2019  . Osteopenia of lumbar spine 08/22/2019  . Malignant neoplasm of upper-outer quadrant of right breast in female, estrogen receptor positive ([place]) 04/13/2019  . Urticaria 02/22/2019  . Long-term use of high-risk medication 05/12/2018  . Rectal polyp   . Diabetes mellitus type 2 in obese ([place]) 02/06/2018  . Dyslipidemia associated with type 2 diabetes mellitus ([place]) 02/06/2018  . GERD (gastroesophageal reflux disease) 02/04/2018  . Vitamin  D deficiency 02/04/2018  . Chronic cough 02/04/2018  . Obesity (BMI 30-39.9) 02/04/2018    Past Surgical History:  Procedure Laterality Date  . BREAST BIOPSY Right 04/07/2019  . CERVICAL POLYPECTOMY    . CESAREAN SECTION    . COLONOSCOPY WITH PROPOFOL N/A 03/03/2018   Procedure: COLONOSCOPY WITH PROPOFOL;  Surgeon: Jonathon Bellows, MD;  [facility] ENDOSCOPY;  Service: Gastroenterology;  Laterality: N/A;  . MASTECTOMY Right 05/04/2020  . SENTINEL NODE BIOPSY Right 05/05/2019   Procedure: SENTINEL NODE BIOPSY;  Surgeon: Robert Bellow, MD;  Location: [facility] ORS;  Service: General;  Laterality: Right;  . SIMPLE MASTECTOMY WITH AXILLARY SENTINEL NODE BIOPSY Right 05/05/2019   Procedure: SIMPLE MASTECTOMY;  Surgeon: Robert Bellow, MD;  Location: [facility] ORS;  Service: General;  Laterality: Right;  . TRANSANAL EXCISION OF RECTAL MASS N/A 04/14/2018   Procedure: TRANSANAL EXCISION OF RECTAL POLYP;  Surgeon: Jules Husbands, MD;  Location: [facility] ORS;  Service: General;  Laterality: N/A;  . WISDOM TOOTH EXTRACTION    . WISDOM TOOTH EXTRACTION      Family History  Problem Relation Age of Onset  . Lung cancer Mother        smoked for many years  . COPD Mother   . Emphysema Mother   . Esophageal cancer Father        smoker  . Suicidality Father   . Cancer Sister   . Stroke Brother   . Alcohol abuse Brother   . Other Brother  huge smoker  . Diabetes Sister   . Breast cancer Neg Hx     Social History   Tobacco Use  . Smoking status: Never Smoker  . Smokeless tobacco: Never Used  Substance Use Topics  . Alcohol use: Not Currently    Comment: rarely     Current Outpatient Medications:  .  Acetylcysteine (NAC PO), Take 2,400 mg/day by mouth., Disp: , Rfl:  .  Ascorbic Acid (VITAMIN C) 1000 MG tablet, Take 1,000 mg by mouth daily. 4000 mg/day, Disp: , Rfl:  .  Barberry-Oreg Grape-Goldenseal (BERBERINE COMPLEX PO), Take by mouth., Disp: , Rfl:  .  Cholecalciferol (VITAMIN  D3 PO), Take 7,500 Units/day by mouth., Disp: , Rfl:  .  Digestive Enzymes (BETAINE HCL PO), Take by mouth., Disp: , Rfl:  .  Digestive Enzymes (DIGESTIVE ENZYME PO), Take by mouth., Disp: , Rfl:  .  MAGNESIUM MALATE PO, Take by mouth. nightly, Disp: , Rfl:  .  MELATONIN PO, Take 20 mg by mouth. nightly, Disp: , Rfl:  .  Menaquinone-7 (VITAMIN K2 PO), Take 150 mcg/day by mouth., Disp: , Rfl:  .  Methylsulfonylmethane (MSM PO), Take 4,000 mg/day by mouth., Disp: , Rfl:  .  Multiple Vitamin (MULTIVITAMIN) tablet, Take 1 tablet by mouth daily., Disp: , Rfl:  .  NON FORMULARY, Liposomal glutathione 250mg /day, Disp: , Rfl:  .  Nutritional Supplements (CALCIUM D-GLUCARATE PO), Take 150 mg by mouth every 30 (thirty) days. (DGL), Disp: , Rfl:  .  omega-3 acid ethyl esters (LOVAZA) 1 g capsule, Take 2 capsules (2 g total) by mouth 2 (two) times daily., Disp: 360 capsule, Rfl: 1 .  OVER THE COUNTER MEDICATION, ESTROGEN CONTROL, Disp: , Rfl:  .  pravastatin (PRAVACHOL) 20 MG tablet, TAKE ONE TABLET BY MOUTH AT BEDTIME (Patient taking differently: 40 mg.), Disp: 90 tablet, Rfl: 1  Allergies  Allergen Reactions  . Atorvastatin Hives and Swelling    I personally reviewed active problem list, medication list, allergies, family history, social history, health maintenance with the patient/caregiver today.   ROS  Constitutional: Negative for fever or weight change.  Respiratory: Negative for cough and shortness of breath.   Cardiovascular: Negative for chest pain or palpitations.  Gastrointestinal: Negative for abdominal pain, no bowel changes.  Musculoskeletal: Negative for gait problem or joint swelling.  Skin: Negative for rash.  Neurological: Negative for dizziness or headache.  No other specific complaints in a complete review of systems (except as listed in HPI above).  Objective  Vitals:   11/04/20 0806  BP: 128/76  Pulse: 88  Resp: 16  Temp: 98 F (36.7 C)  TempSrc: Oral  SpO2: 99%   Weight: 161 lb (73 kg)  Height: 5\' 2"  (1.575 m)    Body mass index is 29.45 kg/m.  Physical Exam  Constitutional: Patient appears well-developed and well-nourished. Overweight.  No distress.  HEENT: head atraumatic, normocephalic, pupils equal and reactive to light,  neck supple Cardiovascular: Normal rate, regular rhythm and normal heart sounds.  No murmur heard. No BLE edema. Pulmonary/Chest: Effort normal and breath sounds normal. No respiratory distress. Abdominal: Soft.  There is no tenderness. Psychiatric: Patient has a normal mood and affect. behavior is normal. Judgment and thought content normal.   Recent Results (from the past 2160 hour(s))  POCT HgB A1C     Status: Abnormal   Collection Time: 11/04/20  8:09 AM  Result Value Ref Range   Hemoglobin A1C 6.4 (A) 4.0 - 5.6 %   HbA1c  POC (<> result, manual entry)     HbA1c, POC (prediabetic range)     HbA1c, POC (controlled diabetic range)      Diabetic Foot Exam: Diabetic Foot Exam - Simple   Simple Foot Form Diabetic Foot exam was performed with the following findings: Yes 11/04/2020  8:25 AM  Visual Inspection No deformities, no ulcerations, no other skin breakdown bilaterally: Yes Sensation Testing Intact to touch and monofilament testing bilaterally: Yes Pulse Check Posterior Tibialis and Dorsalis pulse intact bilaterally: Yes Comments      PHQ2/9: Depression screen [facility] 2/9 11/04/2020 05/06/2020 05/06/2020 11/01/2019 05/04/2019  Decreased Interest 0 0 0 0 0  Down, Depressed, Hopeless 0 0 0 0 0  PHQ - 2 Score 0 0 0 0 0  Altered sleeping - 0 - 0 0  Tired, decreased energy - 0 - 0 0  Change in appetite - 0 - 0 0  Feeling bad or failure about yourself  - 0 - 0 0  Trouble concentrating - 0 - 0 0  Moving slowly or fidgety/restless - 0 - 0 0  Suicidal thoughts - 0 - 0 0  PHQ-9 Score - 0 - 0 0  Difficult doing work/chores - Not difficult at all - - -    phq 9 is negative   Fall Risk: Fall Risk  11/04/2020  05/06/2020 11/01/2019 05/04/2019 02/09/2019  Falls in the past year? 1 0 0 0 0  Number falls in past yr: 0 0 0 0 0  Injury with Fall? 0 0 0 0 0  Follow up - - - - -     Functional Status Survey: Is the patient deaf or have difficulty hearing?: No Does the patient have difficulty seeing, even when wearing glasses/contacts?: No Does the patient have difficulty concentrating, remembering, or making decisions?: No Does the patient have difficulty walking or climbing stairs?: No Does the patient have difficulty dressing or bathing?: No Does the patient have difficulty doing errands alone such as visiting a doctor's office or shopping?: No    Assessment & Plan  1. Dyslipidemia associated with type 2 diabetes mellitus ([place])  - POCT HgB A1C - HM Diabetes Foot Exam - Lipid panel  2. Vitamin D deficiency  On high dose supplementation   3. Hypertriglyceridemia  - Lipid panel  4. Long-term use of high-risk medication  Reviewed labs recently done by Dr. Mike Gip   5. GERD without esophagitis  Doing well   6. History of right breast cancer  Compliant with follow ups   7. Hives  Last episode 01/2020

## 2020-11-05 LAB — LIPID PANEL
Cholesterol: 205 mg/dL — ABNORMAL HIGH (ref <200)
HDL: 51 mg/dL (ref >=50)
LDL Cholesterol (Calc): 120 mg/dL (calc) — ABNORMAL HIGH
Non-HDL Cholesterol (Calc): 154 mg/dL (calc) — ABNORMAL HIGH (ref <130)
Total CHOL/HDL Ratio: 4 (calc) (ref <5.0)
Triglycerides: 218 mg/dL — ABNORMAL HIGH (ref <150)

## 2020-11-07 ENCOUNTER — Encounter: Payer: Self-pay | Admitting: Family Medicine

## 2020-11-07 ENCOUNTER — Other Ambulatory Visit: Payer: Self-pay | Admitting: Family Medicine

## 2020-11-07 DIAGNOSIS — E781 Pure hyperglyceridemia: Secondary | ICD-10-CM

## 2020-11-07 DIAGNOSIS — E1169 Type 2 diabetes mellitus with other specified complication: Secondary | ICD-10-CM

## 2020-11-07 DIAGNOSIS — E785 Hyperlipidemia, unspecified: Secondary | ICD-10-CM

## 2020-11-07 MED ORDER — OMEGA-3-ACID ETHYL ESTERS 1 G PO CAPS: 2.0000 g | ORAL_CAPSULE | Freq: Two times a day (BID) | ORAL | 1 refills | Status: DC

## 2020-11-08 ENCOUNTER — Other Ambulatory Visit: Payer: Self-pay | Admitting: Family Medicine

## 2020-11-08 MED ORDER — PRAVASTATIN SODIUM 40 MG PO TABS: 40.0000 mg | ORAL_TABLET | Freq: Every day | ORAL | 1 refills | Status: DC

## 2020-11-09 IMAGING — CR DG CHEST 2V
1 series · 2 of 2 positions shown · non-contrast
Comparison: 11/12/2017 chest radiograph

CLINICAL DATA: Mid chest pain for 1 week.

EXAM:
CHEST - 2 VIEW

[Series 1: dg chest 2 view · 0.14mm/px · 2 of 2 slices shown]
[im 1/2]
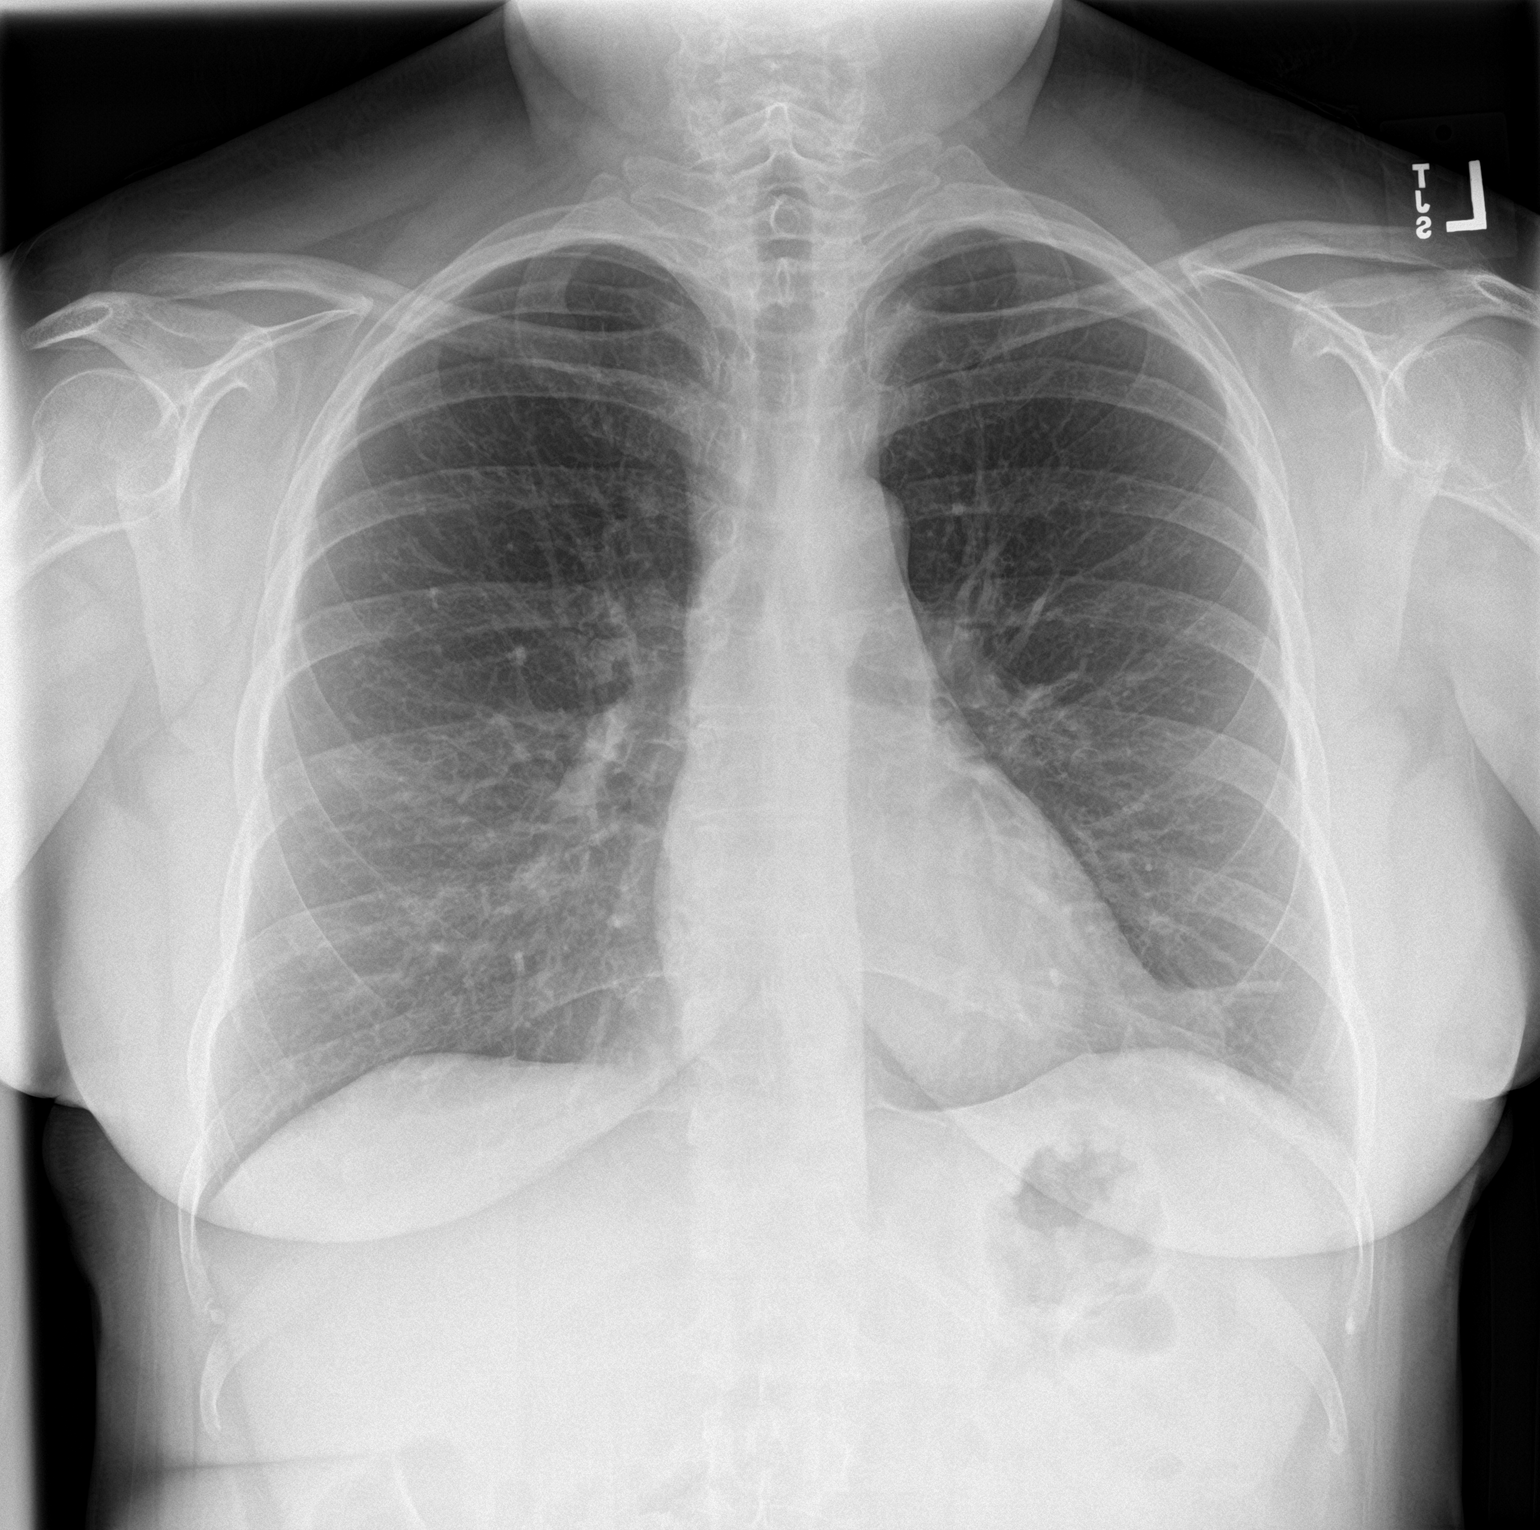
[im 2/2]
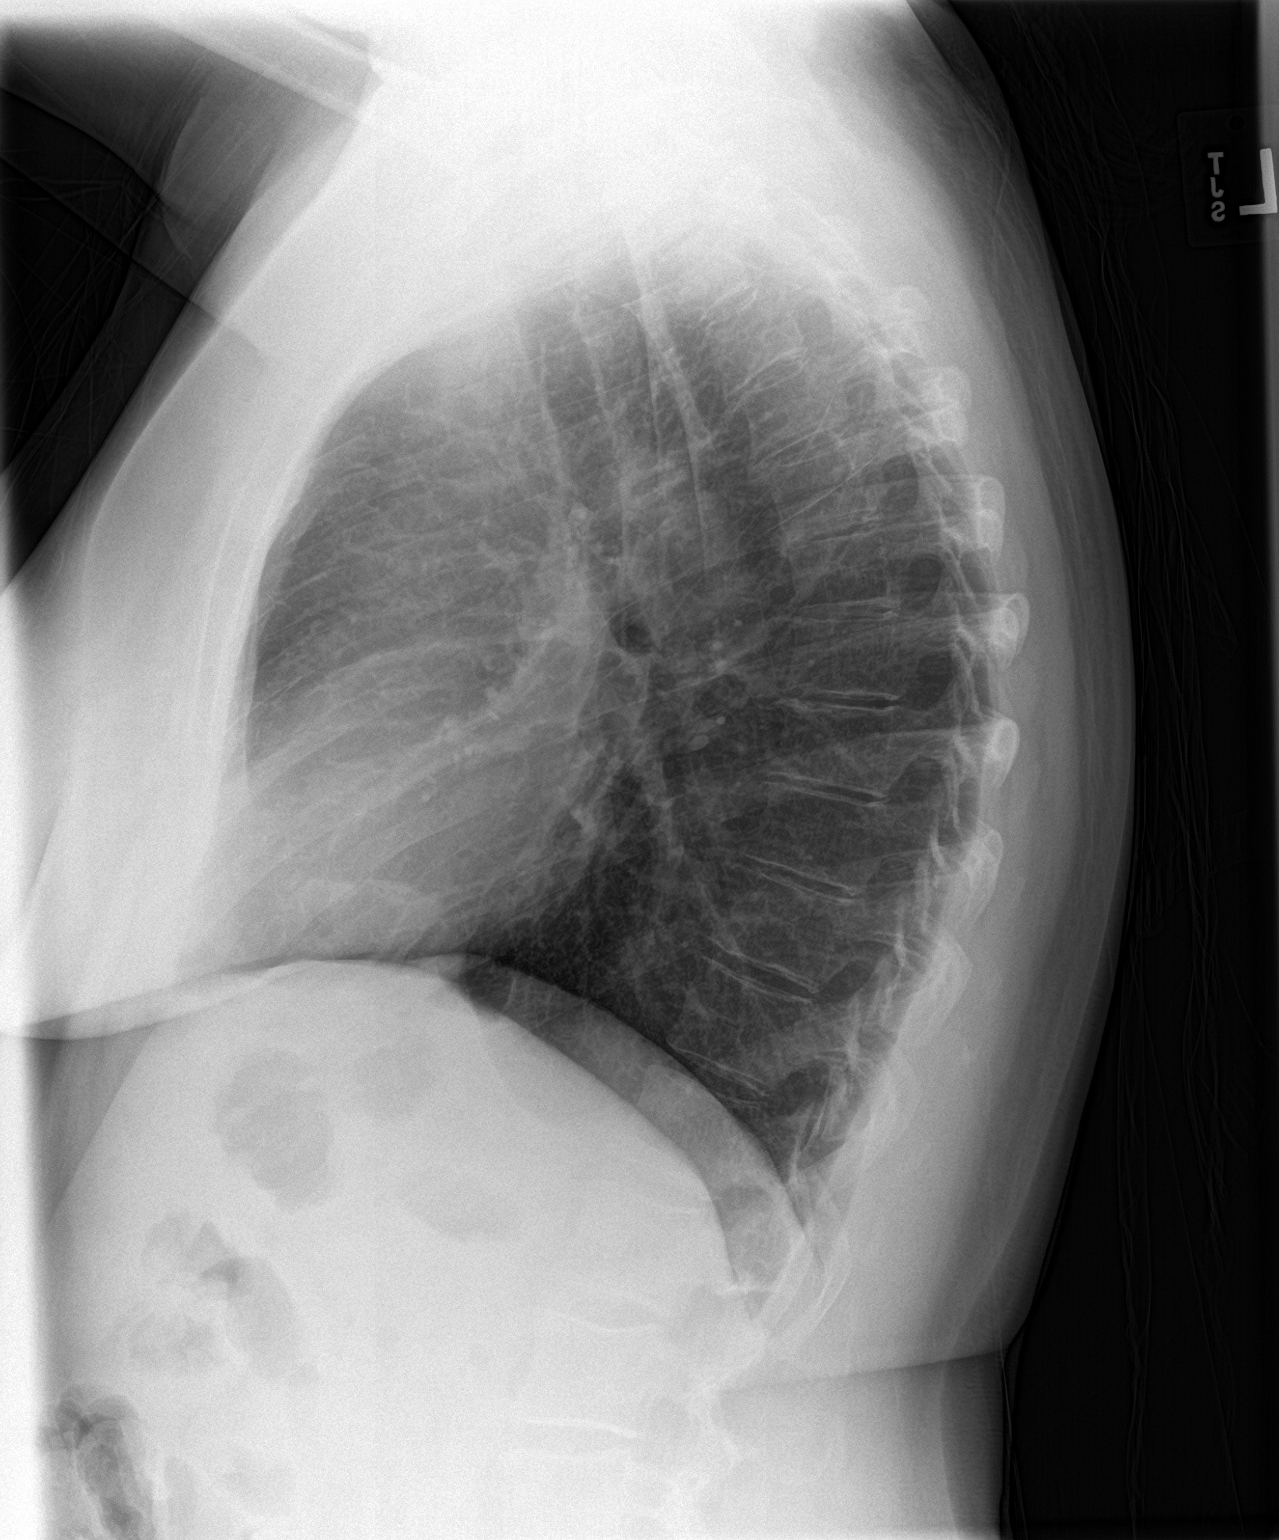

[2 of 2 positions shown; findings below may reference images not displayed]

FINDINGS: The cardiomediastinal silhouette is unchanged.

Lingular opacity again noted, question atelectasis or scarring.

No evidence of pleural effusion, pneumothorax or acute bony
abnormality.
IMPRESSION: Lingular opacity which has the appearance of atelectasis or
scarring.

## 2020-12-09 ENCOUNTER — Other Ambulatory Visit: Payer: BLUE CROSS/BLUE SHIELD

## 2020-12-09 ENCOUNTER — Ambulatory Visit: Payer: BLUE CROSS/BLUE SHIELD | Admitting: Hematology and Oncology

## 2020-12-13 ENCOUNTER — Other Ambulatory Visit: Payer: Self-pay

## 2020-12-13 DIAGNOSIS — C50411 Malignant neoplasm of upper-outer quadrant of right female breast: Secondary | ICD-10-CM

## 2020-12-13 DIAGNOSIS — Z170 Estrogen receptor positive status [ER+]: Secondary | ICD-10-CM

## 2020-12-17 ENCOUNTER — Inpatient Hospital Stay: Payer: BLUE CROSS/BLUE SHIELD | Attending: Nurse Practitioner | Admitting: Nurse Practitioner

## 2020-12-17 ENCOUNTER — Inpatient Hospital Stay: Payer: BLUE CROSS/BLUE SHIELD

## 2020-12-17 ENCOUNTER — Other Ambulatory Visit: Payer: Self-pay

## 2020-12-17 ENCOUNTER — Encounter: Payer: Self-pay | Admitting: Nurse Practitioner

## 2020-12-17 VITALS — BP 124/73 | HR 81 | Temp 97.8°F | Resp 16 | Wt 160.4 lb

## 2020-12-17 DIAGNOSIS — Z853 Personal history of malignant neoplasm of breast: Secondary | ICD-10-CM | POA: Insufficient documentation

## 2020-12-17 DIAGNOSIS — Z9011 Acquired absence of right breast and nipple: Secondary | ICD-10-CM | POA: Insufficient documentation

## 2020-12-17 DIAGNOSIS — Z170 Estrogen receptor positive status [ER+]: Secondary | ICD-10-CM

## 2020-12-17 DIAGNOSIS — Z79899 Other long term (current) drug therapy: Secondary | ICD-10-CM | POA: Insufficient documentation

## 2020-12-17 DIAGNOSIS — M8580 Other specified disorders of bone density and structure, unspecified site: Secondary | ICD-10-CM | POA: Insufficient documentation

## 2020-12-17 DIAGNOSIS — E119 Type 2 diabetes mellitus without complications: Secondary | ICD-10-CM | POA: Insufficient documentation

## 2020-12-17 LAB — COMPREHENSIVE METABOLIC PANEL
ALT: 39 U/L (ref 0–44)
AST: 29 U/L (ref 15–41)
Albumin: 4.4 g/dL (ref 3.5–5.0)
Alkaline Phosphatase: 85 U/L (ref 38–126)
Anion gap: 8 (ref 5–15)
BUN: 12 mg/dL (ref 8–23)
CO2: 27 mmol/L (ref 22–32)
Calcium: 10.1 mg/dL (ref 8.9–10.3)
Chloride: 102 mmol/L (ref 98–111)
Creatinine, Ser: 0.68 mg/dL (ref 0.44–1.00)
GFR, Estimated: >60 mL/min (ref >60)
Glucose, Bld: 119 mg/dL — ABNORMAL HIGH (ref 70–99)
Potassium: 4.4 mmol/L (ref 3.5–5.1)
Sodium: 137 mmol/L (ref 135–145)
Total Bilirubin: 0.6 mg/dL (ref 0.3–1.2)
Total Protein: 7.9 g/dL (ref 6.5–8.1)

## 2020-12-17 LAB — CBC WITH DIFFERENTIAL/PLATELET
Abs Immature Granulocytes: 0.04 10*3/uL (ref 0.00–0.07)
Basophils Absolute: 0.1 10*3/uL (ref 0.0–0.1)
Basophils Relative: 1 %
Eosinophils Absolute: 0.1 10*3/uL (ref 0.0–0.5)
Eosinophils Relative: 2 %
HCT: 41.4 % (ref 36.0–46.0)
Hemoglobin: 14.9 g/dL (ref 12.0–15.0)
Immature Granulocytes: 1 %
Lymphocytes Relative: 38 %
Lymphs Abs: 2.5 10*3/uL (ref 0.7–4.0)
MCH: 30.6 pg (ref 26.0–34.0)
MCHC: 36 g/dL (ref 30.0–36.0)
MCV: 85 fL (ref 80.0–100.0)
Monocytes Absolute: 0.6 10*3/uL (ref 0.1–1.0)
Monocytes Relative: 9 %
Neutro Abs: 3.3 10*3/uL (ref 1.7–7.7)
Neutrophils Relative %: 49 %
Platelets: 254 10*3/uL (ref 150–400)
RBC: 4.87 MIL/uL (ref 3.87–5.11)
RDW: 11.3 % — ABNORMAL LOW (ref 11.5–15.5)
WBC: 6.6 10*3/uL (ref 4.0–10.5)
nRBC: 0 % (ref 0.0–0.2)

## 2020-12-17 NOTE — Progress Notes (Signed)
[facility]  [address], [address] [place], [place] [address] Phone: [phone]  Fax: [phone]   Clinic Day:  12/17/2020  Referring physician: Steele Sizer, MD  Chief Complaint: Terri Tanner is a 60 y.o. female with stage IA right breast cancer s/p mastectomy who is seen for 4 month assessment.  HPI:  Terri Tanner presented after routine Bilateral diagnostic mammogram on 03/24/2019 revealed a suspicious 1.1 cm mass in the right breast at the 11 o'clock position and a 0.5 cm oval circumscribed mass in the upper outer left breast. Targeted ultrasound of the right breast revealed an irregular 1.3 x 0.8 x 1.1 cm hypoechoic mass at 11 o'clock 4 cm from the nipple.  Lymph nodes were unremarkable.  Targeted ultrasound of the left breast revealed a cluster of microcysts at 2 o'clock 6 cm from nipple measuring 0.7 x 0.2 x 0.7 cm.  Left screening mammogram on 03/29/2020 revealed no evidence of malignancy.    CA 27.29 was 13.9 on 04/13/2019. Not elevated.   She underwent right simple mastectomy with sentinel node biopsy on 05/05/2019. Pathology revealed a 1.3 x 1.1 x 0.8 cm grade I invasive mammary carcinoma, no special type. Three sentinel lymph nodes were negative.  Tumor was ER positive (>90%). PR positive (40%) and HER2/neu negative.  Pathologic stage was pT1c pN0. Consistent with stage IA right breast cancer ER/PR positive, HER2/neu negative.   She has a family history of breast cancer, ovarian cancer, endometrial cancer, brain tumors, and smoking related cancers (lung and head and neck).  [facility] genetic testing on 04/13/2019 was negative.   Oncotype DX revealed < 1% benefit of chemotherapy.  She declined endocrine therapy.    CA27.29 has been followed:  21.9 08/22/2019  26 11/27/2019 23 01/05/2020 17.8 03/19/2020  She has been NED since diagnosis. She is followed by homeopathic doctor in [facility].    Bone density on 06/08/2019 revealed  osteopenia with a T-score of -1.7 in the AP spine L1-L4. She takes calcium and vitamin D.    Interval history: Patient is 60 year old female with above history of breast cancer, s/p mastectomy who returns to clinic for routine surveillance. She is not taking adjuvant hormonal therapy. Performs breast exams. No palpable masses. Feels great. Denies any neurologic complaints. Denies recent fevers or illnesses. Denies any easy bleeding or bruising. No melena or hematochezia. No new lumps or bumps. Reports good appetite and denies weight loss. Denies chest pain. Denies any nausea, vomiting, constipation, or diarrhea. Denies urinary complaints. Patient offers no further specific complaints today.  She is followed by Dr. Bary Castilla annually for left screening mammogram.     Past Medical History:  Diagnosis Date   Allergy    Diabetes mellitus type 2 in obese ([place]) 02/06/2018   Environmental and seasonal allergies    GERD (gastroesophageal reflux disease)     Past Surgical History:  Procedure Laterality Date   BREAST BIOPSY Right 04/07/2019   CERVICAL POLYPECTOMY     CESAREAN SECTION     COLONOSCOPY WITH PROPOFOL N/A 03/03/2018   Procedure: COLONOSCOPY WITH PROPOFOL;  Surgeon: Jonathon Bellows, MD;  Location: [facility] ENDOSCOPY;  Service: Gastroenterology;  Laterality: N/A;   MASTECTOMY Right 05/04/2020   SENTINEL NODE BIOPSY Right 05/05/2019   Procedure: SENTINEL NODE BIOPSY;  Surgeon: Robert Bellow, MD;  Location: [facility] ORS;  Service: General;  Laterality: Right;   SIMPLE MASTECTOMY WITH AXILLARY SENTINEL NODE BIOPSY Right 05/05/2019   Procedure: SIMPLE MASTECTOMY;  Surgeon: Bary Castilla,  [place] Gleason, MD;  Location: [facility] ORS;  Service: General;  Laterality: Right;   TRANSANAL EXCISION OF RECTAL MASS N/A 04/14/2018   Procedure: TRANSANAL EXCISION OF RECTAL POLYP;  Surgeon: Jules Husbands, MD;  Location: [facility] ORS;  Service: General;  Laterality: N/A;   WISDOM TOOTH EXTRACTION     WISDOM TOOTH EXTRACTION       Family History  Problem Relation Age of Onset   Lung cancer Mother        smoked for many years   COPD Mother    Emphysema Mother    Esophageal cancer Father        smoker   Suicidality Father    Cancer Sister    Stroke Brother    Alcohol abuse Brother    Other Brother        huge smoker   Diabetes Sister    Breast cancer Neg Hx     Social History:  reports that she has never smoked. She has never used smokeless tobacco. She reports previous alcohol use. She reports that she does not use drugs. She denies any exposure to radiation or toxins. She lives with her daughter and grandchildren in [place].  She moved in July.  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 The patient is alone today.   Allergies:  Allergies  Allergen Reactions   Atorvastatin Hives and Swelling    Current Medications: Current Outpatient Medications  Medication Sig Dispense Refill   Acetylcysteine (NAC PO) Take 2,400 mg/day by mouth.     Ascorbic Acid (VITAMIN C) 1000 MG tablet Take 1,000 mg by mouth daily. 4000 mg/day     Barberry-Oreg Grape-Goldenseal (BERBERINE COMPLEX PO) Take by mouth.     Cholecalciferol (VITAMIN D3 PO) Take 7,500 Units/day by mouth.     Digestive Enzymes (BETAINE HCL PO) Take by mouth.     Digestive Enzymes (DIGESTIVE ENZYME PO) Take by mouth.     famotidine (PEPCID) 10 MG tablet Take 10 mg by mouth 2 (two) times daily.     levocetirizine (XYZAL) 5 MG tablet Take 5 mg by mouth every evening.     MAGNESIUM MALATE PO Take by mouth. nightly     MELATONIN PO Take 20 mg by mouth. nightly     Menaquinone-7 (VITAMIN K2 PO) Take 150 mcg/day by mouth.     Methylsulfonylmethane (MSM PO) Take 4,000 mg/day by mouth.     Multiple Vitamin (MULTIVITAMIN) tablet Take 1 tablet by mouth daily.     NON FORMULARY Liposomal glutathione 233m/day     Nutritional Supplements (CALCIUM D-GLUCARATE PO) Take 150 mg by mouth every 30 (thirty) days. (DGL)     omega-3 acid ethyl esters (LOVAZA) 1 g capsule  Take 2 capsules (2 g total) by mouth 2 (two) times daily. 360 capsule 1   OVER THE COUNTER MEDICATION ESTROGEN CONTROL     pravastatin (PRAVACHOL) 40 MG tablet Take 1 tablet (40 mg total) by mouth daily. 90 tablet 1   zinc gluconate 50 MG tablet Take 50 mg by mouth daily.     No current facility-administered medications for this visit.    Review of Systems  Constitutional:  Negative for chills, fever, malaise/fatigue and weight loss.  HENT:  Negative for hearing loss, nosebleeds, sore throat and tinnitus.   Eyes:  Negative for blurred vision and double vision.  Respiratory:  Negative for cough, hemoptysis, shortness of breath and wheezing.   Cardiovascular:  Negative for chest pain, palpitations and leg swelling.  Gastrointestinal:  Negative for  abdominal pain, blood in stool, constipation, diarrhea, melena, nausea and vomiting.  Genitourinary:  Negative for dysuria and urgency.  Musculoskeletal:  Negative for back pain, falls, joint pain and myalgias.  Skin:  Negative for itching and rash.  Neurological:  Negative for dizziness, tingling, sensory change, loss of consciousness, weakness and headaches.  Endo/Heme/Allergies:  Negative for environmental allergies. Does not bruise/bleed easily.  Psychiatric/Behavioral:  Negative for depression. The patient is not nervous/anxious and does not have insomnia.   Performance status (ECOG): 0  Vitals Blood pressure 124/73, pulse 81, temperature 97.8 F (36.6 C), resp. rate 16, weight 160 lb 6.2 oz (72.7 kg), SpO2 98 %.   General: Well-developed, well-nourished, no acute distress. Eyes: Pink conjunctiva, anicteric sclera. Lungs: Clear to auscultation bilaterally.  No audible wheezing or coughing Heart: Regular rate and rhythm.  Abdomen: Soft, nontender, nondistended.  Musculoskeletal: No edema, cyanosis, or clubbing. Neuro: Alert, answering all questions appropriately. Cranial nerves grossly intact. Skin: No rashes or petechiae noted. Psych:  Normal affect. Breast exam was performed in seated and lying down position. Patient is status post right mastectomy with well healed surgical scar. Chest wall examined without evidence of palpable masses, lymphadenopathy. No evidence of any palpable masses or lumps in the left breast. No evidence of left axillary adenopathy   Appointment on 12/17/2020  Component Date Value Ref Range Status   WBC 12/17/2020 6.6  4.0 - 10.5 K/uL Final   RBC 12/17/2020 4.87  3.87 - 5.11 MIL/uL Final   Hemoglobin 12/17/2020 14.9  12.0 - 15.0 g/dL Final   HCT 12/17/2020 41.4  36.0 - 46.0 % Final   MCV 12/17/2020 85.0  80.0 - 100.0 fL Final   MCH 12/17/2020 30.6  26.0 - 34.0 pg Final   MCHC 12/17/2020 36.0  30.0 - 36.0 g/dL Final   RDW 12/17/2020 11.3 (A) 11.5 - 15.5 % Final   Platelets 12/17/2020 254  150 - 400 K/uL Final   nRBC 12/17/2020 0.0  0.0 - 0.2 % Final   Neutrophils Relative % 12/17/2020 49  % Final   Neutro Abs 12/17/2020 3.3  1.7 - 7.7 K/uL Final   Lymphocytes Relative 12/17/2020 38  % Final   Lymphs Abs 12/17/2020 2.5  0.7 - 4.0 K/uL Final   Monocytes Relative 12/17/2020 9  % Final   Monocytes Absolute 12/17/2020 0.6  0.1 - 1.0 K/uL Final   Eosinophils Relative 12/17/2020 2  % Final   Eosinophils Absolute 12/17/2020 0.1  0.0 - 0.5 K/uL Final   Basophils Relative 12/17/2020 1  % Final   Basophils Absolute 12/17/2020 0.1  0.0 - 0.1 K/uL Final   Immature Granulocytes 12/17/2020 1  % Final   Abs Immature Granulocytes 12/17/2020 0.04  0.00 - 0.07 K/uL Final   Performed at [facility], [address], [place], [place] [address]   Sodium 12/17/2020 137  135 - 145 mmol/L Final   Potassium 12/17/2020 4.4  3.5 - 5.1 mmol/L Final   Chloride 12/17/2020 102  98 - 111 mmol/L Final   CO2 12/17/2020 27  22 - 32 mmol/L Final   Glucose, Bld 12/17/2020 119 (A) 70 - 99 mg/dL Final   Glucose reference range applies only to samples taken after fasting for at least 8 hours.   BUN 12/17/2020 12  8 -  23 mg/dL Final   Creatinine, Ser 12/17/2020 0.68  0.44 - 1.00 mg/dL Final   Calcium 12/17/2020 10.1  8.9 - 10.3 mg/dL Final   Total Protein 12/17/2020 7.9  6.5 - 8.1 g/dL Final   Albumin 12/17/2020 4.4  3.5 - 5.0 g/dL Final   AST 12/17/2020 29  15 - 41 U/L Final   ALT 12/17/2020 39  0 - 44 U/L Final   Alkaline Phosphatase 12/17/2020 85  38 - 126 U/L Final   Total Bilirubin 12/17/2020 0.6  0.3 - 1.2 mg/dL Final   GFR, Estimated 12/17/2020 >60  >60 mL/min Final   Comment: (NOTE) Calculated using the CKD-EPI Creatinine Equation (2021)    Anion gap 12/17/2020 8  5 - 15 Final   Performed at [facility], [address] [place], [place] [address]    Assessment:       2.    Stage IA ER/PR positive, HER2/neu negative right breast cancer- s/p right mastectomy and SLN biopsy on 05/05/2019. No benefit from chemotherapy based on oncotype dx testing. She declined endocrine therapy. Clinically no evidence of recurrent disease. If concerning symptoms for right chest wall recurrence, would recommend right breast MRI. Left breast is clinically NED. Continue annual left breast mammograms. We reviewed NCCN surveillance guidelines today. Recommend H&P 1-4 times a year for first 5 years then annually thereafter. Genetic testing was negative. No evidence of lymphedema today. Mammogram every 12 months. No indication for lab or imaging for metastases screening. CA27.29 was not indicated at time of diagnosis. Can consider checking though clinical usefulness if uncertain. Reviewed that active lifestyle, healthy diet, limited alcohol intake, and achieving and maintaining an ideal body weight with bmi of 20-25 may lead to optimal breast cancer outcomes. Particularly in her HR positive disease and declining AI therapy I feel this is important. Also avoid estrogen, phytoestrogen supplements. I don't see that Dr. Bary Castilla has scheduled follow up or ordered imaging so I will do that today. Will plan for her to  establish care with a physician at her next surveillance appt in 4 months.  3.   Elevated LFTs- resolved. Negative hepatitis workup. Follow up with PCP. No indication to monitor cmp for hx of breast cancer. Will defer to pcp.  4.   Chronic urticaria - resolved. Now recurred. Mild. Unlikely to be related to hx of breast cancer. Follow up with pcp and allergist.  5.   Osteopenia - Bone density on 06/08/2019 revealed osteopenia with a T-score of -1.7 in the AP spine L1-L4. Continue calcium and vitamin D along with weight bearing exercise as tolerated. Plan for bone density exam in 06/07/2021. Patient can be followed for osteopenia by her pcp as she has not received chemotherapy and is not taking hormonal therapy.  6. Leukocytosis- resolved. Benign workup and etiology felt to be reactive. Follow up with pcp. No indication to follow cbc for hx of breast cancer. Will defer to pcp.   Plan: 4 months mammogram left then few days later, MD to establish care  I discussed the assessment and treatment plan with the patient.  The patient was provided an opportunity to ask questions and all were answered.  The patient agreed with the plan and demonstrated an understanding of the instructions.  The patient was advised to call back if the symptoms worsen or if the condition fails to improve as anticipated.  I discussed the assessment and treatment plan with the patient. The patient was provided an opportunity to ask questions and all were answered. The patient agreed with the plan and demonstrated an understanding of the instructions.   The patient was advised to call back or seek an in-person evaluation if the  symptoms worsen or if the condition fails to improve as anticipated.   I spent 40 minutes face-to-face visit time dedicated to the care of this patient on the date of this encounter to including pre-visit review of prior imaging, medical oncology notes, surgery/[place] note, face-to-face time with the patient,  and post visit ordering of testing/documentation.    Beckey Rutter, DNP, AGNP-[facility]: Dr. Ancil Boozer

## 2020-12-17 NOTE — Progress Notes (Signed)
last monday pt had a fever that brought hives back: had hives for 13 months has been less than a year since they have been away. Not sure what has brought them back, not allergic to anything. started taking xyzal and pepcid once again since she had some left over from the previous hives episodes.

## 2020-12-18 LAB — CANCER ANTIGEN 27.29: CA 27.29: 19.5 U/mL (ref 0.0–38.6)

## 2021-02-14 LAB — HM DIABETES EYE EXAM

## 2021-04-08 ENCOUNTER — Ambulatory Visit
Admission: RE | Admit: 2021-04-08 | Discharge: 2021-04-08 | Disposition: A | Discharge status: Home / Self Care | Payer: BLUE CROSS/BLUE SHIELD | Source: Ambulatory Visit | Attending: Nurse Practitioner | Admitting: Nurse Practitioner

## 2021-04-08 ENCOUNTER — Other Ambulatory Visit: Payer: Self-pay

## 2021-04-08 DIAGNOSIS — Z853 Personal history of malignant neoplasm of breast: Secondary | ICD-10-CM | POA: Diagnosis present

## 2021-04-08 DIAGNOSIS — Z1231 Encounter for screening mammogram for malignant neoplasm of breast: Secondary | ICD-10-CM | POA: Insufficient documentation

## 2021-04-16 ENCOUNTER — Other Ambulatory Visit: Payer: Self-pay

## 2021-04-16 ENCOUNTER — Inpatient Hospital Stay: Payer: BLUE CROSS/BLUE SHIELD | Attending: Oncology | Admitting: Oncology

## 2021-04-16 ENCOUNTER — Encounter: Payer: Self-pay | Admitting: Oncology

## 2021-04-16 ENCOUNTER — Ambulatory Visit: Payer: BLUE CROSS/BLUE SHIELD | Admitting: Oncology

## 2021-04-16 VITALS — BP 122/68 | HR 85 | Temp 97.9°F | Resp 18 | Wt 161.9 lb

## 2021-04-16 DIAGNOSIS — Z853 Personal history of malignant neoplasm of breast: Secondary | ICD-10-CM

## 2021-04-16 DIAGNOSIS — Z9011 Acquired absence of right breast and nipple: Secondary | ICD-10-CM | POA: Diagnosis not present

## 2021-04-16 DIAGNOSIS — Z08 Encounter for follow-up examination after completed treatment for malignant neoplasm: Secondary | ICD-10-CM | POA: Diagnosis not present

## 2021-04-16 DIAGNOSIS — C50911 Malignant neoplasm of unspecified site of right female breast: Secondary | ICD-10-CM | POA: Insufficient documentation

## 2021-04-16 DIAGNOSIS — Z170 Estrogen receptor positive status [ER+]: Secondary | ICD-10-CM | POA: Insufficient documentation

## 2021-04-16 DIAGNOSIS — M545 Low back pain, unspecified: Secondary | ICD-10-CM | POA: Insufficient documentation

## 2021-04-16 DIAGNOSIS — M8580 Other specified disorders of bone density and structure, unspecified site: Secondary | ICD-10-CM | POA: Insufficient documentation

## 2021-04-16 NOTE — Progress Notes (Signed)
Hematology/Oncology Consult note [facility]  [phone] [phone]  Patient Care Team: Steele Sizer, MD as PCP - General (Family Medicine)   Name of the patient: Terri Tanner  [id number]  November 23, 1960   Date of visit: 04/16/21  Diagnosis-history of stage I right breast cancer status postmastectomy  Chief complaint/ Reason for visit-routine follow-up of breast cancer  Heme/Onc history: Patient is a 60 year old female with history of stage Ia right breast cancer s/p right simple mastectomy and sentinel lymph node biopsy in November 2020.Pathology revealed a 1.3 x 1.1 x 0.8 cm grade I invasive mammary carcinoma, no special type. Three sentinel lymph nodes were negative.  Tumor was ER positive (>90%). PR positive (40%) and HER2/neu negative.  Pathologic stage was pT1c pN0.   Oncotype DX revealed < 1% benefit of chemotherapy.  She declined endocrine therapy.  Baseline bone density scan showed osteopenia.  Interval history-overall patient is doing well.  She is not on any hormone therapy at this time.  Patient showed me the list of supplements that she takes including estrogen control supplement which I am unsure as to what it contains.  Has occasional low back pain which is self-limited and usually gets better with activity  ECOG PS- 0 Pain scale- 0   Review of systems- Review of Systems  Constitutional:  Negative for chills, fever, malaise/fatigue and weight loss.  HENT:  Negative for congestion, ear discharge and nosebleeds.   Eyes:  Negative for blurred vision.  Respiratory:  Negative for cough, hemoptysis, sputum production, shortness of breath and wheezing.   Cardiovascular:  Negative for chest pain, palpitations, orthopnea and claudication.  Gastrointestinal:  Negative for abdominal pain, blood in stool, constipation, diarrhea, heartburn, melena, nausea and vomiting.  Genitourinary:  Negative for dysuria, flank pain, frequency,  hematuria and urgency.  Musculoskeletal:  Negative for back pain, joint pain and myalgias.  Skin:  Negative for rash.  Neurological:  Negative for dizziness, tingling, focal weakness, seizures, weakness and headaches.  Endo/Heme/Allergies:  Does not bruise/bleed easily.  Psychiatric/Behavioral:  Negative for depression and suicidal ideas. The patient does not have insomnia.       Allergies  Allergen Reactions   Atorvastatin Hives and Swelling     Past Medical History:  Diagnosis Date   Allergy    Diabetes mellitus type 2 in obese ([place]) 02/06/2018   Environmental and seasonal allergies    GERD (gastroesophageal reflux disease)      Past Surgical History:  Procedure Laterality Date   BREAST BIOPSY Right 04/07/2019   [facility]   CERVICAL POLYPECTOMY     CESAREAN SECTION     COLONOSCOPY WITH PROPOFOL N/A 03/03/2018   Procedure: COLONOSCOPY WITH PROPOFOL;  Surgeon: Jonathon Bellows, MD;  Location: [facility] ENDOSCOPY;  Service: Gastroenterology;  Laterality: N/A;   MASTECTOMY Right 05/04/2020   [facility], clear lymph nodes   SENTINEL NODE BIOPSY Right 05/05/2019   Procedure: SENTINEL NODE BIOPSY;  Surgeon: Robert Bellow, MD;  Location: [facility] ORS;  Service: General;  Laterality: Right;   SIMPLE MASTECTOMY WITH AXILLARY SENTINEL NODE BIOPSY Right 05/05/2019   Procedure: SIMPLE MASTECTOMY;  Surgeon: Robert Bellow, MD;  Location: [facility] ORS;  Service: General;  Laterality: Right;   TRANSANAL EXCISION OF RECTAL MASS N/A 04/14/2018   Procedure: TRANSANAL EXCISION OF RECTAL POLYP;  Surgeon: Jules Husbands, MD;  Location: [facility] ORS;  Service: General;  Laterality: N/A;   WISDOM TOOTH EXTRACTION     WISDOM TOOTH EXTRACTION  Social History   Socioeconomic History   Marital status: Married    Spouse name: Alvester Chou   Number of children: 1   Years of education: Not on file   Highest education level: High school graduate  Occupational History   Occupation: stay at home   Tobacco Use   Smoking  status: Never   Smokeless tobacco: Never  Vaping Use   Vaping Use: Never used  Substance and Sexual Activity   Alcohol use: Not Currently    Comment: rarely   Drug use: Never   Sexual activity: Not Currently    Partners: Male    Birth control/protection: None, Surgical  Other Topics Concern   Not on file  Social History Narrative   Patient  moved here from [place], [place] 07/2017   They moved in with their only daughter to home school the grandchildren   Social Determinants of Health   Financial Resource Strain: Low Risk    Difficulty of Paying Living Expenses: Not hard at all  Food Insecurity: No Food Insecurity   Worried About Charity fundraiser in the Last Year: Never true   [place] in the Last Year: Never true  Transportation Needs: No Transportation Needs   Lack of Transportation (Medical): No   Lack of Transportation (Non-Medical): No  Physical Activity: Insufficiently Active   Days of Exercise per Week: 2 days   Minutes of Exercise per Session: 20 min  Stress: No Stress Concern Present   Feeling of Stress : Not at all  Social Connections: Socially Integrated   Frequency of Communication with Friends and Family: More than three times a week   Frequency of Social Gatherings with Friends and Family: More than three times a week   Attends Religious Services: More than 4 times per year   Active Member of [employer] or Organizations: Yes   Attends Music therapist: More than 4 times per year   Marital Status: Married  Human resources officer Violence: Not At Risk   Fear of Current or Ex-Partner: No   Emotionally Abused: No   Physically Abused: No   Sexually Abused: No    Family History  Problem Relation Age of Onset   Lung cancer Mother        smoked for many years   COPD Mother    Emphysema Mother    Esophageal cancer Father        smoker   Suicidality Father    Cancer Sister    Stroke Brother    Alcohol abuse Brother    Other Brother         huge smoker   Diabetes Sister    Breast cancer Neg Hx      Current Outpatient Medications:    Ascorbic Acid (VITAMIN C) 1000 MG tablet, Take 1,000 mg by mouth daily. 4000 mg/day, Disp: , Rfl:    Barberry-Oreg Grape-Goldenseal (BERBERINE COMPLEX PO), Take by mouth., Disp: , Rfl:    Cholecalciferol (VITAMIN D3 PO), Take 7,500 Units/day by mouth., Disp: , Rfl:    Digestive Enzymes (BETAINE HCL PO), Take by mouth., Disp: , Rfl:    Digestive Enzymes (DIGESTIVE ENZYME PO), Take by mouth., Disp: , Rfl:    MAGNESIUM MALATE PO, Take by mouth. nightly, Disp: , Rfl:    MELATONIN PO, Take 20 mg by mouth. nightly, Disp: , Rfl:    Menaquinone-7 (VITAMIN K2 PO), Take 150 mcg/day by mouth., Disp: , Rfl:    Methylsulfonylmethane (MSM PO), Take 4,000 mg/day  by mouth., Disp: , Rfl:    Multiple Vitamin (MULTIVITAMIN) tablet, Take 1 tablet by mouth daily., Disp: , Rfl:    NON FORMULARY, Liposomal glutathione 250mg /day, Disp: , Rfl:    Nutritional Supplements (CALCIUM D-GLUCARATE PO), Take 150 mg by mouth every 30 (thirty) days. (DGL), Disp: , Rfl:    omega-3 acid ethyl esters (LOVAZA) 1 g capsule, Take 2 capsules (2 g total) by mouth 2 (two) times daily., Disp: 360 capsule, Rfl: 1   OVER THE COUNTER MEDICATION, ESTROGEN CONTROL, Disp: , Rfl:    pravastatin (PRAVACHOL) 40 MG tablet, Take 1 tablet (40 mg total) by mouth daily., Disp: 90 tablet, Rfl: 1   Acetylcysteine (NAC PO), Take 2,400 mg/day by mouth., Disp: , Rfl:   Physical exam:  Vitals:   04/16/21 0950  BP: 122/68  Pulse: 85  Resp: 18  Temp: 97.9 F (36.6 C)  SpO2: 100%  Weight: 161 lb 14.4 oz (73.4 kg)   Physical Exam Cardiovascular:     Rate and Rhythm: Normal rate and regular rhythm.     Heart sounds: Normal heart sounds.  Pulmonary:     Effort: Pulmonary effort is normal.     Breath sounds: Normal breath sounds.  Skin:    General: Skin is warm and dry.  Neurological:     Mental Status: She is alert and oriented to person, place, and  time.     CMP Latest Ref Rng & Units 12/17/2020  Glucose 70 - 99 mg/dL 119(H)  BUN 8 - 23 mg/dL 12  Creatinine 0.44 - 1.00 mg/dL 0.68  Sodium 135 - 145 mmol/L 137  Potassium 3.5 - 5.1 mmol/L 4.4  Chloride 98 - 111 mmol/L 102  CO2 22 - 32 mmol/L 27  Calcium 8.9 - 10.3 mg/dL 10.1  Total Protein 6.5 - 8.1 g/dL 7.9  Total Bilirubin 0.3 - 1.2 mg/dL 0.6  Alkaline Phos 38 - 126 U/L 85  AST 15 - 41 U/L 29  ALT 0 - 44 U/L 39   CBC Latest Ref Rng & Units 12/17/2020  WBC 4.0 - 10.5 K/uL 6.6  Hemoglobin 12.0 - 15.0 g/dL 14.9  Hematocrit 36.0 - 46.0 % 41.4  Platelets 150 - 400 K/uL 254    No images are attached to the encounter.  MM 3D SCREEN BREAST UNI LEFT  Result Date: 04/11/2021 CLINICAL DATA:  Screening. EXAM: DIGITAL SCREENING UNILATERAL LEFT MAMMOGRAM WITH CAD AND TOMOSYNTHESIS TECHNIQUE: Left screening digital craniocaudal and mediolateral oblique mammograms were obtained. Left screening digital breast tomosynthesis was performed. The images were evaluated with computer-aided detection. COMPARISON:  Previous exam(s). ACR Breast Density Category c: The breast tissue is heterogeneously dense, which may obscure small masses. FINDINGS: The patient has had a right mastectomy. There are no findings suspicious for malignancy. IMPRESSION: No mammographic evidence of malignancy. A result letter of this screening mammogram will be mailed directly to the patient. RECOMMENDATION: Screening mammogram in one year.  (Code:SM-L-43M) BI-RADS CATEGORY  1: Negative. Electronically Signed   By: Audie Pinto M.D.   On: 04/11/2021 14:09    Assessment and plan- Patient is a 60 y.o. female with history of stage Ia right breast cancer ER/PR positive HER2 negative here for routine follow-up  Patient is not on any endocrine therapy for her ER positive breast cancer.  Recent mammogram from October 2022 showed no evidence of malignancy.  Patient was seen by Dr. Bary Castilla yesterday andHad a breast exam which was  unremarkable per patient.  She would therefore like to  defer her breast exam today.  I will see her back in 6 months for breast exam  I have asked the patient to send [place] a picture of the estrogen control supplement that she is on to make sure that it does not contain any pro estrogenic compounds.  Also explained to the patient that there would be no role for routine blood work or tumor marker testing for stage I breast cancer.  She will continue to get yearly breast exams with me and Dr. Bary Castilla.  Low back pain: Appears mild and self-limited.  If it worsens with time I will consider getting bone scan   Visit Diagnosis 1. Encounter for follow-up surveillance of breast cancer      Dr. Randa Evens, MD, MPH [facility] at [facility] [phone] 04/16/2021 1:36 PM

## 2021-05-02 ENCOUNTER — Other Ambulatory Visit: Payer: Self-pay | Admitting: Family Medicine

## 2021-05-02 DIAGNOSIS — E785 Hyperlipidemia, unspecified: Secondary | ICD-10-CM

## 2021-05-02 DIAGNOSIS — E781 Pure hyperglyceridemia: Secondary | ICD-10-CM

## 2021-05-02 DIAGNOSIS — E1169 Type 2 diabetes mellitus with other specified complication: Secondary | ICD-10-CM

## 2021-05-02 NOTE — Telephone Encounter (Signed)
Requested Prescriptions  Pending Prescriptions Disp Refills  . omega-3 acid ethyl esters (LOVAZA) 1 g capsule [Pharmacy Med Name: OMEGA-3 ETHYL ESTERS 1 GM CAP] 360 capsule 1    Sig: TAKE TWO CAPSULES BY MOUTH TWICE A DAY     Endocrinology:  Nutritional Agents Passed - 05/02/2021  6:21 AM      Passed - Valid encounter within last 12 months    Recent Outpatient Visits          5 months ago Dyslipidemia associated with type 2 diabetes mellitus [facility])   [facility] Steele Sizer, MD   12 months ago Dyslipidemia associated with type 2 diabetes mellitus [facility])   [facility] [place] [facility], Drue Stager, MD   1 year ago Controlled type 2 diabetes mellitus with complication, without long-term current use of insulin [facility])   [facility], Drue Stager, MD   1 year ago Controlled type 2 diabetes mellitus with complication, without long-term current use of insulin [facility])   [facility] Steele Sizer, MD   2 years ago [facility] Steele Sizer, MD      Future Appointments            In 6 days Steele Sizer, MD [facility], [facility]

## 2021-05-08 ENCOUNTER — Ambulatory Visit (INDEPENDENT_AMBULATORY_CARE_PROVIDER_SITE_OTHER): Payer: BLUE CROSS/BLUE SHIELD | Admitting: Family Medicine

## 2021-05-08 ENCOUNTER — Other Ambulatory Visit: Payer: Self-pay

## 2021-05-08 ENCOUNTER — Other Ambulatory Visit (HOSPITAL_COMMUNITY)
Admission: RE | Admit: 2021-05-08 | Discharge: 2021-05-08 | Disposition: A | Discharge status: Home / Self Care | Payer: BLUE CROSS/BLUE SHIELD | Source: Ambulatory Visit | Attending: Family Medicine | Admitting: Family Medicine

## 2021-05-08 ENCOUNTER — Encounter: Payer: Self-pay | Admitting: Family Medicine

## 2021-05-08 VITALS — BP 126/72 | HR 82 | Temp 98.4°F | Resp 16 | Ht 62.0 in | Wt 158.0 lb

## 2021-05-08 DIAGNOSIS — Z124 Encounter for screening for malignant neoplasm of cervix: Secondary | ICD-10-CM

## 2021-05-08 DIAGNOSIS — Z0000 Encounter for general adult medical examination without abnormal findings: Secondary | ICD-10-CM | POA: Diagnosis not present

## 2021-05-08 DIAGNOSIS — E559 Vitamin D deficiency, unspecified: Secondary | ICD-10-CM | POA: Diagnosis not present

## 2021-05-08 DIAGNOSIS — K219 Gastro-esophageal reflux disease without esophagitis: Secondary | ICD-10-CM

## 2021-05-08 DIAGNOSIS — L509 Urticaria, unspecified: Secondary | ICD-10-CM

## 2021-05-08 DIAGNOSIS — Z23 Encounter for immunization: Secondary | ICD-10-CM

## 2021-05-08 DIAGNOSIS — E781 Pure hyperglyceridemia: Secondary | ICD-10-CM

## 2021-05-08 DIAGNOSIS — Z79899 Other long term (current) drug therapy: Secondary | ICD-10-CM

## 2021-05-08 DIAGNOSIS — E1169 Type 2 diabetes mellitus with other specified complication: Secondary | ICD-10-CM

## 2021-05-08 DIAGNOSIS — E785 Hyperlipidemia, unspecified: Secondary | ICD-10-CM

## 2021-05-08 MED ORDER — PRAVASTATIN SODIUM 40 MG PO TABS: 40.0000 mg | ORAL_TABLET | Freq: Every day | ORAL | 1 refills | Status: DC

## 2021-05-08 NOTE — Progress Notes (Signed)
Name: Terri Tanner   MRN: [id number]    DOB: 03-29-1961   Date:05/08/2021       Progress Note  Subjective  Chief Complaint  Annual Exam  HPI  Patient presents for annual CPE and follow up. She is aware of possible additional cost .  Hives: going on since July 2020  seen by Dr. Donneta Romberg, had negative allergy testing, she weaned self off medications completely since August 2021  she is off Pepcid, Hydroxyzine and Xyzal and is doing well. She states she only had one week of hives this year.   Breast Cancer right side: she has mastectomy done  ( Dr. Fleet Contras ), Nov 13 th, 2020  , she did not have chemo or radiation therapy, she is seeing an integrative medicine practice called [facility] in [facility] and is taking some supplements. She is now seeing Dr. Janese Banks, last visit was this Spring. Mammogram of left side is up to date  Osteopenia: last bone density was done 05/2019 and Frax score was low, recheck in 2025   Diabetes with dyslipidemia: she never took medication, initial diagnosis was in 2019 A1C was 6.8 %  She follows a low sugar diet. She denies polyphagia, polydipsia or polyuria. She has dyslipidemia, she has been taking Pravastatin 40  and Lovaza 4 g daily as prescribed,last triglycerides and LDL improved with current regiment    Vitamin D deficiency: she is taking supplementation daily , last level normal and we will not recheck it    GERD: doing well on digestive enzymes that she takes for one week a month, no side effects, no heart burn or indigestion   Diet: balanced diet  Exercise: discussed importance of being physical active    [place] Visit from 05/06/2020 in [facility]  AUDIT-C Score 0      Depression: Phq 9 is  negative Depression screen [facility] 2/9 05/08/2021 11/04/2020 05/06/2020 05/06/2020 11/01/2019  Decreased Interest 0 0 0 0 0  Down, Depressed, Hopeless 0 0 0 0 0  PHQ - 2 Score 0 0 0 0 0  Altered sleeping 0 - 0 - 0  Tired,  decreased energy 0 - 0 - 0  Change in appetite 0 - 0 - 0  Feeling bad or failure about yourself  0 - 0 - 0  Trouble concentrating 0 - 0 - 0  Moving slowly or fidgety/restless 0 - 0 - 0  Suicidal thoughts 0 - 0 - 0  PHQ-9 Score 0 - 0 - 0  Difficult doing work/chores - - Not difficult at all - -  Some recent data might be hidden   Hypertension: BP Readings from Last 3 Encounters:  05/08/21 126/72  04/16/21 122/68  12/17/20 124/73   Obesity: Wt Readings from Last 3 Encounters:  05/08/21 158 lb (71.7 kg)  04/16/21 161 lb 14.4 oz (73.4 kg)  12/17/20 160 lb 6.2 oz (72.7 kg)   BMI Readings from Last 3 Encounters:  05/08/21 28.90 kg/m  04/16/21 29.61 kg/m  12/17/20 29.33 kg/m     Vaccines:   Shingrix: Refused  Pneumonia: educated and discussed with patient. Flu: Refused   Hep C Screening: 11/27/19 STD testing and prevention (HIV/chl/gon/syphilis): N/A Intimate partner violence: negative Sexual History : not sexually active  Menstrual History/LMP/Abnormal Bleeding: discussed post-menopausal bleeding  Incontinence Symptoms: no problems   Breast cancer:  - Last Mammogram: 04/08/21 - BRCA gene screening: N/A  Osteoporosis: Discussed high calcium and vitamin D supplementation, weight bearing exercises  Cervical cancer screening: Today  Skin cancer: Discussed monitoring for atypical lesions  Colorectal cancer: 03/03/18   Lung cancer: Low Dose CT Chest recommended if Age 76-80 years, 20 pack-year currently smoking OR have quit w/in 15years. Patient does not qualify.   ECG: 07/14/18  Advanced Care Planning: A voluntary discussion about advance care planning including the explanation and discussion of advance directives.  Discussed health care proxy and Living will, and the patient was able to identify a health care proxy as husband.  Patient does not have a living will at present time.   Lipids: Lab Results  Component Value Date   CHOL 205 (H) 11/04/2020   CHOL 264 (H)  05/06/2020   CHOL 247 (H) 12/22/2019   Lab Results  Component Value Date   HDL 51 11/04/2020   HDL 49 (L) 05/06/2020   HDL 44 (L) 12/22/2019   Lab Results  Component Value Date   LDLCALC 120 (H) 11/04/2020   [place]  05/06/2020     Comment:     . LDL cholesterol not calculated. Triglyceride levels greater than 400 mg/dL invalidate calculated LDL results. . Reference range: <100 . Desirable range <100 mg/dL for primary prevention;   <70 mg/dL for patients with CHD or diabetic patients  with > or = 2 CHD risk factors. Marland Kitchen LDL-C is now calculated using the Martin-Hopkins  calculation, which is a validated novel method providing  better accuracy than the Friedewald equation in the  estimation of LDL-C.  Cresenciano Genre et al. Annamaria Helling. 1438;887(57): 2061-2068  (http://education.[facility].com/faq/FAQ164)    [place]  12/22/2019     Comment:     . LDL cholesterol not calculated. Triglyceride levels greater than 400 mg/dL invalidate calculated LDL results. . Reference range: <100 . Desirable range <100 mg/dL for primary prevention;   <70 mg/dL for patients with CHD or diabetic patients  with > or = 2 CHD risk factors. Marland Kitchen LDL-C is now calculated using the Martin-Hopkins  calculation, which is a validated novel method providing  better accuracy than the Friedewald equation in the  estimation of LDL-C.  Cresenciano Genre et al. Annamaria Helling. 9728;206(01): 2061-2068  (http://education.[facility].com/faq/FAQ164)    Lab Results  Component Value Date   TRIG 218 (H) 11/04/2020   TRIG 495 (H) 05/06/2020   TRIG 529 (H) 12/22/2019   Lab Results  Component Value Date   CHOLHDL 4.0 11/04/2020   CHOLHDL 5.4 (H) 05/06/2020   CHOLHDL 5.6 (H) 12/22/2019   No results found for: LDLDIRECT  Glucose: Glucose, Bld  Date Value Ref Range Status  12/17/2020 119 (H) 70 - 99 mg/dL Final    Comment:    Glucose reference range applies only to samples taken after fasting for at least 8 hours.   08/05/2020 118 (H) 70 - 99 mg/dL Final    Comment:    Glucose reference range applies only to samples taken after fasting for at least 8 hours.  03/19/2020 122 (H) 70 - 99 mg/dL Final    Comment:    Glucose reference range applies only to samples taken after fasting for at least 8 hours.   Glucose-Capillary  Date Value Ref Range Status  05/05/2019 105 (H) 70 - 99 mg/dL Final  04/14/2018 116 (H) 70 - 99 mg/dL Final    Patient Active Problem List   Diagnosis Date Noted   Abnormal liver function tests 12/16/2019   Elevated liver function tests 12/09/2019   Osteopenia of lumbar spine 08/22/2019   Malignant neoplasm of upper-outer quadrant of right breast  in female, estrogen receptor positive ([place]) 04/13/2019   Urticaria 02/22/2019   Long-term use of high-risk medication 05/12/2018   Rectal polyp    Diabetes mellitus type 2 in obese ([place]) 02/06/2018   Dyslipidemia associated with type 2 diabetes mellitus ([place]) 02/06/2018   GERD (gastroesophageal reflux disease) 02/04/2018   Vitamin D deficiency 02/04/2018   Chronic cough 02/04/2018   Obesity (BMI 30-39.9) 02/04/2018    Past Surgical History:  Procedure Laterality Date   BREAST BIOPSY Right 04/07/2019   [facility]   CERVICAL POLYPECTOMY     CESAREAN SECTION     COLONOSCOPY WITH PROPOFOL N/A 03/03/2018   Procedure: COLONOSCOPY WITH PROPOFOL;  Surgeon: Jonathon Bellows, MD;  Location: [facility] ENDOSCOPY;  Service: Gastroenterology;  Laterality: N/A;   MASTECTOMY Right 05/04/2020   [facility], clear lymph nodes   SENTINEL NODE BIOPSY Right 05/05/2019   Procedure: SENTINEL NODE BIOPSY;  Surgeon: Robert Bellow, MD;  Location: [facility] ORS;  Service: General;  Laterality: Right;   SIMPLE MASTECTOMY WITH AXILLARY SENTINEL NODE BIOPSY Right 05/05/2019   Procedure: SIMPLE MASTECTOMY;  Surgeon: Robert Bellow, MD;  Location: [facility] ORS;  Service: General;  Laterality: Right;   TRANSANAL EXCISION OF RECTAL MASS N/A 04/14/2018   Procedure: TRANSANAL  EXCISION OF RECTAL POLYP;  Surgeon: Jules Husbands, MD;  Location: [facility] ORS;  Service: General;  Laterality: N/A;   WISDOM TOOTH EXTRACTION     WISDOM TOOTH EXTRACTION      Family History  Problem Relation Age of Onset   Lung cancer Mother        smoked for many years   COPD Mother    Emphysema Mother    Esophageal cancer Father        smoker   Suicidality Father    Cancer Sister    Stroke Brother    Alcohol abuse Brother    Other Brother        huge smoker   Diabetes Sister    Breast cancer Neg Hx     Social History   Socioeconomic History   Marital status: Married    Spouse name: Alvester Chou   Number of children: 1   Years of education: Not on file   Highest education level: High school graduate  Occupational History   Occupation: stay at home   Tobacco Use   Smoking status: Never   Smokeless tobacco: Never  Vaping Use   Vaping Use: Never used  Substance and Sexual Activity   Alcohol use: Not Currently    Comment: rarely   Drug use: Never   Sexual activity: Not Currently    Partners: Male    Birth control/protection: None, Surgical  Other Topics Concern   Not on file  Social History Narrative   Patient  moved here from [place] [employer], [place] 07/2017   They moved in with their only daughter to home school the grandchildren   Social Determinants of Health   Financial Resource Strain: Low Risk    Difficulty of Paying Living Expenses: Not hard at all  Food Insecurity: No Food Insecurity   Worried About Charity fundraiser in the Last Year: Never true   [place] in the Last Year: Never true  Transportation Needs: No Transportation Needs   Lack of Transportation (Medical): No   Lack of Transportation (Non-Medical): No  Physical Activity: Inactive   Days of Exercise per Week: 0 days   Minutes of Exercise per Session: 0 min  Stress: No Stress Concern Present  Feeling of Stress : Only a little  Social Connections: Moderately Integrated   Frequency of  Communication with Friends and Family: Once a week   Frequency of Social Gatherings with Friends and Family: More than three times a week   Attends Religious Services: More than 4 times per year   Active Member of [employer] or Organizations: No   Attends Archivist Meetings: Never   Marital Status: Married  Human resources officer Violence: Not At Risk   Fear of Current or Ex-Partner: No   Emotionally Abused: No   Physically Abused: No   Sexually Abused: No     Current Outpatient Medications:    Ascorbic Acid (VITAMIN C) 1000 MG tablet, Take 1,000 mg by mouth daily. 4000 mg/day, Disp: , Rfl:    Barberry-Oreg Grape-Goldenseal (BERBERINE COMPLEX PO), Take by mouth., Disp: , Rfl:    Cholecalciferol (VITAMIN D3 PO), Take 5,000 Units/day by mouth daily at 12 noon., Disp: , Rfl:    Digestive Enzymes (BETAINE HCL PO), Take 1 tablet by mouth daily. One week per month , Disp: , Rfl:    MAGNESIUM MALATE PO, Take 202.5 g by mouth daily. nightly , Disp: , Rfl:    MELATONIN PO, Take 40 mg by mouth. nightly , Disp: , Rfl:    Menaquinone-7 (VITAMIN K2 PO), Take 45 mcg/day by mouth daily at 12 noon., Disp: , Rfl:    Methylsulfonylmethane (MSM PO), Take 2,000 mg/day by mouth daily at 12 noon., Disp: , Rfl:    Multiple Vitamin (MULTIVITAMIN) tablet, Take 1 tablet by mouth daily., Disp: , Rfl:    NON FORMULARY, Take 500 mg by mouth daily at 12 noon. Liposomal glutathione 250mg /day , Disp: , Rfl:    omega-3 acid ethyl esters (LOVAZA) 1 g capsule, TAKE TWO CAPSULES BY MOUTH TWICE A DAY, Disp: 360 capsule, Rfl: 1   OVER THE COUNTER MEDICATION, ESTROGEN CONTROL, Disp: , Rfl:    pravastatin (PRAVACHOL) 40 MG tablet, Take 1 tablet (40 mg total) by mouth daily., Disp: 90 tablet, Rfl: 1  Allergies  Allergen Reactions   Atorvastatin Hives and Swelling     ROS  Constitutional: Negative for fever or weight change.  Respiratory: Negative for cough and shortness of breath.   Cardiovascular: Negative for chest  pain or palpitations.  Gastrointestinal: Negative for abdominal pain, no bowel changes.  Musculoskeletal: Negative for gait problem or joint swelling.  Skin: Negative for rash.  Neurological: Negative for dizziness or headache.  No other specific complaints in a complete review of systems (except as listed in HPI above).   Objective  Vitals:   05/08/21 0910  BP: 126/72  Pulse: 82  Resp: 16  Temp: 98.4 F (36.9 C)  SpO2: 98%  Weight: 158 lb (71.7 kg)  Height: 5\' 2"  (1.575 m)    Body mass index is 28.9 kg/m.  Physical Exam  Constitutional: Patient appears well-developed and well-nourished. No distress.  HENT: Head: Normocephalic and atraumatic. Ears: B TMs ok, no erythema or effusion; Nose: Not done. Mouth/Throat: not done  Eyes: Conjunctivae and EOM are normal. Pupils are equal, round, and reactive to light. No scleral icterus.  Neck: Normal range of motion. Neck supple. No JVD present. No thyromegaly present.  Cardiovascular: Normal rate, regular rhythm and normal heart sounds.  No murmur heard. No BLE edema. Pulmonary/Chest: Effort normal and breath sounds normal. No respiratory distress. Abdominal: Soft. Bowel sounds are normal, no distension. There is no tenderness. no masses Breast: no lumps or masses, no nipple  discharge or rashes FEMALE GENITALIA:  External genitalia normal External urethra normal Vaginal vault normal without discharge or lesions Cervix normal without discharge or lesions Bimanual exam normal without masses RECTAL:not done  Musculoskeletal: Normal range of motion, no joint effusions. No gross deformities Neurological: he is alert and oriented to person, place, and time. No cranial nerve deficit. Coordination, balance, strength, speech and gait are normal.  Skin: Skin is warm and dry. No rash noted. No erythema.  Psychiatric: Patient has a normal mood and affect. behavior is normal. Judgment and thought content normal.   Recent Results (from the  past 2160 hour(s))  HM DIABETES EYE EXAM     Status: None   Collection Time: 02/14/21 12:00 AM  Result Value Ref Range   HM Diabetic Eye Exam No Retinopathy No Retinopathy     Fall Risk: Fall Risk  05/08/2021 11/04/2020 05/06/2020 11/01/2019 05/04/2019  Falls in the past year? 1 1 0 0 0  Number falls in past yr: 0 0 0 0 0  Injury with Fall? 0 0 0 0 0  Risk for fall due to : No Fall Risks - - - -  Follow up Falls prevention discussed - - - -     Functional Status Survey: Is the patient deaf or have difficulty hearing?: No Does the patient have difficulty seeing, even when wearing glasses/contacts?: No Does the patient have difficulty concentrating, remembering, or making decisions?: No Does the patient have difficulty walking or climbing stairs?: No Does the patient have difficulty dressing or bathing?: No Does the patient have difficulty doing errands alone such as visiting a doctor's office or shopping?: No   Assessment & Plan  1. Well adult exam   2. Cervical cancer screening  - Cytology - PAP  3. Need for Tdap vaccination  refused  4. Dyslipidemia associated with type 2 diabetes mellitus (HCC)  - pravastatin (PRAVACHOL) 40 MG tablet; Take 1 tablet (40 mg total) by mouth daily.  Dispense: 90 tablet; Refill: 1 - Lipid panel - Hemoglobin A1c - Microalbumin / creatinine urine ratio  5. Hypertriglyceridemia   6. Vitamin D deficiency   7. GERD without esophagitis   8. Long-term use of high-risk medication  - COMPLETE METABOLIC PANEL WITH GFR  9. Hives     -USPSTF grade A and B recommendations reviewed with patient; age-appropriate recommendations, preventive care, screening tests, etc discussed and encouraged; healthy living encouraged; see AVS for patient education given to patient -Discussed importance of 150 minutes of physical activity weekly, eat two servings of fish weekly, eat one serving of tree nuts ( cashews, pistachios, pecans, almonds.Marland Kitchen) every  other day, eat 6 servings of fruit/vegetables daily and drink plenty of water and avoid sweet beverages.

## 2021-05-09 LAB — MICROALBUMIN / CREATININE URINE RATIO
Creatinine, Urine: 93 mg/dL (ref 20–275)
Microalb Creat Ratio: 12 mcg/mg creat (ref <30)
Microalb, Ur: 1.1 mg/dL

## 2021-05-09 LAB — COMPLETE METABOLIC PANEL WITH GFR
AG Ratio: 1.8 (calc) (ref 1.0–2.5)
ALT: 31 U/L — ABNORMAL HIGH (ref 6–29)
AST: 22 U/L (ref 10–35)
Albumin: 4.6 g/dL (ref 3.6–5.1)
Alkaline phosphatase (APISO): 79 U/L (ref 37–153)
BUN: 10 mg/dL (ref 7–25)
CO2: 29 mmol/L (ref 20–32)
Calcium: 10.1 mg/dL (ref 8.6–10.4)
Chloride: 101 mmol/L (ref 98–110)
Creat: 0.65 mg/dL (ref 0.50–1.05)
Globulin: 2.6 g/dL (calc) (ref 1.9–3.7)
Glucose, Bld: 121 mg/dL — ABNORMAL HIGH (ref 65–99)
Potassium: 4.6 mmol/L (ref 3.5–5.3)
Sodium: 139 mmol/L (ref 135–146)
Total Bilirubin: 0.5 mg/dL (ref 0.2–1.2)
Total Protein: 7.2 g/dL (ref 6.1–8.1)
eGFR: 100 mL/min/{1.73_m2} (ref >=60)

## 2021-05-09 LAB — LIPID PANEL
Cholesterol: 219 mg/dL — ABNORMAL HIGH (ref <200)
HDL: 52 mg/dL (ref >=50)
LDL Cholesterol (Calc): 123 mg/dL (calc) — ABNORMAL HIGH
Non-HDL Cholesterol (Calc): 167 mg/dL (calc) — ABNORMAL HIGH (ref <130)
Total CHOL/HDL Ratio: 4.2 (calc) (ref <5.0)
Triglycerides: 305 mg/dL — ABNORMAL HIGH (ref <150)

## 2021-05-09 LAB — HEMOGLOBIN A1C
Hgb A1c MFr Bld: 6.3 % of total Hgb — ABNORMAL HIGH (ref <5.7)
Mean Plasma Glucose: 134 mg/dL
eAG (mmol/L): 7.4 mmol/L

## 2021-05-13 LAB — CYTOLOGY - PAP
Comment: NEGATIVE
Diagnosis: NEGATIVE
High risk HPV: NEGATIVE

## 2021-07-15 ENCOUNTER — Telehealth: Payer: Self-pay | Admitting: Oncology

## 2021-07-15 NOTE — Telephone Encounter (Signed)
Pt called to cancel her appt for 4-26. She didn;t want to reschedule.

## 2021-07-20 IMAGING — US US BREAST*R* LIMITED INC AXILLA
1 series · 6 of 6 positions shown · non-contrast
Comparison: Previous exam(s).

CLINICAL DATA: Screening recall for possible bilateral breast
masses.

EXAM:
DIGITAL DIAGNOSTIC BILATERAL MAMMOGRAM WITH CAD AND TOMO
BILATERAL BREAST ULTRASOUND

[Series 1: us breast*right* limited inc axilla · 0.07mm/px · 6 of 6 slices shown]
[im 1/6]
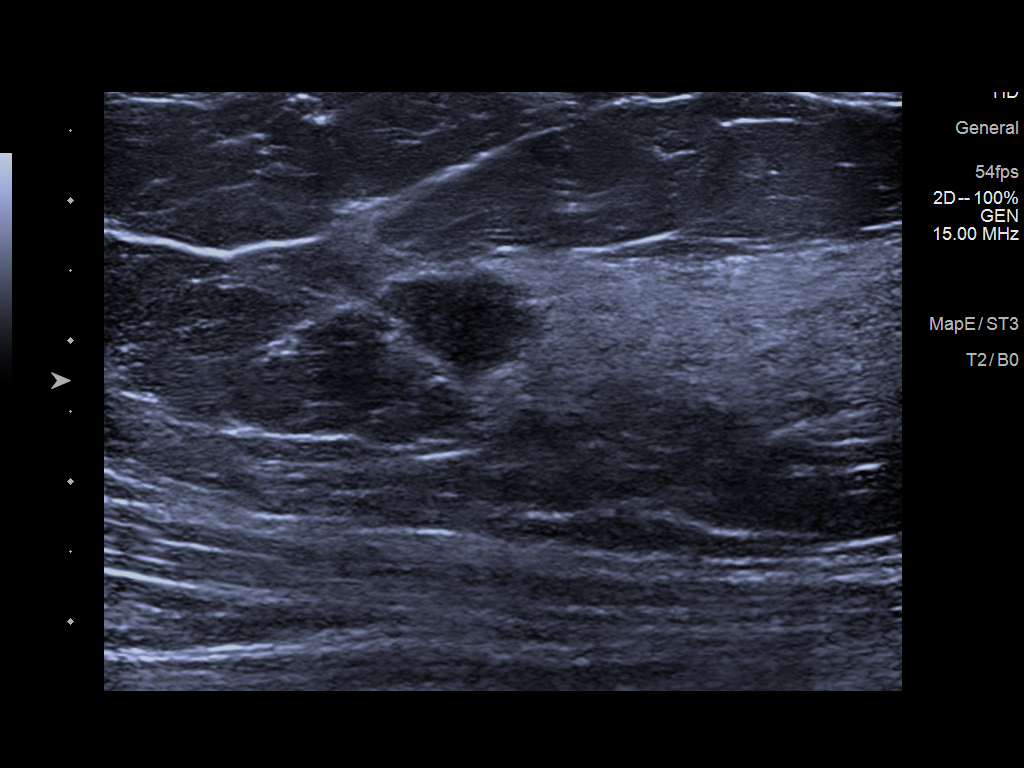
[im 2/6]
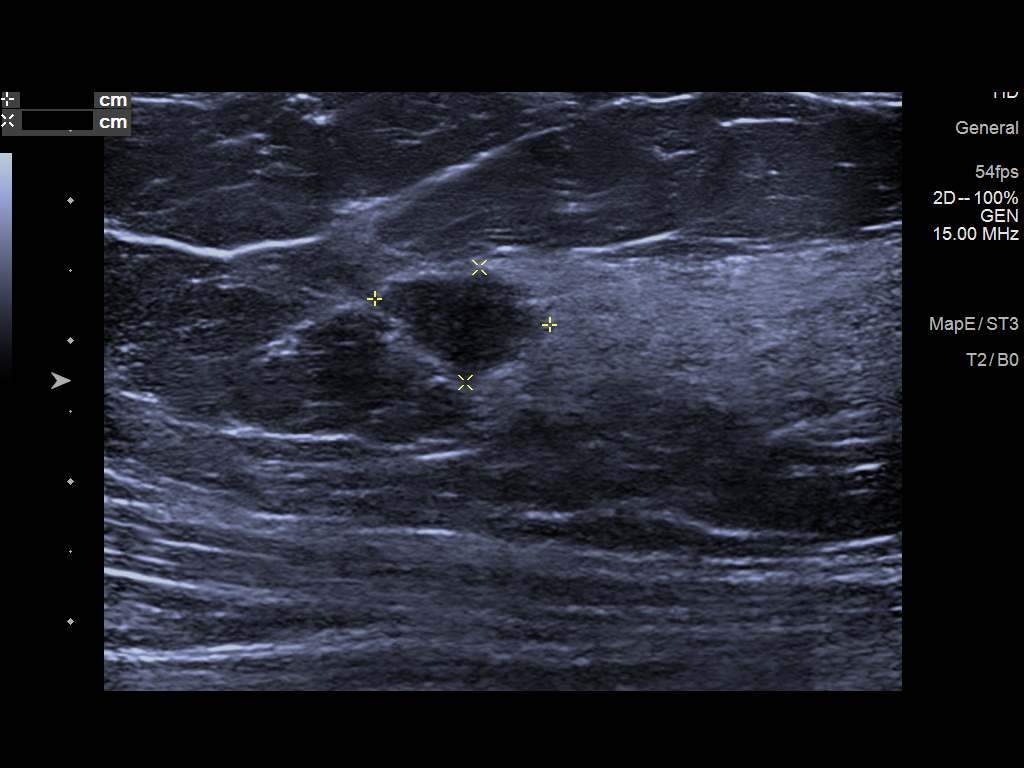
[im 3/6]
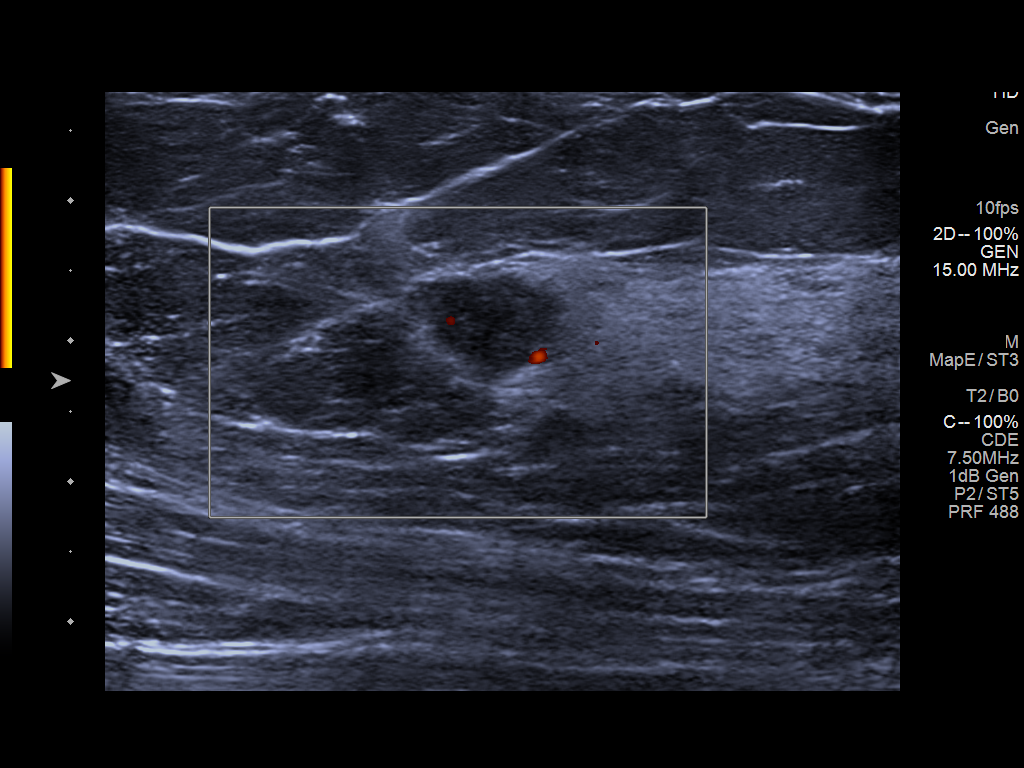
[im 4/6]
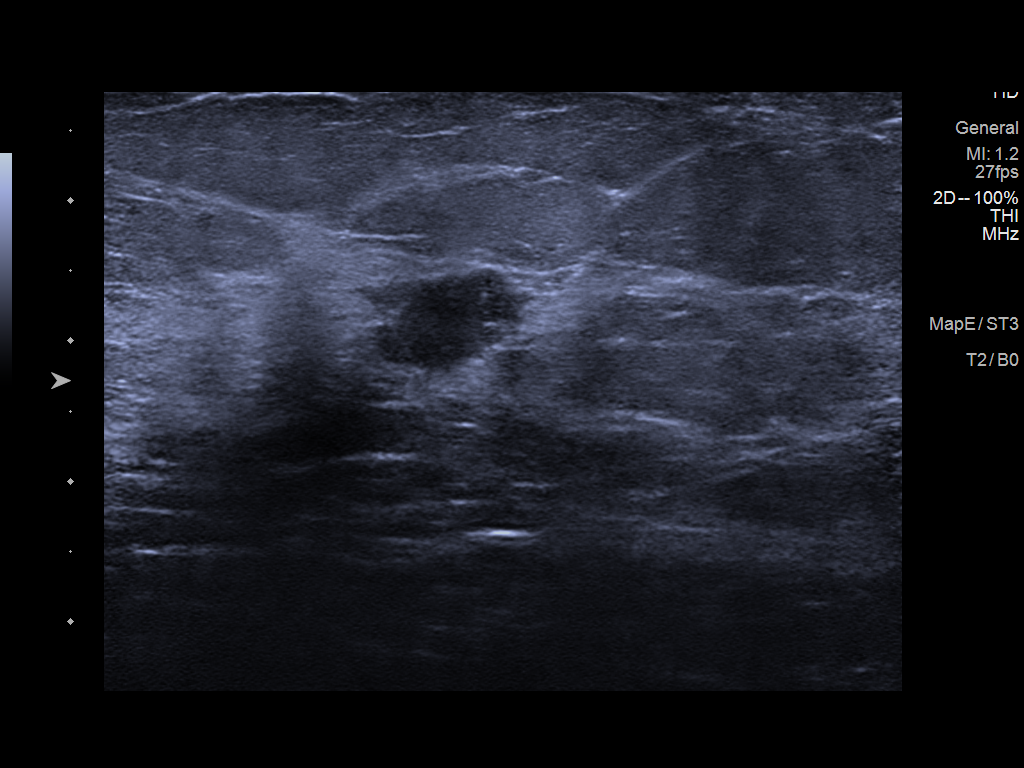
[im 5/6]
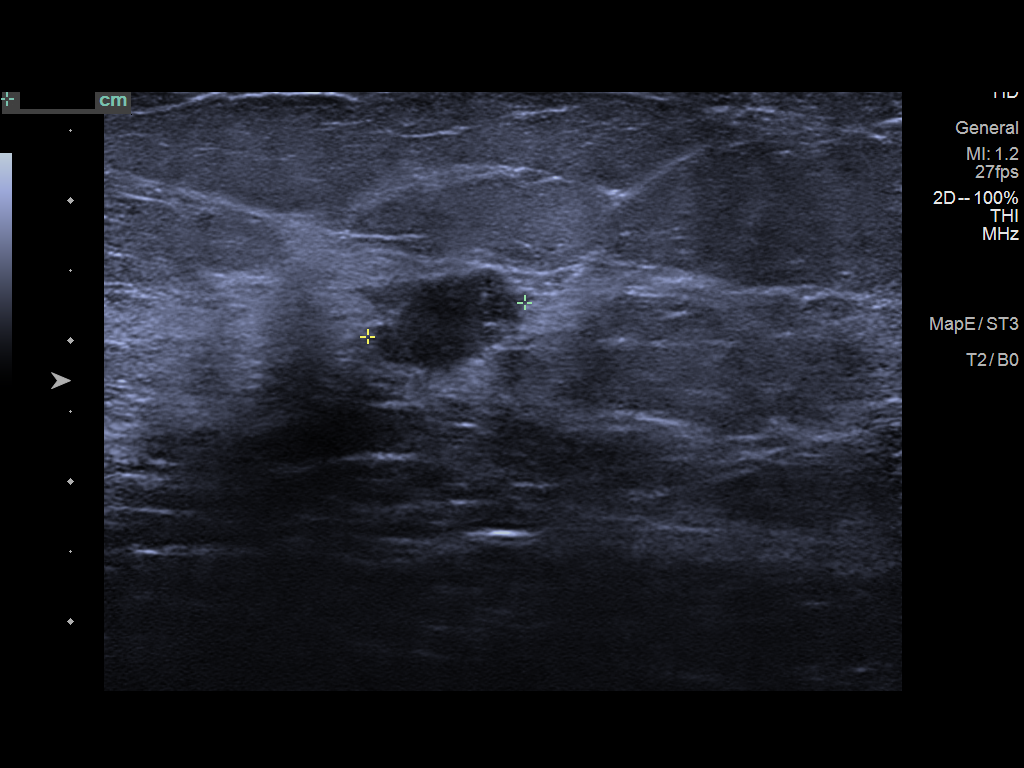
[im 6/6]
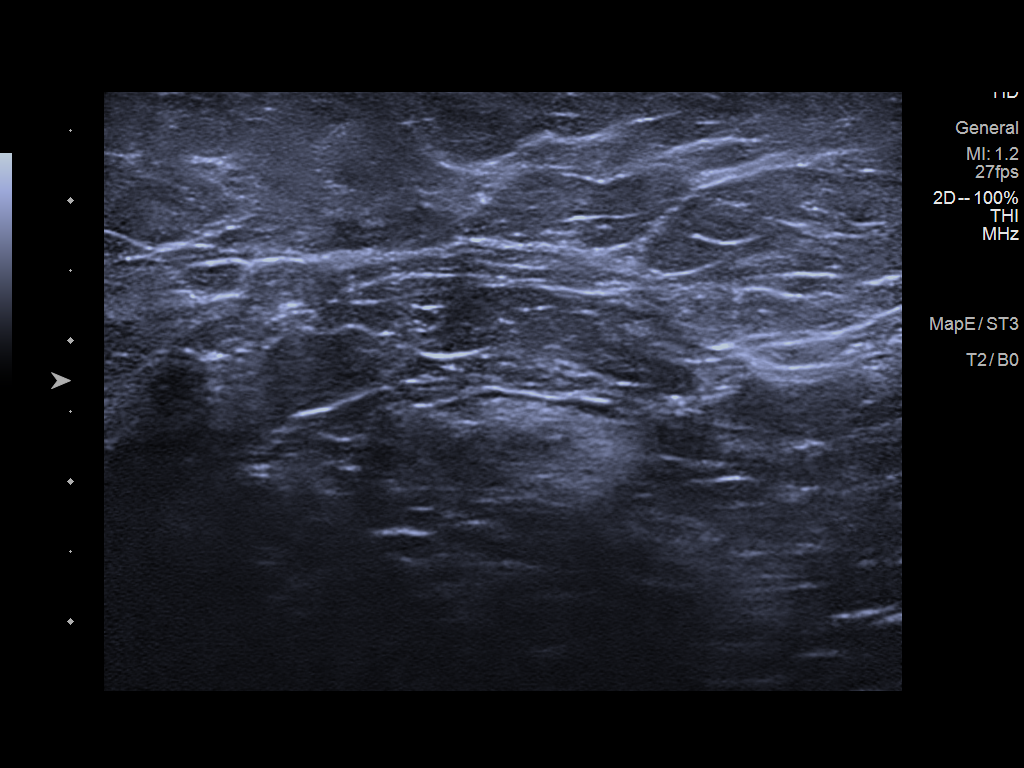

[6 of 6 positions shown; findings below may reference images not displayed]

ACR Breast Density Category c: The breast tissue is heterogeneously
dense, which may obscure small masses.
FINDINGS: Additional tomograms were performed of the bilateral breasts. There
is a mass with irregular margins in the upper-outer right breast
measuring approximately 1.1 cm. There is an oval circumscribed mass
in the upper-outer left breast measuring 0.5 cm.

Mammographic images were processed with CAD.

Physical examination of the upper-outer right breast does not reveal
any definite palpable masses.

Targeted ultrasound of the right breast was performed. There is an
irregular hypoechoic mass at 11 o'clock 4 cm from nipple measuring
1.3 x 0.8 x 1.1 cm. This corresponds well with the mass seen in the
right breast at mammography. No lymphadenopathy seen in the right
axilla.

Targeted ultrasound of the left breast was performed. There is a
cluster of microcysts at 2 o'clock 6 cm from nipple measuring 0.7 x
0.2 x 0.7 cm. This corresponds well with the mass seen in the outer
left breast at mammography.
IMPRESSION: Suspicious mass in the right breast at the 11 o'clock position.

RECOMMENDATION:
Ultrasound-guided biopsy of the mass in the right breast at the 11
o'clock position is recommended. This will be scheduled for the
patient.

I have discussed the findings and recommendations with the patient.
If applicable, a reminder letter will be sent to the patient
regarding the next appointment.

BI-RADS CATEGORY  5: Highly suggestive of malignancy.

## 2021-07-20 IMAGING — US US BREAST*L* LIMITED INC AXILLA
1 series · 6 of 6 positions shown · non-contrast
Comparison: Previous exam(s).

CLINICAL DATA: Screening recall for possible bilateral breast
masses.

EXAM:
DIGITAL DIAGNOSTIC BILATERAL MAMMOGRAM WITH CAD AND TOMO
BILATERAL BREAST ULTRASOUND

[Series 1: us breast*left* limited inc axilla · 0.06mm/px · 6 of 6 slices shown]
[im 1/6]
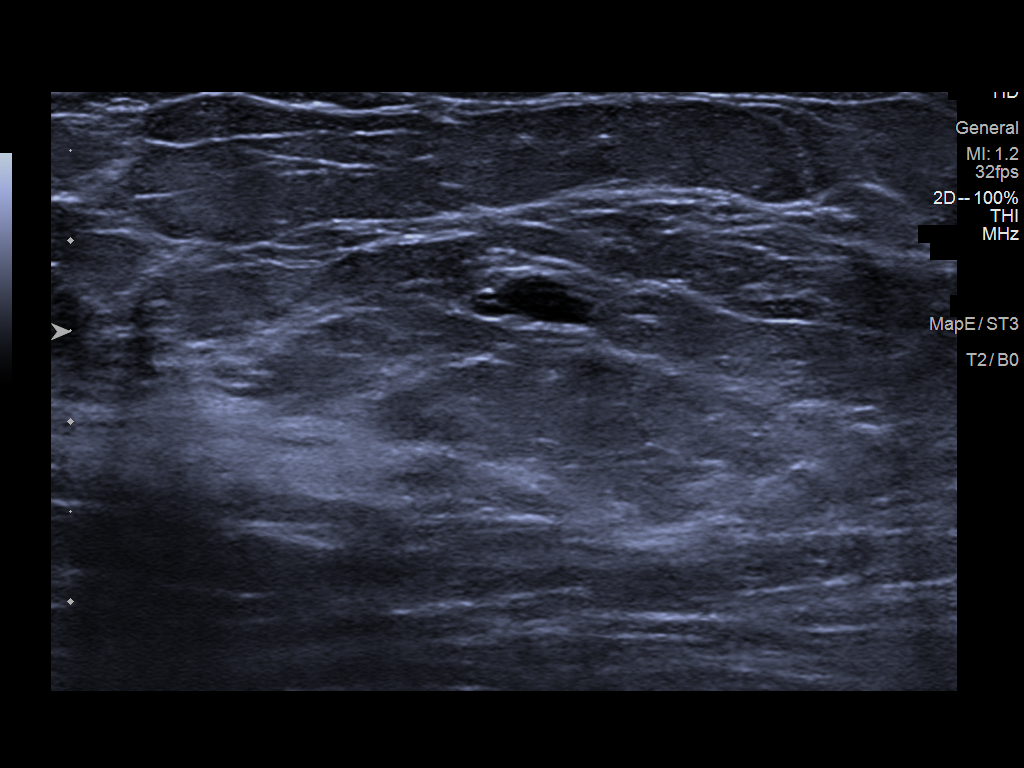
[im 2/6]
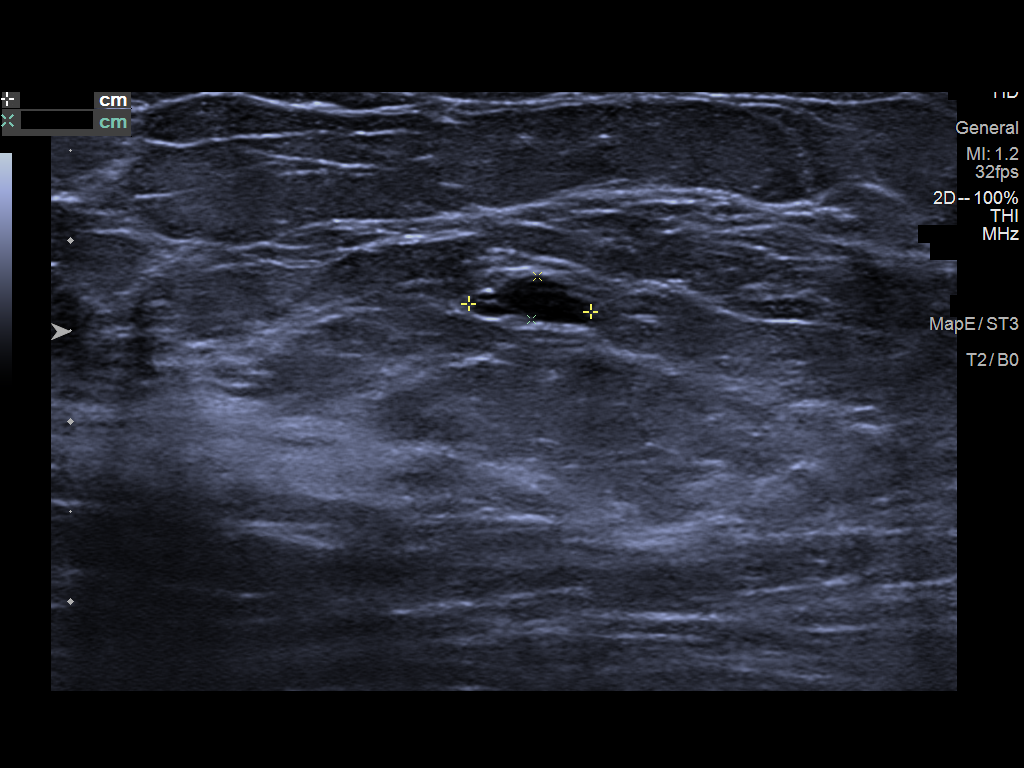
[im 3/6]
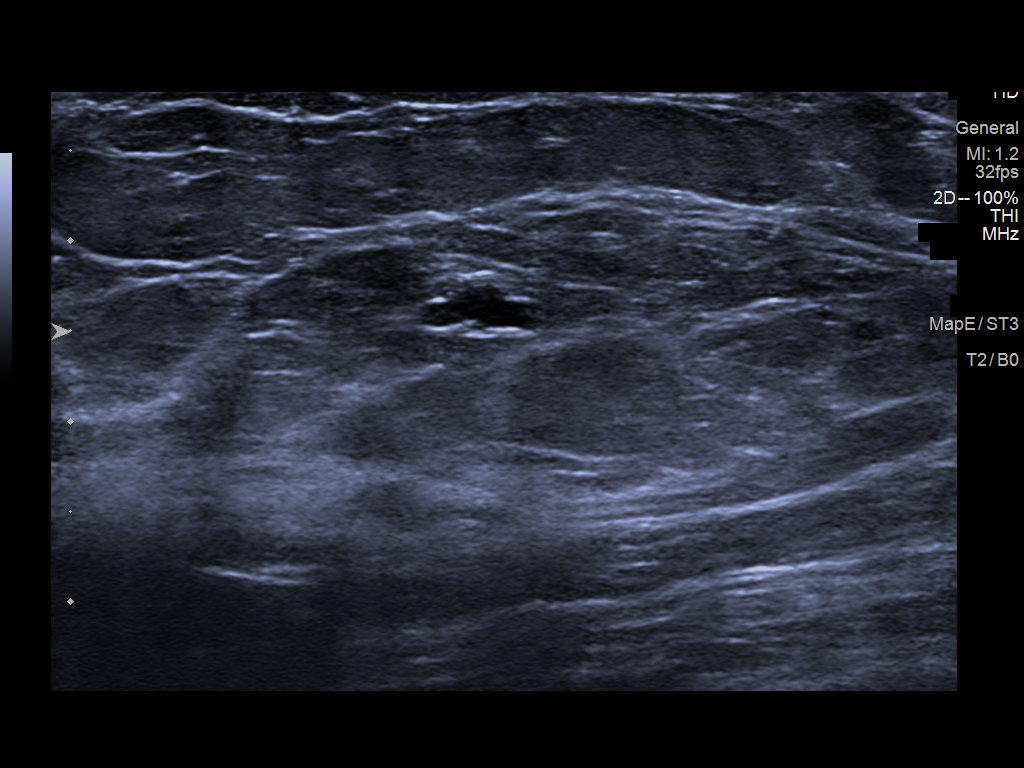
[im 4/6]
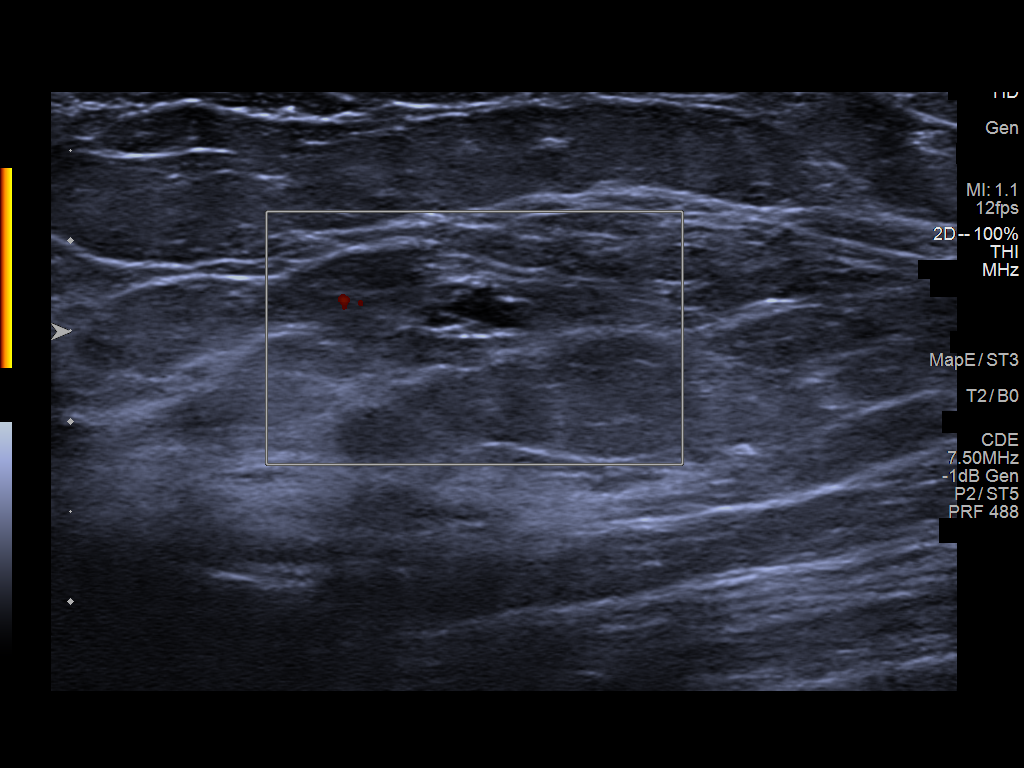
[im 5/6]
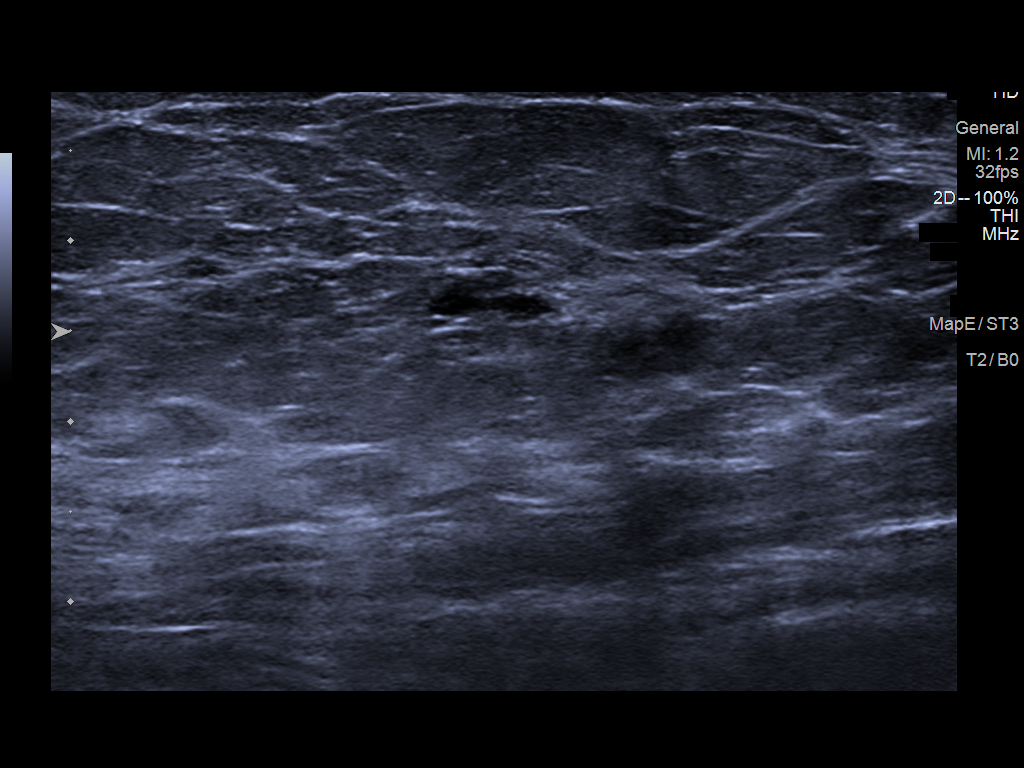
[im 6/6]
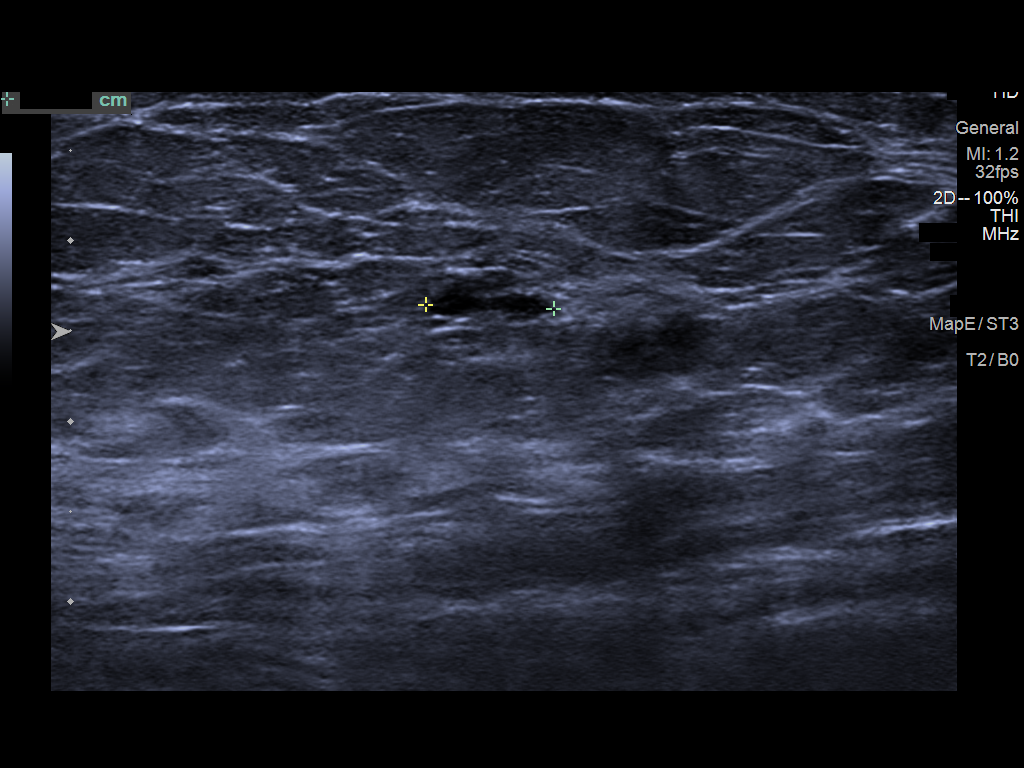

[6 of 6 positions shown; findings below may reference images not displayed]

ACR Breast Density Category c: The breast tissue is heterogeneously
dense, which may obscure small masses.
FINDINGS: Additional tomograms were performed of the bilateral breasts. There
is a mass with irregular margins in the upper-outer right breast
measuring approximately 1.1 cm. There is an oval circumscribed mass
in the upper-outer left breast measuring 0.5 cm.

Mammographic images were processed with CAD.

Physical examination of the upper-outer right breast does not reveal
any definite palpable masses.

Targeted ultrasound of the right breast was performed. There is an
irregular hypoechoic mass at 11 o'clock 4 cm from nipple measuring
1.3 x 0.8 x 1.1 cm. This corresponds well with the mass seen in the
right breast at mammography. No lymphadenopathy seen in the right
axilla.

Targeted ultrasound of the left breast was performed. There is a
cluster of microcysts at 2 o'clock 6 cm from nipple measuring 0.7 x
0.2 x 0.7 cm. This corresponds well with the mass seen in the outer
left breast at mammography.
IMPRESSION: Suspicious mass in the right breast at the 11 o'clock position.

RECOMMENDATION:
Ultrasound-guided biopsy of the mass in the right breast at the 11
o'clock position is recommended. This will be scheduled for the
patient.

I have discussed the findings and recommendations with the patient.
If applicable, a reminder letter will be sent to the patient
regarding the next appointment.

BI-RADS CATEGORY  5: Highly suggestive of malignancy.

## 2021-10-15 ENCOUNTER — Ambulatory Visit: Payer: BLUE CROSS/BLUE SHIELD | Admitting: Oncology

## 2021-10-26 ENCOUNTER — Other Ambulatory Visit: Payer: Self-pay | Admitting: Family Medicine

## 2021-10-26 DIAGNOSIS — E781 Pure hyperglyceridemia: Secondary | ICD-10-CM

## 2021-10-26 DIAGNOSIS — E1169 Type 2 diabetes mellitus with other specified complication: Secondary | ICD-10-CM

## 2021-10-27 ENCOUNTER — Other Ambulatory Visit: Payer: Self-pay

## 2021-10-27 DIAGNOSIS — E1169 Type 2 diabetes mellitus with other specified complication: Secondary | ICD-10-CM

## 2021-10-27 DIAGNOSIS — E781 Pure hyperglyceridemia: Secondary | ICD-10-CM

## 2021-11-04 NOTE — Progress Notes (Signed)
Name: Terri Tanner   MRN: [id number]    DOB: Aug 18, 1960   Date:11/05/2021 ? ?     Progress Note ? ?Subjective ? ?Chief Complaint ? ?Follow up  ? ?HPI ? ?  ?Breast Cancer right side: she has mastectomy done  ( Dr. Fleet Contras ), Nov 13 th, 2020 she did not have chemo or radiation therapy, she was seeing integrative medicine practice called [facility] in [facility] and is still taking Estrogen control supplements ( Dr. Janese Banks is aware) . Since she is not willing to take estrogen suppressive therapy she decided to stop going for regular follow ups with oncologist. She is having left mammogram done yearly  ? ?Osteopenia: last bone density was done 05/2019 and Frax score was low, recheck in 2025 She is taking vitamin D and high calcium diet  ? ?Diabetes with dyslipidemia: she never took medication, initial diagnosis was in 2019 A1C was 6.8 %   but currently is down to 6.3 % on lifestyle modification only. She follows a low sugar diet. She denies polyphagia, polydipsia or polyuria. She has dyslipidemia, she has been taking Pravastatin 40  and Lovaza 4 g daily as prescribed,last triglycerides and LDL improved with current regiment  ?  ?Vitamin D deficiency: she is taking supplementation daily , continue supplementation  ?  ?GERD: doing well on digestive enzymes that she takes for one week a month, no side effects, no heart burn or indigestion Unchanged ? ?Patient Active Problem List  ? Diagnosis Date Noted  ? Abnormal liver function tests 12/16/2019  ? Osteopenia of lumbar spine 08/22/2019  ? Malignant neoplasm of upper-outer quadrant of right breast in female, estrogen receptor positive ([place]) 04/13/2019  ? Rectal polyp   ? Dyslipidemia associated with type 2 diabetes mellitus ([place]) 02/06/2018  ? GERD (gastroesophageal reflux disease) 02/04/2018  ? Vitamin D deficiency 02/04/2018  ? Chronic cough 02/04/2018  ? ? ?Past Surgical History:  ?Procedure Laterality Date  ? BREAST BIOPSY Right 04/07/2019  ? [place]  ? CERVICAL  POLYPECTOMY    ? CESAREAN SECTION    ? COLONOSCOPY WITH PROPOFOL N/A 03/03/2018  ? Procedure: COLONOSCOPY WITH PROPOFOL;  Surgeon: Jonathon Bellows, MD;  Location: [facility] ENDOSCOPY;  Service: Gastroenterology;  Laterality: N/A;  ? MASTECTOMY Right 05/04/2020  ? [facility], clear lymph nodes  ? SENTINEL NODE BIOPSY Right 05/05/2019  ? Procedure: SENTINEL NODE BIOPSY;  Surgeon: Robert Bellow, MD;  Location: [facility] ORS;  Service: General;  Laterality: Right;  ? SIMPLE MASTECTOMY WITH AXILLARY SENTINEL NODE BIOPSY Right 05/05/2019  ? Procedure: SIMPLE MASTECTOMY;  Surgeon: Robert Bellow, MD;  Location: [facility] ORS;  Service: General;  Laterality: Right;  ? TRANSANAL EXCISION OF RECTAL MASS N/A 04/14/2018  ? Procedure: TRANSANAL EXCISION OF RECTAL POLYP;  Surgeon: Jules Husbands, MD;  Location: [facility] ORS;  Service: General;  Laterality: N/A;  ? WISDOM TOOTH EXTRACTION    ? WISDOM TOOTH EXTRACTION    ? ? ?Family History  ?Problem Relation Age of Onset  ? Lung cancer Mother   ?     smoked for many years  ? COPD Mother   ? Emphysema Mother   ? Esophageal cancer Father   ?     smoker  ? Suicidality Father   ? Cancer Sister   ? Stroke Brother   ? Alcohol abuse Brother   ? Other Brother   ?     huge smoker  ? Diabetes Sister   ? Breast cancer Neg Hx   ? ? ?  Social History  ? ?Tobacco Use  ? Smoking status: Never  ? Smokeless tobacco: Never  ?Substance Use Topics  ? Alcohol use: Not Currently  ?  Comment: rarely  ? ? ? ?Current Outpatient Medications:  ?  Ascorbic Acid (VITAMIN C) 1000 MG tablet, Take 1,000 mg by mouth daily. 4000 mg/day, Disp: , Rfl:  ?  Cholecalciferol (VITAMIN D3 PO), Take 5,000 Units/day by mouth daily at 12 noon., Disp: , Rfl:  ?  Digestive Enzymes (BETAINE HCL PO), Take 1 tablet by mouth daily. One week per month , Disp: , Rfl:  ?  MAGNESIUM MALATE PO, Take 202.5 g by mouth daily. nightly , Disp: , Rfl:  ?  MELATONIN PO, Take 40 mg by mouth. nightly , Disp: , Rfl:  ?  Multiple Vitamin (MULTIVITAMIN) tablet, Take 1  tablet by mouth daily., Disp: , Rfl:  ?  NON FORMULARY, Take 500 mg by mouth daily at 12 noon. Liposomal glutathione '250mg'$ /day , Disp: , Rfl:  ?  OVER THE COUNTER MEDICATION, ESTROGEN CONTROL, Disp: , Rfl:  ?  omega-3 acid ethyl esters (LOVAZA) 1 g capsule, Take 2 capsules (2 g total) by mouth 2 (two) times daily., Disp: 360 capsule, Rfl: 1 ?  pravastatin (PRAVACHOL) 40 MG tablet, Take 1 tablet (40 mg total) by mouth daily., Disp: 90 tablet, Rfl: 1 ? ?Allergies  ?Allergen Reactions  ? Atorvastatin Hives and Swelling  ? ? ?I personally reviewed active problem list, medication list, allergies, family history, social history with the patient/caregiver today. ? ? ?ROS ? ?Constitutional: Negative for fever or weight change.  ?Respiratory: Negative for cough and shortness of breath.   ?Cardiovascular: Negative for chest pain or palpitations.  ?Gastrointestinal: Negative for abdominal pain, no bowel changes.  ?Musculoskeletal: Negative for gait problem or joint swelling.  ?Skin: Negative for rash.  ?Neurological: Negative for dizziness or headache.  ?No other specific complaints in a complete review of systems (except as listed in HPI above).  ? ?Objective ? ?Vitals:  ? 11/05/21 0947  ?BP: 122/70  ?Pulse: 90  ?Resp: 14  ?Temp: 98.3 ?F (36.8 ?C)  ?TempSrc: Oral  ?SpO2: 96%  ?Weight: 161 lb 8 oz (73.3 kg)  ?Height: '5\' 3"'$  (1.6 m)  ? ? ?Body mass index is 28.61 kg/m?. ? ?Physical Exam ? ?Constitutional: Patient appears well-developed and well-nourished.   No distress.  ?HEENT: head atraumatic, normocephalic, pupils equal and reactive to light,, neck supple ?Cardiovascular: Normal rate, regular rhythm and normal heart sounds.  No murmur heard. No BLE edema. ?Pulmonary/Chest: Effort normal and breath sounds normal. No respiratory distress. ?Abdominal: Soft.  There is no tenderness. ?Psychiatric: Patient has a normal mood and affect. behavior is normal. Judgment and thought content normal.  ? ?Recent Results (from the past 2160  hour(s))  ?POCT HgB A1C     Status: Abnormal  ? Collection Time: 11/05/21  9:51 AM  ?Result Value Ref Range  ? Hemoglobin A1C 6.3 (A) 4.0 - 5.6 %  ? HbA1c POC (<> result, manual entry)    ? HbA1c, POC (prediabetic range)    ? HbA1c, POC (controlled diabetic range)    ? ? ?Diabetic Foot Exam: ?Diabetic Foot Exam - Simple   ?Simple Foot Form ?Visual Inspection ?No deformities, no ulcerations, no other skin breakdown bilaterally: Yes ?Sensation Testing ?Intact to touch and monofilament testing bilaterally: Yes ?Pulse Check ?Posterior Tibialis and Dorsalis pulse intact bilaterally: Yes ?Comments ?  ? ? ? ?PHQ2/9: ? ?  11/05/2021  ?  9:49  AM 05/08/2021  ?  9:10 AM 11/04/2020  ?  7:59 AM 05/06/2020  ?  8:14 AM 05/06/2020  ?  8:07 AM  ?Depression screen PHQ 2/9  ?Decreased Interest 0 0 0 0 0  ?Down, Depressed, Hopeless 0 0 0 0 0  ?PHQ - 2 Score 0 0 0 0 0  ?Altered sleeping 0 0  0   ?Tired, decreased energy 0 0  0   ?Change in appetite 0 0  0   ?Feeling bad or failure about yourself  0 0  0   ?Trouble concentrating 0 0  0   ?Moving slowly or fidgety/restless 0 0  0   ?Suicidal thoughts 0 0  0   ?PHQ-9 Score 0 0  0   ?Difficult doing work/chores    Not difficult at all   ?  ?phq 9 is negative ? ? ?Fall Risk: ? ?  11/05/2021  ?  9:36 AM 05/08/2021  ?  9:10 AM 11/04/2020  ?  7:58 AM 05/06/2020  ?  8:07 AM 11/01/2019  ?  8:01 AM  ?Fall Risk   ?Falls in the past year? 0 1 1 0 0  ?Number falls in past yr:  0 0 0 0  ?Injury with Fall?  0 0 0 0  ?Risk for fall due to : No Fall Risks No Fall Risks     ?Follow up Falls prevention discussed Falls prevention discussed     ? ? ? ?Functional Status Survey: ?Is the patient deaf or have difficulty hearing?: No ?Does the patient have difficulty seeing, even when wearing glasses/contacts?: No ?Does the patient have difficulty concentrating, remembering, or making decisions?: No ?Does the patient have difficulty walking or climbing stairs?: No ?Does the patient have difficulty dressing or  bathing?: No ?Does the patient have difficulty doing errands alone such as visiting a doctor's office or shopping?: No ? ? ? ?Assessment & Plan ? ?Problem List Items Addressed This Visit   ? ? Vitamin D deficiency  ?  On

## 2021-11-05 ENCOUNTER — Ambulatory Visit: Payer: BLUE CROSS/BLUE SHIELD | Admitting: Family Medicine

## 2021-11-05 ENCOUNTER — Encounter: Payer: Self-pay | Admitting: Family Medicine

## 2021-11-05 VITALS — BP 122/70 | HR 90 | Temp 98.3°F | Resp 14 | Ht 63.0 in | Wt 161.5 lb

## 2021-11-05 DIAGNOSIS — E1169 Type 2 diabetes mellitus with other specified complication: Secondary | ICD-10-CM

## 2021-11-05 DIAGNOSIS — E781 Pure hyperglyceridemia: Secondary | ICD-10-CM

## 2021-11-05 DIAGNOSIS — K219 Gastro-esophageal reflux disease without esophagitis: Secondary | ICD-10-CM

## 2021-11-05 DIAGNOSIS — Z853 Personal history of malignant neoplasm of breast: Secondary | ICD-10-CM

## 2021-11-05 DIAGNOSIS — E785 Hyperlipidemia, unspecified: Secondary | ICD-10-CM

## 2021-11-05 DIAGNOSIS — M8588 Other specified disorders of bone density and structure, other site: Secondary | ICD-10-CM

## 2021-11-05 DIAGNOSIS — E559 Vitamin D deficiency, unspecified: Secondary | ICD-10-CM

## 2021-11-05 DIAGNOSIS — R053 Chronic cough: Secondary | ICD-10-CM

## 2021-11-05 DIAGNOSIS — Z23 Encounter for immunization: Secondary | ICD-10-CM

## 2021-11-05 LAB — POCT GLYCOSYLATED HEMOGLOBIN (HGB A1C): Hemoglobin A1C: 6.3 % — AB (ref 4.0–5.6)

## 2021-11-05 MED ORDER — OMEGA-3-ACID ETHYL ESTERS 1 G PO CAPS: 2.0000 | ORAL_CAPSULE | Freq: Two times a day (BID) | ORAL | 1 refills | Status: DC

## 2021-11-05 MED ORDER — PRAVASTATIN SODIUM 40 MG PO TABS: 40.0000 mg | ORAL_TABLET | Freq: Every day | ORAL | 1 refills | Status: DC

## 2021-11-05 NOTE — Assessment & Plan Note (Signed)
On statin therapy 

## 2021-11-05 NOTE — Assessment & Plan Note (Signed)
When she is nervous only - seems to be a nervous tic ?

## 2021-11-05 NOTE — Assessment & Plan Note (Signed)
On supplementation 

## 2021-11-05 NOTE — Assessment & Plan Note (Signed)
Recheck in 2025 ?

## 2022-02-26 ENCOUNTER — Other Ambulatory Visit: Payer: Self-pay | Admitting: Family Medicine

## 2022-02-26 DIAGNOSIS — Z1231 Encounter for screening mammogram for malignant neoplasm of breast: Secondary | ICD-10-CM

## 2022-03-19 ENCOUNTER — Telehealth: Payer: Self-pay | Admitting: *Deleted

## 2022-03-19 NOTE — Telephone Encounter (Signed)
error 

## 2022-04-08 NOTE — Telephone Encounter (Signed)
Signing encounter, see previous note 12/11/19

## 2022-04-09 ENCOUNTER — Ambulatory Visit
Admission: RE | Admit: 2022-04-09 | Discharge: 2022-04-09 | Disposition: A | Discharge status: Home / Self Care | Payer: 59 | Source: Ambulatory Visit | Attending: Family Medicine | Admitting: Family Medicine

## 2022-04-09 DIAGNOSIS — Z1231 Encounter for screening mammogram for malignant neoplasm of breast: Secondary | ICD-10-CM | POA: Diagnosis not present

## 2022-04-16 DIAGNOSIS — Z853 Personal history of malignant neoplasm of breast: Secondary | ICD-10-CM | POA: Diagnosis not present

## 2022-05-01 ENCOUNTER — Other Ambulatory Visit: Payer: Self-pay | Admitting: Family Medicine

## 2022-05-01 DIAGNOSIS — E1169 Type 2 diabetes mellitus with other specified complication: Secondary | ICD-10-CM

## 2022-05-01 DIAGNOSIS — E781 Pure hyperglyceridemia: Secondary | ICD-10-CM

## 2022-05-08 NOTE — Patient Instructions (Signed)
Preventive Care 40-62 Years Old, Female Preventive care refers to lifestyle choices and visits with your health care provider that can promote health and wellness. Preventive care visits are also called wellness exams. What can I expect for my preventive care visit? Counseling Your health care provider may ask you questions about your: Medical history, including: Past medical problems. Family medical history. Pregnancy history. Current health, including: Menstrual cycle. Method of birth control. Emotional well-being. Home life and relationship well-being. Sexual activity and sexual health. Lifestyle, including: Alcohol, nicotine or tobacco, and drug use. Access to firearms. Diet, exercise, and sleep habits. Work and work environment. Sunscreen use. Safety issues such as seatbelt and bike helmet use. Physical exam Your health care provider will check your: Height and weight. These may be used to calculate your BMI (body mass index). BMI is a measurement that tells if you are at a healthy weight. Waist circumference. This measures the distance around your waistline. This measurement also tells if you are at a healthy weight and may help predict your risk of certain diseases, such as type 2 diabetes and high blood pressure. Heart rate and blood pressure. Body temperature. Skin for abnormal spots. What immunizations do I need?  Vaccines are usually given at various ages, according to a schedule. Your health care provider will recommend vaccines for you based on your age, medical history, and lifestyle or other factors, such as travel or where you work. What tests do I need? Screening Your health care provider may recommend screening tests for certain conditions. This may include: Lipid and cholesterol levels. Diabetes screening. This is done by checking your blood sugar (glucose) after you have not eaten for a while (fasting). Pelvic exam and Pap test. Hepatitis B test. Hepatitis C  test. HIV (human immunodeficiency virus) test. STI (sexually transmitted infection) testing, if you are at risk. Lung cancer screening. Colorectal cancer screening. Mammogram. Talk with your health care provider about when you should start having regular mammograms. This may depend on whether you have a family history of breast cancer. BRCA-related cancer screening. This may be done if you have a family history of breast, ovarian, tubal, or peritoneal cancers. Bone density scan. This is done to screen for osteoporosis. Talk with your health care provider about your test results, treatment options, and if necessary, the need for more tests. Follow these instructions at home: Eating and drinking  Eat a diet that includes fresh fruits and vegetables, whole grains, lean protein, and low-fat dairy products. Take vitamin and mineral supplements as recommended by your health care provider. Do not drink alcohol if: Your health care provider tells you not to drink. You are pregnant, may be pregnant, or are planning to become pregnant. If you drink alcohol: Limit how much you have to 0-1 drink a day. Know how much alcohol is in your drink. In the [place] one drink equals one 12 oz bottle of beer (355 mL), one 5 oz glass of wine (148 mL), or one 1 oz glass of hard liquor (44 mL). Lifestyle Brush your teeth every morning and night with fluoride toothpaste. Floss one time each day. Exercise for at least 30 minutes 5 or more days each week. Do not use any products that contain nicotine or tobacco. These products include cigarettes, chewing tobacco, and vaping devices, such as e-cigarettes. If you need help quitting, ask your health care provider. Do not use drugs. If you are sexually active, practice safe sex. Use a condom or other form of protection to   prevent STIs. If you do not wish to become pregnant, use a form of birth control. If you plan to become pregnant, see your health care provider for a  prepregnancy visit. Take aspirin only as told by your health care provider. Make sure that you understand how much to take and what form to take. Work with your health care provider to find out whether it is safe and beneficial for you to take aspirin daily. Find healthy ways to manage stress, such as: Meditation, yoga, or listening to music. Journaling. Talking to a trusted person. Spending time with friends and family. Minimize exposure to UV radiation to reduce your risk of skin cancer. Safety Always wear your seat belt while driving or riding in a vehicle. Do not drive: If you have been drinking alcohol. Do not ride with someone who has been drinking. When you are tired or distracted. While texting. If you have been using any mind-altering substances or drugs. Wear a helmet and other protective equipment during sports activities. If you have firearms in your house, make sure you follow all gun safety procedures. Seek help if you have been physically or sexually abused. What's next? Visit your health care provider once a year for an annual wellness visit. Ask your health care provider how often you should have your eyes and teeth checked. Stay up to date on all vaccines. This information is not intended to replace advice given to you by your health care provider. Make sure you discuss any questions you have with your health care provider. Document Revised: 12/04/2020 Document Reviewed: 12/04/2020 Elsevier Patient Education  [place].

## 2022-05-08 NOTE — Progress Notes (Unsigned)
Name: TORI DATTILIO   MRN: [id number]    DOB: 1960/09/20   Date:05/11/2022       Progress Note  Subjective  Chief Complaint  Annual Exam  HPI  Patient presents for annual CPE   Diet: she eats healthy  Exercise: going to the gym  three times a week but needs to increase the time she spends at the gym  Last Eye Exam: she is due for follow up with Dr. Matilde Sprang  Last Dental Exam: up to date   [place] Visit from 05/08/2021 in [facility]  AUDIT-C Score 1      Depression: Phq 9 is  negative    05/11/2022    8:42 AM 11/05/2021    9:49 AM 05/08/2021    9:10 AM 11/04/2020    7:59 AM 05/06/2020    8:14 AM  Depression screen PHQ 2/9  Decreased Interest 0 0 0 0 0  Down, Depressed, Hopeless 0 0 0 0 0  PHQ - 2 Score 0 0 0 0 0  Altered sleeping 0 0 0  0  Tired, decreased energy 0 0 0  0  Change in appetite 0 0 0  0  Feeling bad or failure about yourself  0 0 0  0  Trouble concentrating 0 0 0  0  Moving slowly or fidgety/restless 0 0 0  0  Suicidal thoughts 0 0 0  0  PHQ-9 Score 0 0 0  0  Difficult doing work/chores     Not difficult at all   Hypertension: BP Readings from Last 3 Encounters:  05/11/22 126/70  11/05/21 122/70  05/08/21 126/72   Obesity: Wt Readings from Last 3 Encounters:  05/11/22 160 lb (72.6 kg)  11/05/21 161 lb 8 oz (73.3 kg)  05/08/21 158 lb (71.7 kg)   BMI Readings from Last 3 Encounters:  05/11/22 29.26 kg/m  11/05/21 28.61 kg/m  05/08/21 28.90 kg/m     Vaccines:   RSV: discussed vaccine Tdap: refused  Shingrix: refused  Pneumonia: she had PCV 23 , refuses PCV 20 Flu: refused  COVID-19: discussed it with patient    Hep C Screening: 11/27/19 STD testing and prevention (HIV/chl/gon/syphilis): N/A Intimate partner violence: negative screen  Sexual History : not sexually active due to pain, but states husband is okay with it and so is she  Menstrual History/LMP/Abnormal Bleeding: post-menopausal  Discussed  importance of follow up if any post-menopausal bleeding: yes  Incontinence Symptoms: negative for symptoms   Breast cancer:  - Last Mammogram: 04/09/22 - BRCA gene screening: N/A  Osteoporosis Prevention : Discussed high calcium and vitamin D supplementation, weight bearing exercises Bone density: 06/08/19 - repeat at age 52   Cervical cancer screening: 05/08/21  Skin cancer: Discussed monitoring for atypical lesions  Colorectal cancer: 03/03/18   Lung cancer:  Low Dose CT Chest recommended if Age 17-80 years, 20 pack-year currently smoking OR have quit w/in 15years. Patient does not qualify for screen   ECG: 07/14/18  Advanced Care Planning: A voluntary discussion about advance care planning including the explanation and discussion of advance directives.  Discussed health care proxy and Living will, and the patient was able to identify a health care proxy as husband .  Patient does not have a living will and power of attorney of health care   Lipids: Lab Results  Component Value Date   CHOL 219 (H) 05/08/2021   CHOL 205 (H) 11/04/2020   CHOL 264 (H) 05/06/2020  Lab Results  Component Value Date   HDL 52 05/08/2021   HDL 51 11/04/2020   HDL 49 (L) 05/06/2020   Lab Results  Component Value Date   LDLCALC 123 (H) 05/08/2021   LDLCALC 120 (H) 11/04/2020   [place]  05/06/2020     Comment:     . LDL cholesterol not calculated. Triglyceride levels greater than 400 mg/dL invalidate calculated LDL results. . Reference range: <100 . Desirable range <100 mg/dL for primary prevention;   <70 mg/dL for patients with CHD or diabetic patients  with > or = 2 CHD risk factors. Marland Kitchen LDL-C is now calculated using the Martin-Hopkins  calculation, which is a validated novel method providing  better accuracy than the Friedewald equation in the  estimation of LDL-C.  Cresenciano Genre et al. Annamaria Helling. 6803;212(24): 2061-2068  (http://education.[facility].com/faq/FAQ164)    Lab Results   Component Value Date   TRIG 305 (H) 05/08/2021   TRIG 218 (H) 11/04/2020   TRIG 495 (H) 05/06/2020   Lab Results  Component Value Date   CHOLHDL 4.2 05/08/2021   CHOLHDL 4.0 11/04/2020   CHOLHDL 5.4 (H) 05/06/2020   No results found for: "LDLDIRECT"  Glucose: Glucose, Bld  Date Value Ref Range Status  05/08/2021 121 (H) 65 - 99 mg/dL Final    Comment:    .            Fasting reference interval . For someone without known diabetes, a glucose value between 100 and 125 mg/dL is consistent with prediabetes and should be confirmed with a follow-up test. .   12/17/2020 119 (H) 70 - 99 mg/dL Final    Comment:    Glucose reference range applies only to samples taken after fasting for at least 8 hours.  08/05/2020 118 (H) 70 - 99 mg/dL Final    Comment:    Glucose reference range applies only to samples taken after fasting for at least 8 hours.   Glucose-Capillary  Date Value Ref Range Status  05/05/2019 105 (H) 70 - 99 mg/dL Final  04/14/2018 116 (H) 70 - 99 mg/dL Final    Patient Active Problem List   Diagnosis Date Noted   Abnormal liver function tests 12/16/2019   Osteopenia of lumbar spine 08/22/2019   Malignant neoplasm of upper-outer quadrant of right breast in female, estrogen receptor positive ([place]) 04/13/2019   Rectal polyp    Dyslipidemia associated with type 2 diabetes mellitus ([place]) 02/06/2018   GERD (gastroesophageal reflux disease) 02/04/2018   Vitamin D deficiency 02/04/2018   Chronic cough 02/04/2018    Past Surgical History:  Procedure Laterality Date   BREAST BIOPSY Right 04/07/2019   [facility]   CERVICAL POLYPECTOMY     CESAREAN SECTION     COLONOSCOPY WITH PROPOFOL N/A 03/03/2018   Procedure: COLONOSCOPY WITH PROPOFOL;  Surgeon: Jonathon Bellows, MD;  Location: [facility] ENDOSCOPY;  Service: Gastroenterology;  Laterality: N/A;   MASTECTOMY Right 05/04/2020   [facility], clear lymph nodes   SENTINEL NODE BIOPSY Right 05/05/2019   Procedure: SENTINEL NODE BIOPSY;   Surgeon: Robert Bellow, MD;  Location: [facility] ORS;  Service: General;  Laterality: Right;   SIMPLE MASTECTOMY WITH AXILLARY SENTINEL NODE BIOPSY Right 05/05/2019   Procedure: SIMPLE MASTECTOMY;  Surgeon: Robert Bellow, MD;  Location: [facility] ORS;  Service: General;  Laterality: Right;   TRANSANAL EXCISION OF RECTAL MASS N/A 04/14/2018   Procedure: TRANSANAL EXCISION OF RECTAL POLYP;  Surgeon: Jules Husbands, MD;  Location: [facility] ORS;  Service: General;  Laterality: N/A;   WISDOM TOOTH EXTRACTION     WISDOM TOOTH EXTRACTION      Family History  Problem Relation Age of Onset   Lung cancer Mother        smoked for many years   COPD Mother    Emphysema Mother    Esophageal cancer Father        smoker   Suicidality Father    Cancer Sister    Stroke Brother    Alcohol abuse Brother    Other Brother        huge smoker   Diabetes Sister    Breast cancer Neg Hx     Social History   Socioeconomic History   Marital status: Married    Spouse name: Alvester Chou   Number of children: 1   Years of education: Not on file   Highest education level: High school graduate  Occupational History   Occupation: stay at home   Tobacco Use   Smoking status: Never   Smokeless tobacco: Never  Vaping Use   Vaping Use: Never used  Substance and Sexual Activity   Alcohol use: Not Currently    Comment: rarely   Drug use: Never   Sexual activity: Not Currently    Partners: Male    Birth control/protection: None, Surgical  Other Topics Concern   Not on file  Social History Narrative   Patient  moved here from [place], [place] 07/2017   They moved in with their only daughter to home school the grandchildren   Social Determinants of Health   Financial Resource Strain: Low Risk  (05/11/2022)   Overall Financial Resource Strain (CARDIA)    Difficulty of Paying Living Expenses: Not hard at all  Food Insecurity: No Food Insecurity (05/11/2022)   Hunger Vital Sign    Worried About Running Out of  Food in the Last Year: Never true    [place] in the Last Year: Never true  Transportation Needs: No Transportation Needs (05/11/2022)   PRAPARE - Hydrologist (Medical): No    Lack of Transportation (Non-Medical): No  Physical Activity: Inactive (05/11/2022)   Exercise Vital Sign    Days of Exercise per Week: 0 days    Minutes of Exercise per Session: 0 min  Stress: No Stress Concern Present (05/11/2022)   [place]    Feeling of Stress : Not at all  Social Connections: Moderately Integrated (05/08/2021)   Social Connection and Isolation Panel [NHANES]    Frequency of Communication with Friends and Family: Once a week    Frequency of Social Gatherings with Friends and Family: More than three times a week    Attends Religious Services: More than 4 times per year    Active Member of [employer] or Organizations: No    Attends Archivist Meetings: Never    Marital Status: Married  Human resources officer Violence: Not At Risk (05/11/2022)   Humiliation, Afraid, Rape, and Kick questionnaire    Fear of Current or Ex-Partner: No    Emotionally Abused: No    Physically Abused: No    Sexually Abused: No     Current Outpatient Medications:    Ascorbic Acid (VITAMIN C) 1000 MG tablet, Take 1,000 mg by mouth daily. 4000 mg/day, Disp: , Rfl:    Cholecalciferol (VITAMIN D3 PO), Take 5,000 Units/day by mouth daily at 12 noon., Disp: , Rfl:    Digestive Enzymes (BETAINE  HCL PO), Take 1 tablet by mouth daily. One week per month , Disp: , Rfl:    MELATONIN PO, Take 40 mg by mouth. nightly , Disp: , Rfl:    Multiple Vitamin (MULTIVITAMIN) tablet, Take 1 tablet by mouth daily., Disp: , Rfl:    omega-3 acid ethyl esters (LOVAZA) 1 g capsule, TAKE 2 CAPSULES BY MOUTH TWICE A DAY, Disp: 360 capsule, Rfl: 0   OVER THE COUNTER MEDICATION, ESTROGEN CONTROL, Disp: , Rfl:    pravastatin (PRAVACHOL) 40 MG  tablet, Take 1 tablet (40 mg total) by mouth daily., Disp: 90 tablet, Rfl: 1  Allergies  Allergen Reactions   Atorvastatin Hives and Swelling     ROS  Constitutional: Negative for fever or weight change.  Respiratory: Negative for cough and shortness of breath.   Cardiovascular: Negative for chest pain or palpitations.  Gastrointestinal: Negative for abdominal pain, no bowel changes.  Musculoskeletal: Negative for gait problem or joint swelling.  Skin: Negative for rash.  Neurological: Negative for dizziness or headache.  No other specific complaints in a complete review of systems (except as listed in HPI above).   Objective  Vitals:   05/11/22 0842  BP: 126/70  Pulse: 94  Resp: 16  SpO2: 98%  Weight: 160 lb (72.6 kg)  Height: _0  (1.575 m)    Body mass index is 29.26 kg/m.  Physical Exam  Constitutional: Patient appears well-developed and well-nourished. No distress.  HENT: Head: Normocephalic and atraumatic. Ears: B TMs ok, no erythema or effusion; Nose: Nose normal. Mouth/Throat: Oropharynx is clear and moist. No oropharyngeal exudate.  Eyes: Conjunctivae and EOM are normal. Pupils are equal, round, and reactive to light. No scleral icterus.  Neck: Normal range of motion. Neck supple. No JVD present. No thyromegaly present.  Cardiovascular: Normal rate, regular rhythm and normal heart sounds.  No murmur heard. No BLE edema. Pulmonary/Chest: Effort normal and breath sounds normal. No respiratory distress. Abdominal: Soft. Bowel sounds are normal, no distension. There is no tenderness. no masses Breast: no lumps or masses, no nipple discharge or rashes FEMALE GENITALIA:  Not done  RECTAL:not done  Musculoskeletal: Normal range of motion, no joint effusions. No gross deformities Neurological: he is alert and oriented to person, place, and time. No cranial nerve deficit. Coordination, balance, strength, speech and gait are normal.  Skin: Skin is warm and dry. No  rash noted. No erythema.  Psychiatric: Patient has a normal mood and affect. behavior is normal. Judgment and thought content normal.   Fall Risk:    05/11/2022    8:42 AM 11/05/2021    9:36 AM 05/08/2021    9:10 AM 11/04/2020    7:58 AM 05/06/2020    8:07 AM  [place] in the past year? 0 0 1 1 0  Number falls in past yr: 0  0 0 0  Injury with Fall? 0  0 0 0  Risk for fall due to : No Fall Risks No Fall Risks No Fall Risks    Follow up Falls prevention discussed Falls prevention discussed Falls prevention discussed       Functional Status Survey: Is the patient deaf or have difficulty hearing?: No Does the patient have difficulty seeing, even when wearing glasses/contacts?: No Does the patient have difficulty concentrating, remembering, or making decisions?: No Does the patient have difficulty walking or climbing stairs?: No Does the patient have difficulty dressing or bathing?: No Does the patient have difficulty doing errands alone such  as visiting a doctor's office or shopping?: No   Assessment & Plan  1. Well adult exam  - Urine Microalbumin w/creat. ratio - COMPLETE METABOLIC PANEL WITH GFR - Lipid Profile  2. Dyslipidemia associated with type 2 diabetes mellitus (HCC)  - Hemoglobin A1c  3. Vitamin D deficiency   4. Long-term use of high-risk medication  - CBC with Differential/Platelet    -USPSTF grade A and B recommendations reviewed with patient; age-appropriate recommendations, preventive care, screening tests, etc discussed and encouraged; healthy living encouraged; see AVS for patient education given to patient -Discussed importance of 150 minutes of physical activity weekly, eat two servings of fish weekly, eat one serving of tree nuts ( cashews, pistachios, pecans, almonds.Marland Kitchen) every other day, eat 6 servings of fruit/vegetables daily and drink plenty of water and avoid sweet beverages.   -Reviewed Health Maintenance: Yes.

## 2022-05-11 ENCOUNTER — Ambulatory Visit (INDEPENDENT_AMBULATORY_CARE_PROVIDER_SITE_OTHER): Payer: 59 | Admitting: Family Medicine

## 2022-05-11 ENCOUNTER — Encounter: Payer: Self-pay | Admitting: Family Medicine

## 2022-05-11 VITALS — BP 126/70 | HR 94 | Resp 16 | Ht 62.0 in | Wt 160.0 lb

## 2022-05-11 DIAGNOSIS — E559 Vitamin D deficiency, unspecified: Secondary | ICD-10-CM | POA: Diagnosis not present

## 2022-05-11 DIAGNOSIS — E1169 Type 2 diabetes mellitus with other specified complication: Secondary | ICD-10-CM

## 2022-05-11 DIAGNOSIS — Z79899 Other long term (current) drug therapy: Secondary | ICD-10-CM | POA: Diagnosis not present

## 2022-05-11 DIAGNOSIS — Z0000 Encounter for general adult medical examination without abnormal findings: Secondary | ICD-10-CM | POA: Diagnosis not present

## 2022-05-11 DIAGNOSIS — E785 Hyperlipidemia, unspecified: Secondary | ICD-10-CM | POA: Diagnosis not present

## 2022-05-12 LAB — COMPLETE METABOLIC PANEL WITH GFR
AG Ratio: 2 (calc) (ref 1.0–2.5)
ALT: 28 U/L (ref 6–29)
AST: 19 U/L (ref 10–35)
Albumin: 4.7 g/dL (ref 3.6–5.1)
Alkaline phosphatase (APISO): 80 U/L (ref 37–153)
BUN: 10 mg/dL (ref 7–25)
CO2: 26 mmol/L (ref 20–32)
Calcium: 9.9 mg/dL (ref 8.6–10.4)
Chloride: 103 mmol/L (ref 98–110)
Creat: 0.57 mg/dL (ref 0.50–1.05)
Globulin: 2.4 g/dL (calc) (ref 1.9–3.7)
Glucose, Bld: 126 mg/dL — ABNORMAL HIGH (ref 65–99)
Potassium: 4.2 mmol/L (ref 3.5–5.3)
Sodium: 139 mmol/L (ref 135–146)
Total Bilirubin: 0.5 mg/dL (ref 0.2–1.2)
Total Protein: 7.1 g/dL (ref 6.1–8.1)
eGFR: 103 mL/min/{1.73_m2} (ref >=60)

## 2022-05-12 LAB — MICROALBUMIN / CREATININE URINE RATIO
Creatinine, Urine: 44 mg/dL (ref 20–275)
Microalb Creat Ratio: 5 mcg/mg creat (ref <30)
Microalb, Ur: 0.2 mg/dL

## 2022-05-12 LAB — LIPID PANEL
Cholesterol: 219 mg/dL — ABNORMAL HIGH (ref <200)
HDL: 56 mg/dL (ref >=50)
LDL Cholesterol (Calc): 122 mg/dL (calc) — ABNORMAL HIGH
Non-HDL Cholesterol (Calc): 163 mg/dL (calc) — ABNORMAL HIGH (ref <130)
Total CHOL/HDL Ratio: 3.9 (calc) (ref <5.0)
Triglycerides: 265 mg/dL — ABNORMAL HIGH (ref <150)

## 2022-05-12 LAB — CBC WITH DIFFERENTIAL/PLATELET
Absolute Monocytes: 306 cells/uL (ref 200–950)
Basophils Absolute: 51 cells/uL (ref 0–200)
Basophils Relative: 1 %
Eosinophils Absolute: 97 cells/uL (ref 15–500)
Eosinophils Relative: 1.9 %
HCT: 43.5 % (ref 35.0–45.0)
Hemoglobin: 15.2 g/dL (ref 11.7–15.5)
Lymphs Abs: 1877 cells/uL (ref 850–3900)
MCH: 30.8 pg (ref 27.0–33.0)
MCHC: 34.9 g/dL (ref 32.0–36.0)
MCV: 88.2 fL (ref 80.0–100.0)
MPV: 12 fL (ref 7.5–12.5)
Monocytes Relative: 6 %
Neutro Abs: 2769 cells/uL (ref 1500–7800)
Neutrophils Relative %: 54.3 %
Platelets: 224 10*3/uL (ref 140–400)
RBC: 4.93 10*6/uL (ref 3.80–5.10)
RDW: 11.7 % (ref 11.0–15.0)
Total Lymphocyte: 36.8 %
WBC: 5.1 10*3/uL (ref 3.8–10.8)

## 2022-05-12 LAB — HEMOGLOBIN A1C
Hgb A1c MFr Bld: 6.8 % of total Hgb — ABNORMAL HIGH (ref <5.7)
Mean Plasma Glucose: 148 mg/dL
eAG (mmol/L): 8.2 mmol/L

## 2022-05-28 ENCOUNTER — Other Ambulatory Visit: Payer: Self-pay | Admitting: Family Medicine

## 2022-05-28 DIAGNOSIS — E1169 Type 2 diabetes mellitus with other specified complication: Secondary | ICD-10-CM

## 2022-06-19 DIAGNOSIS — Z23 Encounter for immunization: Secondary | ICD-10-CM | POA: Diagnosis not present

## 2022-06-19 DIAGNOSIS — E782 Mixed hyperlipidemia: Secondary | ICD-10-CM | POA: Diagnosis not present

## 2022-11-09 ENCOUNTER — Ambulatory Visit: Payer: 59 | Admitting: Family Medicine

## 2023-02-24 ENCOUNTER — Other Ambulatory Visit: Payer: Self-pay | Admitting: Family

## 2023-02-24 DIAGNOSIS — Z1231 Encounter for screening mammogram for malignant neoplasm of breast: Secondary | ICD-10-CM

## 2023-03-26 DIAGNOSIS — A048 Other specified bacterial intestinal infections: Secondary | ICD-10-CM | POA: Diagnosis not present

## 2023-04-15 ENCOUNTER — Telehealth: Payer: Self-pay | Admitting: Family

## 2023-04-15 ENCOUNTER — Ambulatory Visit: Payer: 59 | Admitting: Family

## 2023-04-15 ENCOUNTER — Encounter: Payer: Self-pay | Admitting: Family

## 2023-04-15 VITALS — BP 118/80 | HR 99 | Temp 97.8°F | Ht 61.61 in | Wt 125.8 lb

## 2023-04-15 DIAGNOSIS — E785 Hyperlipidemia, unspecified: Secondary | ICD-10-CM | POA: Diagnosis not present

## 2023-04-15 DIAGNOSIS — K219 Gastro-esophageal reflux disease without esophagitis: Secondary | ICD-10-CM | POA: Diagnosis not present

## 2023-04-15 DIAGNOSIS — Z7689 Persons encountering health services in other specified circumstances: Secondary | ICD-10-CM

## 2023-04-15 DIAGNOSIS — A048 Other specified bacterial intestinal infections: Secondary | ICD-10-CM | POA: Diagnosis not present

## 2023-04-15 DIAGNOSIS — E1169 Type 2 diabetes mellitus with other specified complication: Secondary | ICD-10-CM

## 2023-04-15 HISTORY — DX: Persons encountering health services in other specified circumstances: Z76.89

## 2023-04-15 NOTE — Patient Instructions (Addendum)
We will need to perform a test of cure after H. pylori triple therapy treatment. This is usually performed 4 weeks after completion of therapy.  Please note that you must be off omeprazole therapy one to two weeks prior to testing.

## 2023-04-15 NOTE — Assessment & Plan Note (Signed)
Symptoms have resolved.  Discussed test of cure in 4 weeks time.  Patient will schedule

## 2023-04-15 NOTE — Assessment & Plan Note (Addendum)
Reviewed past medical , surgical, and family history.  Mammogram is scheduled.

## 2023-04-15 NOTE — Telephone Encounter (Signed)
Call [facility]  Confirm when pt is to have repeat colonoscopy with Dr Tobi Bastos

## 2023-04-15 NOTE — Progress Notes (Signed)
Assessment & Plan:  H. pylori infection Assessment & Plan: Symptoms have resolved.  Discussed test of cure in 4 weeks time.  Patient will schedule  Orders: -     H. pylori breath test; Future  Dyslipidemia associated with type 2 diabetes mellitus (HCC) Assessment & Plan: Lab Results  Component Value Date   HGBA1C 6.8 (H) 05/11/2022   Pending A1c.   Orders: -     Hemoglobin A1c -     Comprehensive metabolic panel -     Lipid panel; Future -     Microalbumin / creatinine urine ratio  Encounter to establish care Assessment & Plan: Reviewed past medical , surgical, and family history.  Mammogram is scheduled.    Orders: -     Hemoglobin A1c -     CBC with Differential/Platelet -     Comprehensive metabolic panel -     Lipid panel; Future -     TSH -     Microalbumin / creatinine urine ratio -     H. pylori breath test; Future     Return precautions given.   Risks, benefits, and alternatives of the medications and treatment plan prescribed today were discussed, and patient expressed understanding.   Education regarding symptom management and diagnosis given to patient on AVS either electronically or printed.  Return in about 1 year (around 04/14/2024) for Complete Physical Exam.  Rennie Plowman, FNP  Subjective:    Patient ID: Terri Tanner, female    DOB: 11-28-1960, 62 y.o.   MRN: [id number]  CC: Terri Tanner is a 62 y.o. female who presents today to establish care.    HPI: Feels well today No new complaints Recently treated at [facility] March 27, 2023 for H. pylori.  She completed omeprazole 40 mg daily, clindamycin 500 mg twice daily, amoxicillin 500 mg 14 day course.  Epigastric pain has resolved.    Consult Dr Cassie Freer, cardiology 06/19/2022 At time of visit she did not change from pravastatin to a more potent statin History of right breast cancer.  Mammogram left breast scheduled  Allergies: Atorvastatin Current Outpatient Medications on  File Prior to Visit  Medication Sig Dispense Refill   OVER THE COUNTER MEDICATION Take 500 mg by mouth daily. ESTROGEN CONTROL     OVER THE COUNTER MEDICATION Take 2,300 mg by mouth daily at 6 (six) AM. Mag-8 Magnesium Complex     No current facility-administered medications on file prior to visit.    Review of Systems  Constitutional:  Negative for chills and fever.  Respiratory:  Negative for cough.   Cardiovascular:  Negative for chest pain and palpitations.  Gastrointestinal:  Negative for nausea and vomiting.      Objective:    BP 118/80   Pulse 99   Temp 97.8 F (36.6 C) (Temporal)   Ht 5' 1.61" (1.565 m)   Wt 125 lb 12.8 oz (57.1 kg)   SpO2 99%   BMI 23.30 kg/m  BP Readings from Last 3 Encounters:  04/15/23 118/80  05/11/22 126/70  11/05/21 122/70   Wt Readings from Last 3 Encounters:  04/15/23 125 lb 12.8 oz (57.1 kg)  05/11/22 160 lb (72.6 kg)  11/05/21 161 lb 8 oz (73.3 kg)    Physical Exam Vitals reviewed.  Constitutional:      Appearance: She is well-developed.  Eyes:     Conjunctiva/sclera: Conjunctivae normal.  Neck:     Thyroid: No thyroid mass or thyromegaly.  Cardiovascular:  Rate and Rhythm: Normal rate and regular rhythm.     Pulses: Normal pulses.     Heart sounds: Normal heart sounds.  Pulmonary:     Effort: Pulmonary effort is normal.     Breath sounds: Normal breath sounds. No wheezing, rhonchi or rales.  Lymphadenopathy:     Head:     Right side of head: No submental, submandibular, tonsillar, preauricular, posterior auricular or occipital adenopathy.     Left side of head: No submental, submandibular, tonsillar, preauricular, posterior auricular or occipital adenopathy.     Cervical: No cervical adenopathy.  Skin:    General: Skin is warm and dry.  Neurological:     Mental Status: She is alert.  Psychiatric:        Speech: Speech normal.        Behavior: Behavior normal.        Thought Content: Thought content normal.

## 2023-04-15 NOTE — Assessment & Plan Note (Signed)
Lab Results  Component Value Date   HGBA1C 6.8 (H) 05/11/2022   Pending A1c.

## 2023-04-16 ENCOUNTER — Ambulatory Visit
Admission: RE | Admit: 2023-04-16 | Discharge: 2023-04-16 | Disposition: A | Discharge status: Home / Self Care | Payer: 59 | Source: Ambulatory Visit | Attending: Family | Admitting: Family

## 2023-04-16 DIAGNOSIS — Z1231 Encounter for screening mammogram for malignant neoplasm of breast: Secondary | ICD-10-CM | POA: Diagnosis not present

## 2023-04-16 LAB — COMPREHENSIVE METABOLIC PANEL
ALT: 25 U/L (ref 0–35)
AST: 19 U/L (ref 0–37)
Albumin: 4.4 g/dL (ref 3.5–5.2)
Alkaline Phosphatase: 95 U/L (ref 39–117)
BUN: 13 mg/dL (ref 6–23)
CO2: 27 meq/L (ref 19–32)
Calcium: 9.9 mg/dL (ref 8.4–10.5)
Chloride: 100 meq/L (ref 96–112)
Creatinine, Ser: 0.95 mg/dL (ref 0.40–1.20)
GFR: 63.79 mL/min (ref >60.00)
Glucose, Bld: 159 mg/dL — ABNORMAL HIGH (ref 70–99)
Potassium: 4.2 meq/L (ref 3.5–5.1)
Sodium: 138 meq/L (ref 135–145)
Total Bilirubin: 0.3 mg/dL (ref 0.2–1.2)
Total Protein: 7.2 g/dL (ref 6.0–8.3)

## 2023-04-16 LAB — CBC WITH DIFFERENTIAL/PLATELET
Basophils Absolute: 0.1 10*3/uL (ref 0.0–0.1)
Basophils Relative: 0.7 % (ref 0.0–3.0)
Eosinophils Absolute: 0.1 10*3/uL (ref 0.0–0.7)
Eosinophils Relative: 1.8 % (ref 0.0–5.0)
HCT: 43 % (ref 36.0–46.0)
Hemoglobin: 14.8 g/dL (ref 12.0–15.0)
Lymphocytes Relative: 30.6 % (ref 12.0–46.0)
Lymphs Abs: 2.4 10*3/uL (ref 0.7–4.0)
MCHC: 34.4 g/dL (ref 30.0–36.0)
MCV: 87.7 fL (ref 78.0–100.0)
Monocytes Absolute: 0.7 10*3/uL (ref 0.1–1.0)
Monocytes Relative: 8.7 % (ref 3.0–12.0)
Neutro Abs: 4.5 10*3/uL (ref 1.4–7.7)
Neutrophils Relative %: 58.2 % (ref 43.0–77.0)
Platelets: 284 10*3/uL (ref 150.0–400.0)
RBC: 4.91 Mil/uL (ref 3.87–5.11)
RDW: 12.3 % (ref 11.5–15.5)
WBC: 7.8 10*3/uL (ref 4.0–10.5)

## 2023-04-16 LAB — HEMOGLOBIN A1C: Hgb A1c MFr Bld: 7 % — ABNORMAL HIGH (ref 4.6–6.5)

## 2023-04-16 LAB — TSH: TSH: 2.35 u[IU]/mL (ref 0.35–5.50)

## 2023-04-16 LAB — MICROALBUMIN / CREATININE URINE RATIO
Creatinine,U: 173.4 mg/dL
Microalb Creat Ratio: 0.6 mg/g (ref 0.0–30.0)
Microalb, Ur: 1 mg/dL (ref 0.0–1.9)

## 2023-04-19 ENCOUNTER — Telehealth: Payer: Self-pay

## 2023-04-19 NOTE — Telephone Encounter (Signed)
Terri Tanner called from the patient PCP office to see when the patient next colonoscopy needs to be done.

## 2023-04-19 NOTE — Telephone Encounter (Signed)
PCP would like to know when is patient due for a repeat colonoscopy please advise.

## 2023-04-19 NOTE — Telephone Encounter (Signed)
Dr. Thurston Hole currently out office will give [place] an update on when pt is due. Telephone encounter will be created with both providers

## 2023-04-21 NOTE — Telephone Encounter (Signed)
Mrs. Terri Plowman, FNP, patient is placed in our recall list. Thank you.

## 2023-04-22 ENCOUNTER — Encounter: Payer: Self-pay | Admitting: Family

## 2023-04-22 NOTE — Telephone Encounter (Signed)
Noted. Sent my chart message.

## 2023-04-22 NOTE — Telephone Encounter (Signed)
noted 

## 2023-04-22 NOTE — Telephone Encounter (Signed)
Noted  

## 2023-04-27 ENCOUNTER — Encounter: Payer: Self-pay | Admitting: Family

## 2023-05-12 ENCOUNTER — Telehealth: Payer: Self-pay | Admitting: Family

## 2023-05-12 NOTE — Telephone Encounter (Signed)
Needing a prescription sent to [facility] on [address] for Right Breast prosthesis and Bra's.

## 2023-05-13 ENCOUNTER — Other Ambulatory Visit (INDEPENDENT_AMBULATORY_CARE_PROVIDER_SITE_OTHER): Payer: 59

## 2023-05-13 DIAGNOSIS — A048 Other specified bacterial intestinal infections: Secondary | ICD-10-CM

## 2023-05-13 DIAGNOSIS — E1169 Type 2 diabetes mellitus with other specified complication: Secondary | ICD-10-CM | POA: Diagnosis not present

## 2023-05-13 DIAGNOSIS — Z7689 Persons encountering health services in other specified circumstances: Secondary | ICD-10-CM | POA: Diagnosis not present

## 2023-05-13 DIAGNOSIS — E785 Hyperlipidemia, unspecified: Secondary | ICD-10-CM | POA: Diagnosis not present

## 2023-05-13 LAB — LIPID PANEL
Cholesterol: 287 mg/dL — ABNORMAL HIGH (ref 0–200)
HDL: 42.7 mg/dL (ref >39.00)
LDL Cholesterol: 174 mg/dL — ABNORMAL HIGH (ref 0–99)
NonHDL: 244.22
Total CHOL/HDL Ratio: 7
Triglycerides: 350 mg/dL — ABNORMAL HIGH (ref 0.0–149.0)
VLDL: 70 mg/dL — ABNORMAL HIGH (ref 0.0–40.0)

## 2023-05-17 ENCOUNTER — Encounter: Payer: Self-pay | Admitting: Family

## 2023-05-17 LAB — H. PYLORI BREATH TEST: H. pylori Breath Test: NOT DETECTED

## 2023-05-17 NOTE — Telephone Encounter (Signed)
Faxed on 05/17/23 received confirmation that it was received

## 2023-05-26 ENCOUNTER — Ambulatory Visit: Payer: 59 | Admitting: Family

## 2023-05-26 ENCOUNTER — Encounter: Payer: Self-pay | Admitting: Family

## 2023-05-26 VITALS — BP 130/78 | HR 87 | Temp 97.9°F | Ht 61.0 in | Wt 158.2 lb

## 2023-05-26 DIAGNOSIS — E785 Hyperlipidemia, unspecified: Secondary | ICD-10-CM

## 2023-05-26 DIAGNOSIS — A048 Other specified bacterial intestinal infections: Secondary | ICD-10-CM | POA: Diagnosis not present

## 2023-05-26 DIAGNOSIS — E1169 Type 2 diabetes mellitus with other specified complication: Secondary | ICD-10-CM

## 2023-05-26 NOTE — Assessment & Plan Note (Signed)
Lab Results  Component Value Date   HGBA1C 7.0 (H) 04/15/2023  Uncontrolled.  Discussed goal of A1c 6.5 Discussed metformin 500 mg ,nutrition consult.  Patient politely declines at this time as she is focused on lifestyle modifications and has already lost 2 pounds.  Repeat A1c in 3 months.

## 2023-05-26 NOTE — Assessment & Plan Note (Signed)
Resolved. Test of cure negative 04/2023

## 2023-05-26 NOTE — Progress Notes (Addendum)
   Assessment & Plan:  Dyslipidemia associated with type 2 diabetes mellitus [facility]) Assessment & Plan: Lab Results  Component Value Date   HGBA1C 7.0 (H) 04/15/2023  Uncontrolled.  Discussed goal of A1c 6.5 Discussed metformin 500 mg ,nutrition consult.  Patient politely declines at this time as she is focused on lifestyle modifications and has already lost 2 pounds.  Repeat A1c in 3 months.   H. pylori infection Assessment & Plan: Resolved. Test of cure negative 04/2023      Return precautions given.   Risks, benefits, and alternatives of the medications and treatment plan prescribed today were discussed, and patient expressed understanding.   Education regarding symptom management and diagnosis given to patient on AVS either electronically or printed.  Return in about 3 months (around 08/24/2023).  Bascom Bossier, FNP  Subjective:    Patient ID: Terri Tanner, female    DOB: 05-18-1961, 62 y.o.   MRN: [id number]  CC: Terri Tanner is a 62 y.o. female who presents today for follow up.   HPI: Feels well today Epigastric pain has resolved.  She is exercising 3 days a week.  She has stopped eating bread and pasta since A1c resulted.      History of right breast cancer, s/p right mastectomy in 2018   Allergies: Atorvastatin  Current Outpatient Medications on File Prior to Visit  Medication Sig Dispense Refill   OVER THE COUNTER MEDICATION Take 500 mg by mouth daily. ESTROGEN CONTROL     OVER THE COUNTER MEDICATION Take 2,300 mg by mouth daily at 6 (six) AM. Mag-8 Magnesium Complex     No current facility-administered medications on file prior to visit.    Review of Systems  Constitutional:  Negative for chills and fever.  Respiratory:  Negative for cough.   Cardiovascular:  Negative for chest pain and palpitations.  Gastrointestinal:  Negative for nausea and vomiting.      Objective:    BP 130/78   Pulse 87   Temp 97.9 F (36.6 C) (Oral)   Ht 5\' 1"   (1.549 m)   Wt 158 lb 3.2 oz (71.8 kg)   SpO2 98%   BMI 29.89 kg/m  BP Readings from Last 3 Encounters:  05/26/23 130/78  04/15/23 118/80  05/11/22 126/70   Wt Readings from Last 3 Encounters:  05/26/23 158 lb 3.2 oz (71.8 kg)  04/15/23 125 lb 12.8 oz (57.1 kg)  05/11/22 160 lb (72.6 kg)    Physical Exam Vitals reviewed.  Constitutional:      Appearance: She is well-developed.  Eyes:     Conjunctiva/sclera: Conjunctivae normal.  Cardiovascular:     Rate and Rhythm: Normal rate and regular rhythm.     Pulses: Normal pulses.     Heart sounds: Normal heart sounds.  Pulmonary:     Effort: Pulmonary effort is normal.     Breath sounds: Normal breath sounds. No wheezing, rhonchi or rales.  Skin:    General: Skin is warm and dry.  Neurological:     Mental Status: She is alert.  Psychiatric:        Speech: Speech normal.        Behavior: Behavior normal.        Thought Content: Thought content normal.

## 2023-05-26 NOTE — Patient Instructions (Addendum)
Goal of fasting blood sugar is between 70-120. If in this range, we are reaching our target a1c ( goal 6.5%)   Please check fasting blood sugar in the morning time once or twice a week.  You may also check if you feel like you are having a low episode or particularly high episode of blood sugar.  If blood sugars increase, I may advise you to check blood sugar after your largest meal.  You specifically do this TWO hours after largest meal with the goal of being less than 180.  If blood sugar is checked sooner than 2 hours after largest meal, and it will be  expected to be elevated. You must wait 2 hours.   If your blood sugar is less than 180 hours after your largest meal, again we are reaching our target a1c goal   Call [facility] clinic if: BG < 70 or > 300.   If you have any symptoms of low blood sugar ( sweating, shakiness, lightheaded, dizzy) that you notify me. If you have a low, please drink a glass of orange juice and recheck blood sugar every 5 minutes until you don't feel symptomatic AND blood sugar is above 80.    Nice to see you today!

## 2023-06-20 ENCOUNTER — Encounter: Payer: Self-pay | Admitting: Family

## 2023-06-21 ENCOUNTER — Other Ambulatory Visit: Payer: Self-pay | Admitting: Family

## 2023-06-21 DIAGNOSIS — J029 Acute pharyngitis, unspecified: Secondary | ICD-10-CM

## 2023-06-21 NOTE — Progress Notes (Signed)
close

## 2023-08-16 ENCOUNTER — Ambulatory Visit: Payer: 59 | Admitting: Gastroenterology

## 2023-08-24 ENCOUNTER — Ambulatory Visit: Payer: 59 | Admitting: Family

## 2023-11-11 ENCOUNTER — Telehealth: Payer: Self-pay

## 2023-11-11 NOTE — Telephone Encounter (Signed)
 Copied from CRM [id number]. Topic: Medical Record Request - Other >> Nov 11, 2023  1:27 PM Terri Tanner wrote: Reason for CRM: [name] from [facility] states that she needs the MD notes stating she had a mastectomy done so they can complete the paperwork for her. They got the order, but need the notes.   Needing a MD Notes stating she had a mastectomy sent to [facility] on [address] for Right Breast prosthesis and Bra's.  Fax-[phone] [name]-[phone]

## 2023-11-16 NOTE — Telephone Encounter (Signed)
 Call medical supply store and verbally tell them pt has h/o right mastectomy   H/o right right breast cancer  May send notes from 04/14/24

## 2023-11-16 NOTE — Telephone Encounter (Signed)
 I do not see any notes from [facility] regarding her mastectomy.

## 2023-11-17 ENCOUNTER — Encounter: Payer: Self-pay | Admitting: Family

## 2023-11-17 NOTE — Telephone Encounter (Signed)
 Paperwork faxed

## 2024-03-08 ENCOUNTER — Other Ambulatory Visit: Payer: Self-pay | Admitting: Family

## 2024-03-08 DIAGNOSIS — Z1231 Encounter for screening mammogram for malignant neoplasm of breast: Secondary | ICD-10-CM

## 2024-04-18 ENCOUNTER — Encounter: Payer: 59 | Admitting: Family

## 2024-04-19 ENCOUNTER — Ambulatory Visit
Admission: RE | Admit: 2024-04-19 | Discharge: 2024-04-19 | Disposition: A | Discharge status: Home / Self Care | Source: Ambulatory Visit | Attending: Family | Admitting: Family

## 2024-04-19 DIAGNOSIS — Z1231 Encounter for screening mammogram for malignant neoplasm of breast: Secondary | ICD-10-CM | POA: Insufficient documentation

## 2024-04-28 ENCOUNTER — Encounter: Payer: Self-pay | Admitting: Pharmacist

## 2024-04-28 NOTE — Progress Notes (Signed)
 Pharmacy Quality Measure Review  This patient is appearing on a report for being at risk of failing the Glycemic Status Assessment in Diabetes and Kidney Health Evaluation for Patients with Diabetes measure this calendar year.   Last documented A1c = 2024 Lab Results  Component Value Date   HGBA1C 7.0 (H) 04/15/2023   Last documented A1c = 2023 Lab Results  Component Value Date   MICRALBCREAT 5 05/11/2022    Last PCP visit 2024 with documented plan for 47-month follow up.  Needs follow up, routed scheduling pool.

## 2024-07-24 NOTE — Progress Notes (Signed)
 Terri Tanner                                          MRN: [id number]   07/24/2024   The [facility] Quality Team Specialist reviewed this patient medical record for the purposes of chart review for care gap closure. The following were reviewed: chart review for care gap closure-glycemic status assessment.    [facility] Quality Team
